# Patient Record
Sex: Female | Born: 1955 | Race: White | Hispanic: No | Marital: Married | Smoking: Never smoker
Health system: Southern US, Community
[De-identification: ages and names within clinical notes are randomized; demographics above are authoritative.]

## PROBLEM LIST (undated history)

## (undated) DIAGNOSIS — IMO0002 Reserved for concepts with insufficient information to code with codable children: Secondary | ICD-10-CM

## (undated) DIAGNOSIS — C541 Malignant neoplasm of endometrium: Secondary | ICD-10-CM

## (undated) DIAGNOSIS — N809 Endometriosis, unspecified: Secondary | ICD-10-CM

## (undated) DIAGNOSIS — K229 Disease of esophagus, unspecified: Secondary | ICD-10-CM

## (undated) DIAGNOSIS — B029 Zoster without complications: Secondary | ICD-10-CM

## (undated) DIAGNOSIS — R87619 Unspecified abnormal cytological findings in specimens from cervix uteri: Secondary | ICD-10-CM

## (undated) DIAGNOSIS — L4050 Arthropathic psoriasis, unspecified: Secondary | ICD-10-CM

## (undated) HISTORY — PX: APPENDECTOMY: SHX54

## (undated) HISTORY — DX: Zoster without complications: B02.9

## (undated) HISTORY — DX: Arthropathic psoriasis, unspecified: L40.50

## (undated) HISTORY — PX: LIVER BIOPSY: SHX301

## (undated) HISTORY — DX: Endometriosis, unspecified: N80.9

## (undated) HISTORY — DX: Unspecified abnormal cytological findings in specimens from cervix uteri: R87.619

## (undated) HISTORY — PX: OTHER SURGICAL HISTORY: SHX169

## (undated) HISTORY — PX: NOSE SURGERY: SHX723

## (undated) HISTORY — PX: EXPLORATORY LAPAROTOMY: SUR591

## (undated) HISTORY — PX: KNEE ARTHROPLASTY: SHX992

## (undated) HISTORY — PX: CATARACT EXTRACTION, BILATERAL: SHX1313

## (undated) HISTORY — DX: Reserved for concepts with insufficient information to code with codable children: IMO0002

## (undated) HISTORY — PX: LAPAROSCOPY: SHX197

## (undated) HISTORY — PX: KNEE SURGERY: SHX244

## (undated) HISTORY — PX: ABDOMINAL HYSTERECTOMY: SHX81

---

## 1998-05-12 ENCOUNTER — Other Ambulatory Visit: Admission: RE | Admit: 1998-05-12 | Discharge: 1998-05-12 | Payer: Self-pay | Admitting: Obstetrics and Gynecology

## 1999-06-13 ENCOUNTER — Encounter: Payer: Self-pay | Admitting: Obstetrics and Gynecology

## 1999-06-13 ENCOUNTER — Encounter: Admission: RE | Admit: 1999-06-13 | Discharge: 1999-06-13 | Payer: Self-pay | Admitting: Obstetrics and Gynecology

## 1999-08-22 ENCOUNTER — Other Ambulatory Visit: Admission: RE | Admit: 1999-08-22 | Discharge: 1999-08-22 | Payer: Self-pay | Admitting: Obstetrics and Gynecology

## 1999-08-26 ENCOUNTER — Encounter: Admission: RE | Admit: 1999-08-26 | Discharge: 1999-08-26 | Payer: Self-pay | Admitting: Obstetrics and Gynecology

## 1999-08-26 ENCOUNTER — Encounter: Payer: Self-pay | Admitting: Obstetrics and Gynecology

## 2000-06-15 ENCOUNTER — Encounter: Payer: Self-pay | Admitting: Obstetrics and Gynecology

## 2000-06-15 ENCOUNTER — Encounter: Admission: RE | Admit: 2000-06-15 | Discharge: 2000-06-15 | Payer: Self-pay | Admitting: Obstetrics and Gynecology

## 2001-09-19 ENCOUNTER — Encounter: Payer: Self-pay | Admitting: Obstetrics and Gynecology

## 2001-09-19 ENCOUNTER — Encounter: Admission: RE | Admit: 2001-09-19 | Discharge: 2001-09-19 | Payer: Self-pay | Admitting: Obstetrics and Gynecology

## 2002-02-06 HISTORY — PX: OTHER SURGICAL HISTORY: SHX169

## 2002-03-14 ENCOUNTER — Encounter: Admission: RE | Admit: 2002-03-14 | Discharge: 2002-03-14 | Payer: Self-pay | Admitting: Orthopedic Surgery

## 2002-03-14 ENCOUNTER — Encounter: Payer: Self-pay | Admitting: Orthopedic Surgery

## 2002-03-15 ENCOUNTER — Encounter: Admission: RE | Admit: 2002-03-15 | Discharge: 2002-03-15 | Payer: Self-pay | Admitting: Orthopedic Surgery

## 2002-03-15 ENCOUNTER — Encounter: Payer: Self-pay | Admitting: Orthopedic Surgery

## 2002-10-24 ENCOUNTER — Encounter: Admission: RE | Admit: 2002-10-24 | Discharge: 2002-10-24 | Payer: Self-pay | Admitting: Obstetrics and Gynecology

## 2002-10-24 ENCOUNTER — Encounter: Payer: Self-pay | Admitting: Obstetrics and Gynecology

## 2003-06-29 ENCOUNTER — Ambulatory Visit (HOSPITAL_COMMUNITY): Admission: RE | Admit: 2003-06-29 | Discharge: 2003-06-29 | Payer: Self-pay | Admitting: Gastroenterology

## 2003-12-02 ENCOUNTER — Ambulatory Visit (HOSPITAL_COMMUNITY): Admission: RE | Admit: 2003-12-02 | Discharge: 2003-12-02 | Payer: Self-pay | Admitting: Obstetrics and Gynecology

## 2003-12-21 ENCOUNTER — Encounter: Admission: RE | Admit: 2003-12-21 | Discharge: 2003-12-21 | Payer: Self-pay | Admitting: Obstetrics and Gynecology

## 2004-12-15 ENCOUNTER — Ambulatory Visit (HOSPITAL_COMMUNITY): Admission: RE | Admit: 2004-12-15 | Discharge: 2004-12-15 | Payer: Self-pay | Admitting: Obstetrics and Gynecology

## 2005-12-26 ENCOUNTER — Ambulatory Visit (HOSPITAL_COMMUNITY): Admission: RE | Admit: 2005-12-26 | Discharge: 2005-12-26 | Payer: Self-pay | Admitting: Interventional Cardiology

## 2006-03-05 ENCOUNTER — Ambulatory Visit (HOSPITAL_COMMUNITY): Admission: RE | Admit: 2006-03-05 | Discharge: 2006-03-05 | Payer: Self-pay | Admitting: Obstetrics and Gynecology

## 2006-08-28 ENCOUNTER — Ambulatory Visit (HOSPITAL_COMMUNITY): Admission: RE | Admit: 2006-08-28 | Discharge: 2006-08-28 | Payer: Self-pay | Admitting: Rheumatology

## 2006-08-28 ENCOUNTER — Encounter (INDEPENDENT_AMBULATORY_CARE_PROVIDER_SITE_OTHER): Payer: Self-pay | Admitting: Interventional Radiology

## 2006-12-31 ENCOUNTER — Ambulatory Visit (HOSPITAL_COMMUNITY): Admission: RE | Admit: 2006-12-31 | Discharge: 2006-12-31 | Payer: Self-pay | Admitting: Obstetrics and Gynecology

## 2008-01-06 ENCOUNTER — Ambulatory Visit (HOSPITAL_COMMUNITY): Admission: RE | Admit: 2008-01-06 | Discharge: 2008-01-06 | Payer: Self-pay | Admitting: Obstetrics and Gynecology

## 2008-11-24 ENCOUNTER — Ambulatory Visit (HOSPITAL_COMMUNITY): Admission: RE | Admit: 2008-11-24 | Discharge: 2008-11-24 | Payer: Self-pay | Admitting: Obstetrics and Gynecology

## 2008-11-24 ENCOUNTER — Encounter: Payer: Self-pay | Admitting: Obstetrics and Gynecology

## 2008-11-24 HISTORY — PX: BILATERAL SALPINGOOPHORECTOMY: SHX1223

## 2009-01-08 ENCOUNTER — Ambulatory Visit (HOSPITAL_COMMUNITY): Admission: RE | Admit: 2009-01-08 | Discharge: 2009-01-08 | Payer: Self-pay | Admitting: Obstetrics and Gynecology

## 2010-01-12 ENCOUNTER — Ambulatory Visit (HOSPITAL_COMMUNITY)
Admission: RE | Admit: 2010-01-12 | Discharge: 2010-01-12 | Payer: Self-pay | Source: Home / Self Care | Attending: Obstetrics and Gynecology | Admitting: Obstetrics and Gynecology

## 2010-02-26 ENCOUNTER — Encounter: Payer: Self-pay | Admitting: Obstetrics and Gynecology

## 2010-05-12 LAB — CBC
HCT: 43.2 % (ref 36.0–46.0)
Hemoglobin: 15 g/dL (ref 12.0–15.0)
MCHC: 34.8 g/dL (ref 30.0–36.0)
MCV: 90.3 fL (ref 78.0–100.0)
Platelets: 181 10*3/uL (ref 150–400)
RBC: 4.78 MIL/uL (ref 3.87–5.11)
RDW: 14.8 % (ref 11.5–15.5)
WBC: 5.9 10*3/uL (ref 4.0–10.5)

## 2010-05-12 LAB — ABO/RH: ABO/RH(D): B NEG

## 2010-05-12 LAB — TYPE AND SCREEN
ABO/RH(D): B NEG
Antibody Screen: NEGATIVE

## 2010-05-12 LAB — COMPREHENSIVE METABOLIC PANEL
ALT: 33 U/L (ref 0–35)
AST: 28 U/L (ref 0–37)
Albumin: 4 g/dL (ref 3.5–5.2)
Alkaline Phosphatase: 90 U/L (ref 39–117)
BUN: 10 mg/dL (ref 6–23)
CO2: 30 mEq/L (ref 19–32)
Calcium: 9.6 mg/dL (ref 8.4–10.5)
Chloride: 106 mEq/L (ref 96–112)
Creatinine, Ser: 0.68 mg/dL (ref 0.4–1.2)
GFR calc Af Amer: >60 mL/min (ref >60)
GFR calc non Af Amer: >60 mL/min (ref >60)
Glucose, Bld: 88 mg/dL (ref 70–99)
Potassium: 4.8 mEq/L (ref 3.5–5.1)
Sodium: 141 mEq/L (ref 135–145)
Total Bilirubin: 0.8 mg/dL (ref 0.3–1.2)
Total Protein: 7.3 g/dL (ref 6.0–8.3)

## 2010-06-06 ENCOUNTER — Emergency Department (HOSPITAL_COMMUNITY): Payer: BC Managed Care – PPO

## 2010-06-06 ENCOUNTER — Emergency Department (HOSPITAL_COMMUNITY)
Admission: EM | Admit: 2010-06-06 | Discharge: 2010-06-06 | Disposition: A | Discharge status: Home / Self Care | Payer: BC Managed Care – PPO | Attending: Emergency Medicine | Admitting: Emergency Medicine

## 2010-06-06 DIAGNOSIS — R079 Chest pain, unspecified: Secondary | ICD-10-CM | POA: Insufficient documentation

## 2010-06-06 DIAGNOSIS — R209 Unspecified disturbances of skin sensation: Secondary | ICD-10-CM | POA: Insufficient documentation

## 2010-06-06 LAB — POCT CARDIAC MARKERS
CKMB, poc: 1.1 ng/mL (ref 1.0–8.0)
Troponin i, poc: <0.05 ng/mL (ref 0.00–0.09)
Troponin i, poc: <0.05 ng/mL (ref 0.00–0.09)

## 2010-06-06 LAB — DIFFERENTIAL
Basophils Relative: 0 % (ref 0–1)
Eosinophils Absolute: 0.1 10*3/uL (ref 0.0–0.7)
Lymphocytes Relative: 35 % (ref 12–46)
Lymphs Abs: 2.5 10*3/uL (ref 0.7–4.0)
Monocytes Absolute: 0.7 10*3/uL (ref 0.1–1.0)
Monocytes Relative: 10 % (ref 3–12)
Neutro Abs: 3.9 10*3/uL (ref 1.7–7.7)
Neutrophils Relative %: 54 % (ref 43–77)

## 2010-06-06 LAB — POCT I-STAT, CHEM 8
BUN: 15 mg/dL (ref 6–23)
Calcium, Ion: 1.13 mmol/L (ref 1.12–1.32)
Chloride: 107 mEq/L (ref 96–112)
Creatinine, Ser: 0.9 mg/dL (ref 0.4–1.2)
Glucose, Bld: 83 mg/dL (ref 70–99)
HCT: 44 % (ref 36.0–46.0)
Hemoglobin: 15 g/dL (ref 12.0–15.0)
Potassium: 4.2 mEq/L (ref 3.5–5.1)
Sodium: 142 mEq/L (ref 135–145)
TCO2: 26 mmol/L (ref 0–100)

## 2010-06-06 LAB — CBC
HCT: 43.3 % (ref 36.0–46.0)
Hemoglobin: 15.1 g/dL — ABNORMAL HIGH (ref 12.0–15.0)
MCH: 31.6 pg (ref 26.0–34.0)
MCV: 90.6 fL (ref 78.0–100.0)
Platelets: 236 10*3/uL (ref 150–400)
RBC: 4.78 MIL/uL (ref 3.87–5.11)
RDW: 15.4 % (ref 11.5–15.5)
WBC: 7.2 10*3/uL (ref 4.0–10.5)

## 2010-06-21 ENCOUNTER — Ambulatory Visit (HOSPITAL_COMMUNITY): Payer: BC Managed Care – PPO | Attending: Family Medicine | Admitting: Radiology

## 2010-06-21 VITALS — Ht 62.0 in | Wt 178.0 lb

## 2010-06-21 DIAGNOSIS — R0609 Other forms of dyspnea: Secondary | ICD-10-CM

## 2010-06-21 DIAGNOSIS — R0602 Shortness of breath: Secondary | ICD-10-CM | POA: Insufficient documentation

## 2010-06-21 DIAGNOSIS — R0989 Other specified symptoms and signs involving the circulatory and respiratory systems: Secondary | ICD-10-CM

## 2010-06-21 DIAGNOSIS — R079 Chest pain, unspecified: Secondary | ICD-10-CM | POA: Insufficient documentation

## 2010-06-21 MED ORDER — TECHNETIUM TC 99M TETROFOSMIN IV KIT
33.0000 | PACK | Freq: Once | INTRAVENOUS | Status: AC | PRN
  Administered 2010-06-21: 33 via INTRAVENOUS

## 2010-06-21 MED ORDER — TECHNETIUM TC 99M TETROFOSMIN IV KIT
11.0000 | PACK | Freq: Once | INTRAVENOUS | Status: AC | PRN
  Administered 2010-06-21: 11 via INTRAVENOUS

## 2010-06-21 NOTE — Progress Notes (Signed)
[facility] SITE 3 NUCLEAR MED [address] [place] [address] [phone]  Cardiology Nuclear Med Study  Cynthia Snyder is a 55 y.o. female [id number] [birth year]   Nuclear Med Background Indication for Stress Test:  Evaluation for Ischemia, [facility] ED on 06/06/10 CP, (-) enzymes History:  No previous documented CAD Cardiac Risk Factors: Family History - CAD and Obesity  Symptoms:  Chest Pain>(L) arm numbness (last episode of chest discomfort has been none since discharge), Diaphoresis, DOE, Nausea and SOB with CP   Nuclear Pre-Procedure Caffeine/Decaff Intake:  None NPO After: 8:30pm   Lungs:  Clear. IV 0.9% NS with Angio Cath:  22g  IV Site: R Antecubital  IV Started by:  Irean Hong, RN  Chest Size (in):  38 Cup Size: DD  Height: 5\' 2"  (1.575 m)  Weight:  178 lb (80.74 kg)  BMI:  Body mass index is 32.56 kg/(m^2). Tech Comments:  Patient c/o heaviness in left leg with walking on the treadmill.    Nuclear Med Study 1 or 2 day study: 1 day  Stress Test Type:  Stress  Reading MD: Marca Ancona, MD  Order Authorizing Provider:  Antony Haste, MD  Resting Radionuclide: Technetium 6m Tetrofosmin  Resting Radionuclide Dose: 11 mCi   Stress Radionuclide:  Technetium 53m Tetrofosmin  Stress Radionuclide Dose: 33 mCi           Stress Protocol Rest HR: 80 Stress HR: 157  Rest BP: 119/86 Stress BP: 159/86 and 154/101  Exercise Time (min): 9:30 METS: 11.1   Predicted Max HR: 165 bpm % Max HR: 95.15 bpm Rate Pressure Product: 21308   Dose of Adenosine (mg):  n/a Dose of Lexiscan: n/a mg  Dose of Atropine (mg): n/a Dose of Dobutamine: n/a mcg/kg/min (at max HR)  Stress Test Technologist: Smiley Houseman, CMA-N  Nuclear Technologist:  Doyne Keel, CNMT     Rest Procedure:  Myocardial perfusion imaging was performed at rest 45 minutes following the intravenous administration of Technetium 24m Tetrofosmin.  Rest ECG: No acute changes.  Stress  Procedure:  The patient exercised for 9:30 on the treadmill utilizing the Bruce protocol.  The patient stopped due to fatigue and (L) leg heaviness.  She denied any chest pain.  There were no significant ST-T wave changes.  Technetium 25m Tetrofosmin was injected at peak exercise and myocardial perfusion imaging was performed after a brief delay.  Stress ECG: No significant change from baseline ECG  QPS Raw Data Images:  Normal; no motion artifact; normal heart/lung ratio. Stress Images:  Normal homogeneous uptake in all areas of the myocardium. Rest Images:  Normal homogeneous uptake in all areas of the myocardium. Subtraction (SDS):  Normal Transient Ischemic Dilatation (Normal <1.22):  0.96 Lung/Heart Ratio (Normal <0.45):  0.41  Quantitative Gated Spect Images QGS EDV:  51 ml QGS ESV:  15 ml QGS cine images:  NL LV Function; NL Wall Motion QGS EF: 71%  Impression Exercise Capacity:  Good exercise capacity. BP Response:  Normal blood pressure response. Clinical Symptoms:  No chest pain. ECG Impression:  No significant ST segment change suggestive of ischemia. Comparison with Prior Nuclear Study: No previous nuclear study performed  Overall Impression:  Normal stress nuclear study.     Cynthia Snyder

## 2010-06-22 NOTE — Progress Notes (Signed)
Copy faxed to Dr. Cyndia Bent.Cynthia Snyder

## 2010-06-24 NOTE — Op Note (Signed)
Cynthia Snyder, Cynthia Snyder                        ACCOUNT NO.:  [id number]   MEDICAL RECORD NO.:  [id number]                   PATIENT TYPE:  AMB   LOCATION:  ENDO                                 FACILITY:  [facility]   PHYSICIAN:  John C. Madilyn Fireman, M.D.                 DATE OF BIRTH:  11/18/1955   DATE OF PROCEDURE:  06/29/2003  DATE OF DISCHARGE:                                 OPERATIVE REPORT   PROCEDURE:  Colonoscopy.   INDICATIONS FOR PROCEDURE:  History of colon polyps in a first-degree  relative and colon cancer in two second-degree relatives.   DESCRIPTION OF PROCEDURE:  The patient was placed in the left lateral  decubitus position and placed on the pulse monitor with continuous low-flow  oxygen delivered by nasal cannula.  She was sedated with 87.5 mcg IV  fentanyl and 8 mg IV Versed.  The Olympus video colonoscope was inserted  into the rectum and advanced to the cecum, confirmed by transillumination of  McBurney's point and visualization of the ileocecal valve and appendiceal  orifice.  Prep was excellent.  The cecum, ascending, transverse, descending,  and sigmoid colon all appeared normal with no masses, polyps, diverticula,  or other mucosal abnormalities.  The rectum likewise appeared normal and  retroflexed view of the anus revealed small internal hemorrhoids.  The scope  was then withdrawn and the patient returned to the recovery room in stable  condition.  She tolerated the procedure well and there were no immediate  complications.   IMPRESSION:  Internal hemorrhoids; otherwise normal study.   PLAN:  Repeat colonoscopy in five years.                                               John C. Madilyn Fireman, M.D.    JCH/MEDQ  D:  06/29/2003  T:  06/29/2003  Job:  [id number]   cc:   Teena Irani. Arlyce Dice, M.D.  [address]  [place]  [place] [address]  Fax: [phone]

## 2010-11-21 LAB — CBC
HCT: 39.9
Hemoglobin: 13.6
MCHC: 34
MCV: 87.9
Platelets: 249
RBC: 4.54
RDW: 17.3 — ABNORMAL HIGH
WBC: 8.4

## 2010-11-21 LAB — PROTIME-INR
INR: 0.9
Prothrombin Time: 12.5

## 2010-12-13 ENCOUNTER — Other Ambulatory Visit: Payer: Self-pay | Admitting: Obstetrics and Gynecology

## 2010-12-13 DIAGNOSIS — Z13820 Encounter for screening for osteoporosis: Secondary | ICD-10-CM

## 2010-12-13 DIAGNOSIS — Z1231 Encounter for screening mammogram for malignant neoplasm of breast: Secondary | ICD-10-CM

## 2011-01-17 ENCOUNTER — Ambulatory Visit (HOSPITAL_COMMUNITY)
Admission: RE | Admit: 2011-01-17 | Discharge: 2011-01-17 | Disposition: A | Discharge status: Home / Self Care | Payer: BC Managed Care – PPO | Source: Ambulatory Visit | Attending: Obstetrics and Gynecology | Admitting: Obstetrics and Gynecology

## 2011-01-17 DIAGNOSIS — Z1231 Encounter for screening mammogram for malignant neoplasm of breast: Secondary | ICD-10-CM

## 2011-01-17 DIAGNOSIS — Z13820 Encounter for screening for osteoporosis: Secondary | ICD-10-CM | POA: Insufficient documentation

## 2011-01-17 DIAGNOSIS — Z780 Asymptomatic menopausal state: Secondary | ICD-10-CM | POA: Insufficient documentation

## 2011-02-03 ENCOUNTER — Encounter: Payer: Self-pay | Admitting: Emergency Medicine

## 2011-02-03 ENCOUNTER — Other Ambulatory Visit: Payer: Self-pay

## 2011-02-03 ENCOUNTER — Emergency Department (HOSPITAL_COMMUNITY)
Admission: EM | Admit: 2011-02-03 | Discharge: 2011-02-03 | Disposition: A | Discharge status: Home / Self Care | Payer: BC Managed Care – PPO | Attending: Emergency Medicine | Admitting: Emergency Medicine

## 2011-02-03 ENCOUNTER — Emergency Department (HOSPITAL_COMMUNITY): Payer: BC Managed Care – PPO

## 2011-02-03 DIAGNOSIS — R61 Generalized hyperhidrosis: Secondary | ICD-10-CM | POA: Insufficient documentation

## 2011-02-03 DIAGNOSIS — R079 Chest pain, unspecified: Secondary | ICD-10-CM | POA: Insufficient documentation

## 2011-02-03 DIAGNOSIS — Z79899 Other long term (current) drug therapy: Secondary | ICD-10-CM | POA: Insufficient documentation

## 2011-02-03 DIAGNOSIS — R1110 Vomiting, unspecified: Secondary | ICD-10-CM | POA: Insufficient documentation

## 2011-02-03 DIAGNOSIS — R1013 Epigastric pain: Secondary | ICD-10-CM

## 2011-02-03 DIAGNOSIS — K3189 Other diseases of stomach and duodenum: Secondary | ICD-10-CM | POA: Insufficient documentation

## 2011-02-03 HISTORY — DX: Malignant neoplasm of endometrium: C54.1

## 2011-02-03 HISTORY — DX: Disease of esophagus, unspecified: K22.9

## 2011-02-03 LAB — POCT I-STAT, CHEM 8
BUN: 18 mg/dL (ref 6–23)
Calcium, Ion: 1.21 mmol/L (ref 1.12–1.32)
Chloride: 106 mEq/L (ref 96–112)
Creatinine, Ser: 0.9 mg/dL (ref 0.50–1.10)
Glucose, Bld: 92 mg/dL (ref 70–99)
HCT: 47 % — ABNORMAL HIGH (ref 36.0–46.0)
Hemoglobin: 16 g/dL — ABNORMAL HIGH (ref 12.0–15.0)
Potassium: 4.3 mEq/L (ref 3.5–5.1)
Sodium: 144 mEq/L (ref 135–145)
TCO2: 27 mmol/L (ref 0–100)

## 2011-02-03 LAB — DIFFERENTIAL
Basophils Absolute: 0 10*3/uL (ref 0.0–0.1)
Basophils Relative: 0 % (ref 0–1)
Monocytes Absolute: 0.6 10*3/uL (ref 0.1–1.0)
Neutro Abs: 7 10*3/uL (ref 1.7–7.7)

## 2011-02-03 LAB — POCT I-STAT TROPONIN I
Troponin i, poc: 0 ng/mL (ref 0.00–0.08)
Troponin i, poc: 0 ng/mL (ref 0.00–0.08)

## 2011-02-03 LAB — CBC
HCT: 44.6 % (ref 36.0–46.0)
MCHC: 34.5 g/dL (ref 30.0–36.0)
Platelets: 201 10*3/uL (ref 150–400)
RDW: 15.5 % (ref 11.5–15.5)
WBC: 9 10*3/uL (ref 4.0–10.5)

## 2011-02-03 MED ORDER — GI COCKTAIL ~~LOC~~
30.0000 mL | Freq: Once | ORAL | Status: AC
  Administered 2011-02-03: 30 mL via ORAL
  Filled 2011-02-03: qty 30

## 2011-02-03 MED ORDER — MORPHINE SULFATE 4 MG/ML IJ SOLN
4.0000 mg | Freq: Once | INTRAMUSCULAR | Status: AC
  Administered 2011-02-03: 4 mg via INTRAVENOUS
  Filled 2011-02-03: qty 1

## 2011-02-03 MED ORDER — SODIUM CHLORIDE 0.9 % IV SOLN
Freq: Once | INTRAVENOUS | Status: AC
  Administered 2011-02-03 (×2): via INTRAVENOUS

## 2011-02-03 MED ORDER — ONDANSETRON HCL 4 MG/2ML IJ SOLN
4.0000 mg | Freq: Once | INTRAMUSCULAR | Status: AC
  Administered 2011-02-03: 4 mg via INTRAVENOUS

## 2011-02-03 MED ORDER — ONDANSETRON HCL 4 MG/2ML IJ SOLN
INTRAMUSCULAR | Status: AC
  Administered 2011-02-03: 4 mg via INTRAVENOUS
  Filled 2011-02-03: qty 2

## 2011-02-03 MED ORDER — ONDANSETRON HCL 4 MG/2ML IJ SOLN
4.0000 mg | Freq: Once | INTRAMUSCULAR | Status: DC
  Filled 2011-02-03: qty 2

## 2011-02-03 MED ORDER — OMEPRAZOLE 20 MG PO CPDR: DELAYED_RELEASE_CAPSULE | ORAL | Status: DC

## 2011-02-03 NOTE — ED Notes (Signed)
Pt is completely pain free at this time with no N/V/D.  BRAT diet recommended and explained to pt with verbal understanding expressed.

## 2011-02-03 NOTE — ED Provider Notes (Signed)
History     CSN: [id number]  Arrival date & time 02/03/11  1048   First MD Initiated Contact with Patient 02/03/11 1109      Chief Complaint  Patient presents with  . Emesis    (Consider location/radiation/quality/duration/timing/severity/associated sxs/prior treatment) Patient is a 55 y.o. female presenting with vomiting and chest pain. The history is provided by the patient.  Emesis  This is a new problem. The current episode started 3 to 5 hours ago. The problem occurs 2 to 4 times per day. The problem has not changed since onset.There has been no fever. Pertinent negatives include no abdominal pain.  Chest Pain The chest pain began 3 - 5 hours ago. Chest pain occurs constantly. The chest pain is unchanged. Associated with: Sharp right sided chest pain that radiates to lateral chest wall and under breast. The pain is currently at 9/10. The quality of the pain is described as sharp. Primary symptoms include vomiting. Pertinent negatives for primary symptoms include no abdominal pain.  Associated symptoms include diaphoresis.  Her family medical history is significant for CAD in family.     Past Medical History  Diagnosis Date  . Endometrial cancer   . Esophagus disorder     Past Surgical History  Procedure Date  . Appendectomy   . Abdominal hysterectomy   . Knee surgery   . Exploratory laparotomy     History reviewed. No pertinent family history.  History  Substance Use Topics  . Smoking status: Never Smoker   . Smokeless tobacco: Never Used  . Alcohol Use: No    OB History    Grav Para Term Preterm Abortions TAB SAB Ect Mult Living                  Review of Systems  Constitutional: Positive for diaphoresis.  HENT: Negative.   Eyes: Negative.   Respiratory: Negative.   Cardiovascular: Positive for chest pain. Negative for leg swelling.  Gastrointestinal: Positive for vomiting. Negative for abdominal pain.  Musculoskeletal: Negative.   Skin: Negative.      Allergies  Review of patient's allergies indicates no known allergies.  Home Medications   Current Outpatient Rx  Name Route Sig Dispense Refill  . CALCIUM CARBONATE 1250 MG PO TABS Oral Take 1 tablet by mouth daily.      . CYCLOBENZAPRINE HCL 10 MG PO TABS Oral Take 10 mg by mouth at bedtime as needed. For sleep     . ETANERCEPT 50 MG/ML [place] SOLN Subcutaneous Inject 50 mg into the skin once a week. On wednesday     . FOLIC ACID 400 MCG PO TABS Oral Take 400 mcg by mouth daily.      . IBUPROFEN 200 MG PO TABS Oral Take 400 mg by mouth every 6 (six) hours as needed. For pain     . METHOTREXATE (ANTI-RHEUMATIC) 2.5 MG PO TABS Oral Take 7.5-10 mg by mouth once a week. Pt takes 4 tablets on Saturday and 3 tablets on sunday     . ADULT MULTIVITAMIN W/MINERALS CH Oral Take 1 tablet by mouth daily.      Marland Kitchen VITAMIN D (ERGOCALCIFEROL) 50000 UNITS PO CAPS Oral Take 50,000 Units by mouth once a week. On saturday       BP 132/99  Temp(Src) 97.7 F (36.5 C) (Oral)  Resp 16  SpO2 99%  Physical Exam  Constitutional: She appears well-developed and well-nourished.       Patient appears uncomfortable.  HENT:  Head: Normocephalic.  Neck: Normal range of motion. Neck supple.  Cardiovascular: Normal rate and regular rhythm.   Pulmonary/Chest: Effort normal and breath sounds normal.  Abdominal: Soft. Bowel sounds are normal. There is no tenderness. There is no rebound and no guarding.  Musculoskeletal: Normal range of motion.  Neurological: She is alert. No cranial nerve deficit.  Skin: Skin is warm and dry. No rash noted.  Psychiatric: She has a normal mood and affect.    ED Course  Procedures (including critical care time)  Labs Reviewed - No data to display No results found.   No diagnosis found.    MDM   Date: 02/03/2011  Rate: 98  Rhythm: normal sinus rhythm  QRS Axis: normal  Intervals: normal  ST/T Wave abnormalities: normal  Conduction Disutrbances:none  Narrative  Interpretation:   Old EKG Reviewed: unchanged  Patient's pain was completely relieved with GI Cocktail. She has been pain free since. Second Troponin negative. Will discharge home.   Rodena Medin, PA 02/03/11 1520

## 2011-02-03 NOTE — ED Notes (Signed)
Received pt from home with c/o pain in diaphragm and feeling like something is stuck in her throat. Pt actively vomiting in room. Pt given 4mg  of Zofran by EMS.

## 2011-02-03 NOTE — ED Notes (Signed)
Pt reports complete relief.  Denies N/V at this time.  No verbal complaints at this time.

## 2011-02-03 NOTE — ED Notes (Addendum)
Pt reports complete relief after GI cocktail. She denies N/V/D at this time.  NAD

## 2011-02-03 NOTE — ED Notes (Signed)
Pt has no verbal complaints at this time and denies pain/discomfort.

## 2011-02-03 NOTE — ED Notes (Signed)
Pt semi fowlers.  VSS.  Family at [facility].

## 2011-02-03 NOTE — ED Notes (Signed)
IV access discontinued with catheter intact.  No redness or bleeding at site.  Pt denies pain/discomfort at this time.

## 2011-02-03 NOTE — ED Notes (Signed)
MD at bedside. 

## 2011-02-07 NOTE — ED Provider Notes (Signed)
Medical screening examination/treatment/procedure(s) were performed by non-physician practitioner and as supervising physician I was immediately available for consultation/collaboration.  Raeford Razor, MD 02/07/11 2050

## 2011-02-21 ENCOUNTER — Other Ambulatory Visit: Payer: Self-pay | Admitting: Otolaryngology

## 2011-02-21 DIAGNOSIS — R1314 Dysphagia, pharyngoesophageal phase: Secondary | ICD-10-CM

## 2011-02-21 DIAGNOSIS — R079 Chest pain, unspecified: Secondary | ICD-10-CM

## 2011-02-24 ENCOUNTER — Ambulatory Visit
Admission: RE | Admit: 2011-02-24 | Discharge: 2011-02-24 | Disposition: A | Discharge status: Home / Self Care | Payer: BC Managed Care – PPO | Source: Ambulatory Visit | Attending: Otolaryngology | Admitting: Otolaryngology

## 2011-02-24 DIAGNOSIS — R1314 Dysphagia, pharyngoesophageal phase: Secondary | ICD-10-CM

## 2011-02-24 DIAGNOSIS — R079 Chest pain, unspecified: Secondary | ICD-10-CM

## 2012-01-29 DIAGNOSIS — M1990 Unspecified osteoarthritis, unspecified site: Secondary | ICD-10-CM | POA: Insufficient documentation

## 2012-02-29 ENCOUNTER — Other Ambulatory Visit: Payer: Self-pay | Admitting: Obstetrics and Gynecology

## 2012-02-29 DIAGNOSIS — Z1231 Encounter for screening mammogram for malignant neoplasm of breast: Secondary | ICD-10-CM

## 2012-03-07 ENCOUNTER — Ambulatory Visit (HOSPITAL_COMMUNITY)
Admission: RE | Admit: 2012-03-07 | Discharge: 2012-03-07 | Disposition: A | Discharge status: Home / Self Care | Payer: BC Managed Care – PPO | Source: Ambulatory Visit | Attending: Obstetrics and Gynecology | Admitting: Obstetrics and Gynecology

## 2012-03-07 DIAGNOSIS — Z1231 Encounter for screening mammogram for malignant neoplasm of breast: Secondary | ICD-10-CM | POA: Insufficient documentation

## 2012-06-28 ENCOUNTER — Ambulatory Visit (INDEPENDENT_AMBULATORY_CARE_PROVIDER_SITE_OTHER): Payer: BC Managed Care – PPO | Admitting: Obstetrics and Gynecology

## 2012-06-28 ENCOUNTER — Encounter: Payer: Self-pay | Admitting: Obstetrics and Gynecology

## 2012-06-28 VITALS — BP 118/78 | Ht 61.75 in | Wt 183.0 lb

## 2012-06-28 DIAGNOSIS — Z01419 Encounter for gynecological examination (general) (routine) without abnormal findings: Secondary | ICD-10-CM

## 2012-06-28 DIAGNOSIS — Z0000 Encounter for general adult medical examination without abnormal findings: Secondary | ICD-10-CM

## 2012-06-28 LAB — POCT URINALYSIS DIPSTICK
Bilirubin, UA: NEGATIVE
Blood, UA: NEGATIVE
Glucose, UA: NEGATIVE
Ketones, UA: NEGATIVE
Urobilinogen, UA: NEGATIVE

## 2012-06-28 LAB — LIPID PANEL
Cholesterol: 167 mg/dL (ref 0–200)
HDL: 57 mg/dL (ref >39)
Total CHOL/HDL Ratio: 2.9 Ratio
Triglycerides: 89 mg/dL (ref <150)
VLDL: 18 mg/dL (ref 0–40)

## 2012-06-28 MED ORDER — ESTRADIOL 10 MCG VA TABS: 1.0000 | ORAL_TABLET | VAGINAL | Status: DC

## 2012-06-28 NOTE — Progress Notes (Signed)
57 y.o.   Married    Caucasian   female   G2P2002   here for annual exam.  Using vagifem once or twice a week.  Sex is infrequent but wants to continue vagirem  Patient's last menstrual period was 02/06/1982.          Sexually active: yes  The current method of family planning is status post hysterectomy and post menopausal status.    Exercising: walking 2 times a week when able Last mammogram:  03/07/12 normal Last pap smear:2001 History of abnormal pap: age 65 Smoking:never Alcohol: occ glass of wine Last colonoscopy:09/2008 polyps, repeat in 5 years Last Bone Density: 01/17/11 osteopenia  Last tetanus shot: less than 10 years Last cholesterol check: 2010  Hgb:       14.7         Urine:neg   Family History  Problem Relation Age of Onset  . Heart disease Mother   . Hypertension Mother   . Stroke Mother   . Heart disease Father   . Heart disease Brother   . Cancer Paternal Aunt     ovarian    There are no active problems to display for this patient.   Past Medical History  Diagnosis Date  . Endometrial cancer   . Esophagus disorder   . Abnormal Pap smear     many yrs ago  . Endometriosis     Past Surgical History  Procedure Laterality Date  . Appendectomy    . Knee surgery    . Exploratory laparotomy    . Bilateral salpingoophorectomy Right 11/24/08    lysis of adhesion  . Abdominal hysterectomy      age 1  . Laparoscopy      age 32  . Jaw bone graft  2004  . Liver biopsy      mild steatosis    Allergies: Review of patient's allergies indicates no known allergies.  Current Outpatient Prescriptions  Medication Sig Dispense Refill  . clonazePAM (KLONOPIN) 0.5 MG tablet Take 0.5 mg by mouth at bedtime as needed for anxiety.      . Estradiol (VAGIFEM) 10 MCG TABS Place vaginally 2 (two) times a week.      . etanercept (ENBREL) 50 MG/ML injection Inject 50 mg into the skin once a week. On wednesday       . folic acid (FOLVITE) 400 MCG tablet Take 400 mcg by  mouth daily.        Marland Kitchen ibuprofen (ADVIL,MOTRIN) 200 MG tablet Take 400 mg by mouth every 6 (six) hours as needed. For pain       . methotrexate (RHEUMATREX) 2.5 MG tablet 2.5 mg 2 (two) times a week.       . Multiple Vitamin (MULITIVITAMIN WITH MINERALS) TABS Take 1 tablet by mouth daily.        . Multiple Vitamins-Calcium (VIACTIV MULTI-VITAMIN) CHEW Chew by mouth daily.      . Vitamin D, Ergocalciferol, (DRISDOL) 50000 UNITS CAPS Take 50,000 Units by mouth every 14 (fourteen) days. On saturday      . omeprazole (PRILOSEC) 20 MG capsule Twice daily for 3 days, then once daily for one week.  15 capsule  0   No current facility-administered medications for this visit.    ROS: Pertinent items are noted in HPI.  Social Hx:  Married, two children, works as a Diplomatic Services operational officer, Psychologist, forensic for elderly parents in [place]  Exam:    BP 118/78  Ht 5' 1.75" (1.568 m)  Wt  183 lb (83.008 kg)  BMI 33.76 kg/m2  LMP 02/06/1982  Ht stable and wt down 2 pounds since last year Wt Readings from Last 3 Encounters:  06/28/12 183 lb (83.008 kg)  06/21/10 178 lb (80.74 kg)     Ht Readings from Last 3 Encounters:  06/28/12 5' 1.75" (1.568 m)  06/21/10 5\' 2"  (1.575 m)    General appearance: alert, cooperative and appears stated age Head: Normocephalic, without obvious abnormality, atraumatic Neck: no adenopathy, supple, symmetrical, trachea midline and thyroid not enlarged, symmetric, no tenderness/mass/nodules Lungs: clear to auscultation bilaterally Breasts: Inspection negative, No nipple retraction or dimpling, No nipple discharge or bleeding, No axillary or supraclavicular adenopathy, Normal to palpation without dominant masses Heart: regular rate and rhythm Abdomen: soft, non-tender; bowel sounds normal; no masses,  no organomegaly Extremities: extremities normal, atraumatic, no cyanosis or edema Skin: Skin color, texture, turgor normal. No rashes or lesions Lymph nodes: Cervical, supraclavicular, and  axillary nodes normal. No abnormal inguinal nodes palpated Neurologic: Grossly normal   Pelvic: External genitalia:  no lesions              Urethra:  normal appearing urethra with no masses, tenderness or lesions              Bartholins and Skenes: normal                 Vagina: normal appearing vagina with normal color and discharge, no lesions              Cervix: absent              Pap taken: no        Bimanual Exam:  Uterus:  absent                                      Adnexa: surgically absent;  No masses or tenderness                                      Rectovaginal: Confirms                                      Anus:  normal sphincter tone, no lesions  A: normal menopausal exam, no HRT     S/p TAH for endo, the R-BSO and LOA for pain     Psoriatic arthritis, freq steroid use     P: mammogram counseled on breast self exam, mammography screening, adequate intake of calcium and vitamin D, diet and exercise return annually or prn     An After Visit Summary was printed and given to the patient.

## 2012-06-28 NOTE — Patient Instructions (Addendum)

## 2012-08-26 ENCOUNTER — Other Ambulatory Visit: Payer: Self-pay | Admitting: Orthopedic Surgery

## 2012-08-26 DIAGNOSIS — M542 Cervicalgia: Secondary | ICD-10-CM

## 2012-08-31 ENCOUNTER — Ambulatory Visit
Admission: RE | Admit: 2012-08-31 | Discharge: 2012-08-31 | Disposition: A | Discharge status: Home / Self Care | Payer: BC Managed Care – PPO | Source: Ambulatory Visit | Attending: Orthopedic Surgery | Admitting: Orthopedic Surgery

## 2012-08-31 DIAGNOSIS — M542 Cervicalgia: Secondary | ICD-10-CM

## 2012-09-30 ENCOUNTER — Other Ambulatory Visit: Payer: Self-pay | Admitting: Obstetrics and Gynecology

## 2013-02-04 ENCOUNTER — Telehealth: Payer: Self-pay | Admitting: Nurse Practitioner

## 2013-02-04 NOTE — Telephone Encounter (Signed)
Thank you for setting up this appointment for me to see the patient.

## 2013-02-04 NOTE — Telephone Encounter (Signed)
Message left to return call to Cynthia Snyder at [phone].    

## 2013-02-04 NOTE — Telephone Encounter (Signed)
Spoke with patient. She is having L Breast Pain, has been going on for about a week. States it is "achey". Has had this issue prior and was Dx with a pulled muscle, patient thought this may be the same, but it has not improved with time. Offered office visit today, patient declines. States her first available time to come in will be 02/10/13. She previously saw Dr. Tresa Res. Patient scheduled for breast check with Dr. Edward Jolly for 02/10/13.  Patient agreeable and advised to call back if changes her mind for earlier appointment. Patient agreeable to plan.

## 2013-02-04 NOTE — Telephone Encounter (Signed)
Pt says her left breast has been hurting. Please call to advise.

## 2013-02-10 ENCOUNTER — Ambulatory Visit (INDEPENDENT_AMBULATORY_CARE_PROVIDER_SITE_OTHER): Payer: BC Managed Care – PPO | Admitting: Obstetrics and Gynecology

## 2013-02-10 ENCOUNTER — Encounter: Payer: Self-pay | Admitting: Obstetrics and Gynecology

## 2013-02-10 VITALS — BP 126/88 | HR 84 | Ht 61.75 in | Wt 182.0 lb

## 2013-02-10 DIAGNOSIS — N644 Mastodynia: Secondary | ICD-10-CM

## 2013-02-10 NOTE — Patient Instructions (Signed)
Breast Tenderness Breast tenderness is a common problem for women of all ages. Breast tenderness may cause mild discomfort to severe pain. It has a variety of causes. Your health care provider will find out the likely cause of your breast tenderness by examining your breasts, asking you about symptoms, and ordering some tests. Breast tenderness usually does not mean you have breast cancer. HOME CARE INSTRUCTIONS  Breast tenderness often can be handled at home. You can try:  Getting fitted for a new bra that provides more support, especially during exercise.  Wearing a more supportive bra or sports bra while sleeping when your breasts are very tender.  If you have a breast injury, apply ice to the area:  Put ice in a plastic bag.  Place a towel between your skin and the bag.  Leave the ice on for 20 minutes, 2 3 times a day.  If your breasts are too full of milk as a result of breastfeeding, try:  Expressing milk either by hand or with a breast pump.  Applying a warm compress to the breasts for relief.  Taking over-the-counter pain relievers, if approved by your health care provider.  Taking other medicines that your health care provider prescribes. These may include antibiotic medicines or birth control pills. Over the long term, your breast tenderness might be eased if you:  Cut down on caffeine.  Reduce the amount of fat in your diet. Keep a log of the days and times when your breasts are most tender. This will help you and your health care provider find the cause of the tenderness and how to relieve it. Also, learn how to do breast exams at home. This will help you notice if you have an unusual growth or lump that could cause tenderness. SEEK MEDICAL CARE IF:   Any part of your breast is hard, red, and hot to the touch. This could be a sign of infection.  Fluid is coming out of your nipples (and you are not breastfeeding). Especially watch for blood or pus.  You have a fever  as well as breast tenderness.  You have a new or painful lump in your breast that remains after your menstrual period ends.  You have tried to take care of the pain at home, but it has not gone away.  Your breast pain is getting worse, or the pain is making it hard to do the things you usually do during your day. Document Released: 01/06/2008 Document Revised: 09/25/2012 Document Reviewed: 08/22/2012 [employer] Patient Information 2014 [employer].  

## 2013-02-10 NOTE — Progress Notes (Signed)
Patient ID: Cynthia Snyder, female   DOB: Dec 17, 1955, 58 y.o.   MRN: [id number] GYNECOLOGY PROBLEM VISIT  PCP:   Anastasia Pall, MD  Referring provider:   HPI: 58 y.o.   Married  Caucasian  female   G2P2002 with Patient's last menstrual period was 02/06/1982.   here for   Left breast tenderness for 2 1/2 - 3 weeks. Lots of caffeine and chocolates.  No trauma. No excessive exercise. Doing self breast exam, feels tender but no lumps.   Advil helping some. Taking 200 mg at a time.  Last mammogram in January 2014 was normal.  On Vagifem vaginal estrogen.   Going to [place].     GYNECOLOGIC HISTORY: Patient's last menstrual period was 02/06/1982. Sexually active:  yes Partner preference: female Contraception:   TAH Menopausal hormone therapy: Vagifem DES exposure:   no Blood transfusions:  no  Sexually transmitted diseases:   no GYN Procedures:  TAH, Laparoscopy with BSO 2010, laparoscopy 2004 Mammogram: 03-07-12 wnl                Pap:  2001 wnl   History of abnormal pap smear:  At age 58   OB History   Grav Para Term Preterm Abortions TAB SAB Ect Mult Living   2 2 2       2          Family History  Problem Relation Age of Onset  . Heart disease Mother   . Hypertension Mother   . Stroke Mother   . Heart disease Father   . Heart disease Brother   . Cancer Paternal Aunt     ovarian    There are no active problems to display for this patient.   Past Medical History  Diagnosis Date  . Endometrial cancer   . Esophagus disorder   . Abnormal Pap smear     many yrs ago  . Endometriosis     Past Surgical History  Procedure Laterality Date  . Appendectomy    . Knee surgery    . Exploratory laparotomy    . Bilateral salpingoophorectomy Right 11/24/08    lysis of adhesion  . Abdominal hysterectomy      age 33  . Laparoscopy      age 53  . Jaw bone graft  2004  . Liver biopsy      mild steatosis    ALLERGIES: Review of patient's allergies indicates no  known allergies.  Current Outpatient Prescriptions  Medication Sig Dispense Refill  . cyclobenzaprine (FLEXERIL) 10 MG tablet Take 10 mg by mouth as needed for muscle spasms.      . Estradiol (VAGIFEM) 10 MCG TABS Place 1 tablet (10 mcg total) vaginally 2 (two) times a week.  8 tablet  12  . etanercept (ENBREL) 50 MG/ML injection Inject 50 mg into the skin once a week. On wednesday       . ibuprofen (ADVIL,MOTRIN) 200 MG tablet Take 400 mg by mouth every 6 (six) hours as needed. For pain       . methotrexate (RHEUMATREX) 2.5 MG tablet 2.5 mg 2 (two) times a week.       . Multiple Vitamin (MULITIVITAMIN WITH MINERALS) TABS Take 1 tablet by mouth daily.        . Multiple Vitamins-Calcium (VIACTIV MULTI-VITAMIN) CHEW Chew by mouth daily.      . Vitamin D, Ergocalciferol, (DRISDOL) 50000 UNITS CAPS capsule Take 1 capsule (50,000 Units total) by mouth every 14 (fourteen) days.  26 capsule  3   No current facility-administered medications for this visit.     ROS:  Pertinent items are noted in HPI.  SOCIAL HISTORY:    PHYSICAL EXAMINATION:    BP 126/88  Pulse 84  Ht 5' 1.75" (1.568 m)  Wt 182 lb (82.555 kg)  BMI 33.58 kg/m2  LMP 02/06/1982   Wt Readings from Last 3 Encounters:  02/10/13 182 lb (82.555 kg)  06/28/12 183 lb (83.008 kg)  06/21/10 178 lb (80.74 kg)     Ht Readings from Last 3 Encounters:  02/10/13 5' 1.75" (1.568 m)  06/28/12 5' 1.75" (1.568 m)  06/21/10 5\' 2"  (1.575 m)    General appearance: alert, cooperative and appears stated age Breasts - No dominant masses, skin retractions, nipple discharge or axillary adenopathy.  Neurologic: Grossly normal    ASSESSMENT  Left mastalgia. Caffeine use.    PLAN  Counseled on mastalgia. Diagnostic bilateral mammogram and left breast ultrasound. Try Vitamin E 400 IU daily to treat breast pain. Return prn.  15 minutes face to face time of which over 50% was spent in counseling.    An After Visit Summary was printed  and given to the patient.

## 2013-02-10 NOTE — Progress Notes (Signed)
Diag MMG scheduled for 03-10-13 at 0945 at [facility].  Due to patient's scheduling limitations, she declined appointment until first week of February.

## 2013-03-10 ENCOUNTER — Ambulatory Visit
Admission: RE | Admit: 2013-03-10 | Discharge: 2013-03-10 | Disposition: A | Discharge status: Home / Self Care | Payer: BC Managed Care – PPO | Source: Ambulatory Visit | Attending: Obstetrics and Gynecology | Admitting: Obstetrics and Gynecology

## 2013-03-10 DIAGNOSIS — N644 Mastodynia: Secondary | ICD-10-CM

## 2013-10-20 ENCOUNTER — Other Ambulatory Visit: Payer: Self-pay

## 2013-10-20 NOTE — Telephone Encounter (Signed)
lmom to contact office

## 2013-10-20 NOTE — Telephone Encounter (Signed)
Last AEX: 06/28/12 Last refill:09/30/12 #26 X 3 Current AEX:NS  Please advise

## 2013-10-22 NOTE — Telephone Encounter (Signed)
Appt made. I let pt know we will get bloodwork on that day to check her Vitamin D level. Then we will know if a rx is needed. Pt voiced understanding and agreed

## 2013-11-25 ENCOUNTER — Other Ambulatory Visit: Payer: Self-pay | Admitting: Certified Nurse Midwife

## 2013-11-25 ENCOUNTER — Telehealth: Payer: Self-pay | Admitting: Nurse Practitioner

## 2013-11-25 ENCOUNTER — Ambulatory Visit (INDEPENDENT_AMBULATORY_CARE_PROVIDER_SITE_OTHER): Payer: BC Managed Care – PPO | Admitting: Certified Nurse Midwife

## 2013-11-25 ENCOUNTER — Encounter: Payer: Self-pay | Admitting: Certified Nurse Midwife

## 2013-11-25 VITALS — BP 122/90 | HR 72 | Resp 18 | Ht 62.0 in | Wt 187.0 lb

## 2013-11-25 DIAGNOSIS — Z0000 Encounter for general adult medical examination without abnormal findings: Secondary | ICD-10-CM

## 2013-11-25 DIAGNOSIS — Z01419 Encounter for gynecological examination (general) (routine) without abnormal findings: Secondary | ICD-10-CM

## 2013-11-25 LAB — POCT URINALYSIS DIPSTICK
Bilirubin, UA: NEGATIVE
Blood, UA: NEGATIVE
Glucose, UA: NEGATIVE
Ketones, UA: NEGATIVE
NITRITE UA: NEGATIVE
PROTEIN UA: NEGATIVE
Urobilinogen, UA: NEGATIVE
pH, UA: 5

## 2013-11-25 LAB — HEMOGLOBIN, FINGERSTICK: HEMOGLOBIN, FINGERSTICK: 14.4 g/dL (ref 12.0–16.0)

## 2013-11-25 LAB — HEMOGLOBIN A1C
Hgb A1c MFr Bld: 5.2 % (ref <5.7)
Mean Plasma Glucose: 103 mg/dL (ref <117)

## 2013-11-25 LAB — TSH: TSH: 2.298 u[IU]/mL (ref 0.350–4.500)

## 2013-11-25 NOTE — Telephone Encounter (Signed)
Patient was here this morning.  Do you want to add Vitamin D to her labs? And then see if she needs Rx ?  Please advise.

## 2013-11-25 NOTE — Telephone Encounter (Signed)
Yes

## 2013-11-25 NOTE — Progress Notes (Signed)
58 y.o. G27P2002 Married Caucasian Fe here for annual exam.  Menopausal no HRT. Uses Vagifem for dryness sporadically. Denies vaginal bleeding or symptoms today. Some urinary urgency, but frequency or pain. Sees Rheumatology for RA management and PCP aex, prn. No other health issues today. Busy with Grandchildren!  Patient's last menstrual period was 02/06/1982.          Sexually active: Yes.    The current method of family planning is status post hysterectomy.    Exercising: Yes.    Walking 2 days weekly  Smoker:  no  Health Maintenance: Pap:  08/1999 WNL MMG: 03/10/13 BIRADS1: Neg density B Colonoscopy: 2010 = Polyps. Repeat in 5 years  Scheduled  BMD:  01/2011 TDaP:  Updated 10/14 at [facility] Labs: here UA:WBC=Small   reports that she has never smoked. She has never used smokeless tobacco. She reports that she drinks about .6 ounces of alcohol per week. She reports that she does not use illicit drugs.  Past Medical History  Diagnosis Date  . Endometrial cancer   . Esophagus disorder   . Abnormal Pap smear     many yrs ago  . Endometriosis     Past Surgical History  Procedure Laterality Date  . Appendectomy    . Knee surgery    . Exploratory laparotomy    . Bilateral salpingoophorectomy Right 11/24/08    lysis of adhesion  . Abdominal hysterectomy      age 78  . Laparoscopy      age 11  . Jaw bone graft  2004  . Liver biopsy      mild steatosis    Current Outpatient Prescriptions  Medication Sig Dispense Refill  . Calcium 1500 MG tablet Take by mouth.      . Cholecalciferol (VITAMIN D) 400 UNITS capsule Take 50,000 Units by mouth.      . etanercept (ENBREL) 50 MG/ML injection Inject 50 mg into the skin once a week. On wednesday       . folic acid (FOLVITE) 063 MCG tablet Take 800 mcg by mouth.      Marland Kitchen ibuprofen (ADVIL,MOTRIN) 200 MG tablet Take 400 mg by mouth every 6 (six) hours as needed. For pain       . methotrexate (RHEUMATREX) 2.5 MG tablet 2.5 mg 2 (two) times a  week.       . Multiple Vitamin (MULITIVITAMIN WITH MINERALS) TABS Take 1 tablet by mouth daily.        . Multiple Vitamins-Calcium (VIACTIV MULTI-VITAMIN) CHEW Chew by mouth daily.      Marland Kitchen omeprazole (PRILOSEC) 40 MG capsule Take 40 mg by mouth daily as needed.       No current facility-administered medications for this visit.    Family History  Problem Relation Age of Onset  . Heart disease Mother   . Hypertension Mother   . Stroke Mother   . Diabetes Mother   . Heart disease Father   . Heart disease Brother   . Cancer Paternal Aunt     ovarian    ROS:  Pertinent items are noted in HPI.  Otherwise, a comprehensive ROS was negative.  Exam:   BP 122/90  Pulse 72  Resp 18  Ht 5\' 2"  (1.575 m)  Wt 187 lb (84.823 kg)  BMI 34.19 kg/m2  LMP 02/06/1982 Height: 5\' 2"  (157.5 cm)  Ht Readings from Last 3 Encounters:  11/25/13 5\' 2"  (1.575 m)  02/10/13 5' 1.75" (1.568 m)  06/28/12 5' 1.75" (1.568  m)    General appearance: alert, cooperative and appears stated age Head: Normocephalic, without obvious abnormality, atraumatic Neck: no adenopathy, supple, symmetrical, trachea midline and thyroid normal to inspection and palpation Lungs: clear to auscultation bilaterally Breasts: normal appearance, no masses or tenderness, No nipple retraction or dimpling, No nipple discharge or bleeding, No axillary or supraclavicular adenopathy Heart: regular rate and rhythm Abdomen: soft, non-tender; no masses,  no organomegaly, negative suprapubic Extremities: extremities normal, atraumatic, no cyanosis or edema Skin: Skin color, texture, turgor normal. No rashes or lesions Lymph nodes: Cervical, supraclavicular, and axillary nodes normal. No abnormal inguinal nodes palpated Neurologic: Grossly normal   Pelvic: External genitalia:  no lesions              Urethra:  normal appearing urethra with no masses, tenderness or lesions  Bladder and urethral meatus non tender(meatus prominent)               Bartholin's and Skene's: normal                 Vagina: normal appearing vagina with normal color and discharge, no lesions              Cervix: absent              Pap taken: No. Bimanual Exam:  Uterus:  uterus absent              Adnexa: no mass, fullness, tenderness and adnexa absent bilateral               Rectovaginal: Confirms               Anus:  normal sphincter tone, no lesions  A:  Well Woman with normal exam  Menopausal no HRT S/P TAH BSO for endometriosis  Atrophic vaginitis uses Vagifem weekly sporadic  R/O UTI  P:   Reviewed health and wellness pertinent to exam  Discussed increased risk of UTI with vaginal dryness and therapeutic effect better with 2 x per week  Has Rx with refills will call when needed  Lab Urine micro/culture  Screening labs TSH, HgbA1-c  Pap smear not taken today   counseled on breast self exam, mammography screening, adequate intake of calcium and vitamin D, diet and exercise, Kegel's exercises  return annually or prn  An After Visit Summary was printed and given to the patient.

## 2013-11-25 NOTE — Patient Instructions (Signed)

## 2013-11-25 NOTE — Telephone Encounter (Signed)
Patient request a refill of vitamin d, pharmacy on file.

## 2013-11-26 LAB — URINALYSIS, MICROSCOPIC ONLY
Bacteria, UA: NONE SEEN
CRYSTALS: NONE SEEN
Casts: NONE SEEN

## 2013-11-26 LAB — URINE CULTURE
Colony Count: NO GROWTH
Organism ID, Bacteria: NO GROWTH

## 2013-11-26 NOTE — Telephone Encounter (Signed)
Vitamin D added. Cynthia Snyder in the Lab notified. LM for pt with plan  @ [phone] Per DPR

## 2013-11-27 ENCOUNTER — Telehealth: Payer: Self-pay | Admitting: *Deleted

## 2013-11-27 LAB — VITAMIN D 25 HYDROXY (VIT D DEFICIENCY, FRACTURES): Vit D, 25-Hydroxy: 47 ng/mL (ref 30–89)

## 2013-11-27 NOTE — Telephone Encounter (Signed)
Message copied by Alfonzo Feller on Thu Nov 27, 2013 11:34 AM ------      Message from: Regina Eck      Created: Thu Nov 27, 2013  8:19 AM       Notify patient that Vitamin D is normal range, can maintain on OTC Vit. D 800 IU daily ------

## 2013-11-27 NOTE — Telephone Encounter (Signed)
Message copied by Alfonzo Feller on Thu Nov 27, 2013 11:34 AM ------      Message from: Regina Eck      Created: Thu Nov 27, 2013  8:30 AM       Notify patient that urine culture and micro are negative for infection, no treatment needed      TSH and Hgb A1-c are normal ------

## 2013-11-27 NOTE — Telephone Encounter (Signed)
Left Message To Call Back and ask for me today or Joy/Triage nurses tomorrow.

## 2013-11-28 NOTE — Progress Notes (Signed)
Reviewed personally.  M. Suzanne Lenor Provencher, MD.  

## 2013-11-28 NOTE — Telephone Encounter (Signed)
Left message for call back.

## 2013-12-01 NOTE — Telephone Encounter (Signed)
Patient notified see result note 

## 2013-12-01 NOTE — Telephone Encounter (Signed)
Pt is returning a call to Jasmine. °

## 2013-12-01 NOTE — Telephone Encounter (Signed)
Left Message To Call Back  

## 2013-12-08 ENCOUNTER — Encounter: Payer: Self-pay | Admitting: Certified Nurse Midwife

## 2014-05-20 ENCOUNTER — Telehealth: Payer: Self-pay | Admitting: Certified Nurse Midwife

## 2014-05-20 NOTE — Telephone Encounter (Signed)
Provider cx. Left message to cb and rs. Recall entered.

## 2014-08-25 ENCOUNTER — Other Ambulatory Visit: Payer: Self-pay | Admitting: Obstetrics and Gynecology

## 2014-08-25 DIAGNOSIS — Z1231 Encounter for screening mammogram for malignant neoplasm of breast: Secondary | ICD-10-CM

## 2014-09-22 ENCOUNTER — Ambulatory Visit (HOSPITAL_COMMUNITY)
Admission: RE | Admit: 2014-09-22 | Discharge: 2014-09-22 | Disposition: A | Discharge status: Home / Self Care | Payer: BLUE CROSS/BLUE SHIELD | Source: Ambulatory Visit | Attending: Obstetrics and Gynecology | Admitting: Obstetrics and Gynecology

## 2014-09-22 DIAGNOSIS — Z1231 Encounter for screening mammogram for malignant neoplasm of breast: Secondary | ICD-10-CM

## 2014-11-30 ENCOUNTER — Ambulatory Visit: Payer: BC Managed Care – PPO | Admitting: Certified Nurse Midwife

## 2014-12-04 ENCOUNTER — Ambulatory Visit: Payer: Self-pay | Admitting: Certified Nurse Midwife

## 2015-01-22 ENCOUNTER — Ambulatory Visit (INDEPENDENT_AMBULATORY_CARE_PROVIDER_SITE_OTHER): Payer: BLUE CROSS/BLUE SHIELD | Admitting: Certified Nurse Midwife

## 2015-01-22 ENCOUNTER — Encounter: Payer: Self-pay | Admitting: Certified Nurse Midwife

## 2015-01-22 VITALS — BP 120/76 | HR 72 | Resp 16 | Ht 61.75 in | Wt 185.0 lb

## 2015-01-22 DIAGNOSIS — Z8542 Personal history of malignant neoplasm of other parts of uterus: Secondary | ICD-10-CM | POA: Diagnosis not present

## 2015-01-22 DIAGNOSIS — Z124 Encounter for screening for malignant neoplasm of cervix: Secondary | ICD-10-CM

## 2015-01-22 DIAGNOSIS — N952 Postmenopausal atrophic vaginitis: Secondary | ICD-10-CM

## 2015-01-22 DIAGNOSIS — Z01419 Encounter for gynecological examination (general) (routine) without abnormal findings: Secondary | ICD-10-CM

## 2015-01-22 DIAGNOSIS — Z0000 Encounter for general adult medical examination without abnormal findings: Secondary | ICD-10-CM

## 2015-01-22 LAB — POCT URINALYSIS DIPSTICK
Bilirubin, UA: NEGATIVE
Blood, UA: NEGATIVE
GLUCOSE UA: NEGATIVE
Ketones, UA: NEGATIVE
LEUKOCYTES UA: NEGATIVE
NITRITE UA: NEGATIVE
PROTEIN UA: NEGATIVE
UROBILINOGEN UA: NEGATIVE
pH, UA: 5

## 2015-01-22 NOTE — Progress Notes (Signed)
59 y.o. G50P2002 Married  Caucasian Fe here for annual exam. Menopausal no HRT. Denies vaginal bleeding . Using Vagifem sporadic for dryness with good response. Sees Dr. Melford Aase for aex/labs, prn. Sees Rheumatologist for RA medication management/labs also. Declines any labs today. Patient had left side of head shingles one month ago, no residual problems. Occasional stress incontinence with holding bladder to long, no problems with . No other health concerns today. Will have another new grandchild in 2017!   Patient's last menstrual period was 02/06/1982.          Sexually active: Yes.    The current method of family planning is status post hysterectomy.    Exercising: No.  exercise Smoker:  no  Health Maintenance: Pap:  08/1999 neg. MMG: 09-22-14 category b density,birads 1:neg Colonoscopy:  2013 f/u 2018 polyp history BMD:   2015 no report (requested) TDaP:  2014 Shingles: n/a had outbreak11/16 on left side head Pneumonia: n/a Flu vaccine yes Hep C and HIV: not done declines Labs: poct urine-neg Self breast exam: check weekly   reports that she has never smoked. She has never used smokeless tobacco. She reports that she drinks alcohol. She reports that she does not use illicit drugs.  Past Medical History  Diagnosis Date  . Endometrial cancer ([place])   . Esophagus disorder   . Abnormal Pap smear     many yrs ago  . Endometriosis     Past Surgical History  Procedure Laterality Date  . Appendectomy    . Knee surgery    . Exploratory laparotomy    . Bilateral salpingoophorectomy Right 11/24/08    lysis of adhesion  . Abdominal hysterectomy      age 57  . Laparoscopy      age 21  . Jaw bone graft  2004  . Liver biopsy      mild steatosis    Current Outpatient Prescriptions  Medication Sig Dispense Refill  . Calcium 1500 MG tablet Take by mouth.    . Cholecalciferol (VITAMIN D PO) Take by mouth daily.    Marland Kitchen etanercept (ENBREL) 50 MG/ML injection Inject 50 mg into the skin  once a week. On wednesday     . folic acid (FOLVITE) Q000111Q MCG tablet Take 800 mcg by mouth.    Marland Kitchen ibuprofen (ADVIL,MOTRIN) 200 MG tablet Take 400 mg by mouth every 6 (six) hours as needed. For pain     . methotrexate (RHEUMATREX) 2.5 MG tablet 2.5 mg 2 (two) times a week. Takes 4 tablets on Saturday & 3 on sunday    . Multiple Vitamin (MULITIVITAMIN WITH MINERALS) TABS Take 1 tablet by mouth daily.      . Multiple Vitamins-Calcium (VIACTIV MULTI-VITAMIN) CHEW Chew by mouth daily.    . valACYclovir (VALTREX) 1000 MG tablet as needed.  0  . omeprazole (PRILOSEC) 40 MG capsule Take 40 mg by mouth daily as needed. Reported on 01/22/2015     No current facility-administered medications for this visit.    Family History  Problem Relation Age of Onset  . Heart disease Mother   . Hypertension Mother   . Stroke Mother   . Diabetes Mother   . Heart disease Father   . Heart disease Brother   . Cancer Paternal Aunt     ovarian    ROS:  Pertinent items are noted in HPI.  Otherwise, a comprehensive ROS was negative.  Exam:   BP 120/76 mmHg  Pulse 72  Resp 16  Ht 5'  1.75" (1.568 m)  Wt 185 lb (83.915 kg)  BMI 34.13 kg/m2  LMP 02/06/1982 Height: 5' 1.75" (156.8 cm) Ht Readings from Last 3 Encounters:  01/22/15 5' 1.75" (1.568 m)  11/25/13 5\' 2"  (1.575 m)  02/10/13 5' 1.75" (1.568 m)    General appearance: alert, cooperative and appears stated age Head: Normocephalic, without obvious abnormality, atraumatic Neck: no adenopathy, supple, symmetrical, trachea midline and thyroid normal to inspection and palpation Lungs: clear to auscultation bilaterally Breasts: normal appearance, no masses or tenderness, No nipple retraction or dimpling, No nipple discharge or bleeding, No axillary or supraclavicular adenopathy Heart: regular rate and rhythm Abdomen: soft, non-tender; no masses,  no organomegaly Extremities: extremities normal, atraumatic, no cyanosis or edema Skin: Skin color, texture,  turgor normal. No rashes or lesions Lymph nodes: Cervical, supraclavicular, and axillary nodes normal. No abnormal inguinal nodes palpated Neurologic: Grossly normal   Pelvic: External genitalia:  no lesions              Urethra:  normal appearing urethra with no masses, tenderness or lesions              Bartholin's and Skene's: normal                 Vagina:atrophic appearing vagina with pale color and scant moisture, no lesions, good support              Cervix: absent              Pap taken: Yes.   Bimanual Exam:  Uterus:  uterus absent              Adnexa: no mass, fullness, tenderness and adnexal absent               Rectovaginal: Confirms               Anus:  normal sphincter tone, no lesions  Chaperone present: yes  A:  Well Woman with normal exam  Menopausal no HRT S/P TAH for endometrial cancer, BSO later  Atrophic vaginitis with no Vagifem use( patient admitted in conversation cost prohibitive and had not used)  Psoriatic arthritis with Rheumatology management  Osteopenia history need last BMD report  Recent Shingles occurrence with residual problems     P:   Reviewed health and wellness pertinent to exam  Aware of need to evaluate if vaginal bleeding  Discussed atrophic vaginitis finding and need for moisture in vagina. Discussed association with urinary incontinence. Discussed Coconut oil use with applicator and in vulva area daily. Patient uses on face and feels she can do this. Will advise if no change.  Continue follow with MD as indicated  Pap smear as above with HPV reflex   counseled on breast self exam, mammography screening, adequate intake of calcium and vitamin D, diet and exercise, Kegel's exercises  return annually or prn  An After Visit Summary was printed and given to the patient.

## 2015-01-22 NOTE — Patient Instructions (Signed)

## 2015-01-23 NOTE — Progress Notes (Signed)
Reviewed personally.  M. Suzanne Aella Ronda, MD.  

## 2015-01-25 LAB — IPS PAP TEST WITH REFLEX TO HPV

## 2015-01-26 ENCOUNTER — Telehealth: Payer: Self-pay | Admitting: Certified Nurse Midwife

## 2015-01-26 NOTE — Telephone Encounter (Signed)
Left msg for pt to call regarding records release.

## 2015-12-03 ENCOUNTER — Other Ambulatory Visit (HOSPITAL_COMMUNITY): Payer: Self-pay | Admitting: Gastroenterology

## 2015-12-03 DIAGNOSIS — R1314 Dysphagia, pharyngoesophageal phase: Secondary | ICD-10-CM

## 2015-12-03 DIAGNOSIS — R1310 Dysphagia, unspecified: Secondary | ICD-10-CM

## 2015-12-08 ENCOUNTER — Ambulatory Visit (HOSPITAL_COMMUNITY)
Admission: RE | Admit: 2015-12-08 | Discharge: 2015-12-08 | Disposition: A | Discharge status: Home / Self Care | Payer: BLUE CROSS/BLUE SHIELD | Source: Ambulatory Visit | Attending: Gastroenterology | Admitting: Gastroenterology

## 2015-12-08 DIAGNOSIS — K219 Gastro-esophageal reflux disease without esophagitis: Secondary | ICD-10-CM | POA: Insufficient documentation

## 2015-12-08 DIAGNOSIS — R1314 Dysphagia, pharyngoesophageal phase: Secondary | ICD-10-CM | POA: Diagnosis present

## 2015-12-08 DIAGNOSIS — K222 Esophageal obstruction: Secondary | ICD-10-CM | POA: Insufficient documentation

## 2015-12-08 DIAGNOSIS — K449 Diaphragmatic hernia without obstruction or gangrene: Secondary | ICD-10-CM | POA: Insufficient documentation

## 2016-02-10 ENCOUNTER — Other Ambulatory Visit: Payer: Self-pay | Admitting: Certified Nurse Midwife

## 2016-02-10 DIAGNOSIS — Z1231 Encounter for screening mammogram for malignant neoplasm of breast: Secondary | ICD-10-CM

## 2016-02-11 ENCOUNTER — Ambulatory Visit: Payer: BLUE CROSS/BLUE SHIELD | Admitting: Certified Nurse Midwife

## 2016-02-14 ENCOUNTER — Ambulatory Visit
Admission: RE | Admit: 2016-02-14 | Discharge: 2016-02-14 | Disposition: A | Discharge status: Home / Self Care | Payer: BLUE CROSS/BLUE SHIELD | Source: Ambulatory Visit | Attending: Certified Nurse Midwife | Admitting: Certified Nurse Midwife

## 2016-02-14 DIAGNOSIS — Z1231 Encounter for screening mammogram for malignant neoplasm of breast: Secondary | ICD-10-CM

## 2016-03-01 ENCOUNTER — Encounter: Payer: Self-pay | Admitting: Certified Nurse Midwife

## 2016-03-01 ENCOUNTER — Ambulatory Visit (INDEPENDENT_AMBULATORY_CARE_PROVIDER_SITE_OTHER): Payer: BLUE CROSS/BLUE SHIELD | Admitting: Certified Nurse Midwife

## 2016-03-01 ENCOUNTER — Telehealth: Payer: Self-pay | Admitting: Certified Nurse Midwife

## 2016-03-01 VITALS — BP 108/64 | HR 68 | Resp 16 | Ht 61.5 in | Wt 175.0 lb

## 2016-03-01 DIAGNOSIS — Z124 Encounter for screening for malignant neoplasm of cervix: Secondary | ICD-10-CM

## 2016-03-01 DIAGNOSIS — Z01419 Encounter for gynecological examination (general) (routine) without abnormal findings: Secondary | ICD-10-CM | POA: Diagnosis not present

## 2016-03-01 NOTE — Telephone Encounter (Signed)
Patient was seen today and updated her medication list incorrectly with Pantoprazole. Patient is not taking this medication. She is taking Omeprazole. No need to call patient unless you have questions.

## 2016-03-01 NOTE — Progress Notes (Signed)
61 y.o. G45P2002 Married  Caucasian Fe here for annual exam. Menopausal no HRT. Denies vaginal bleeding. Some vaginal dryness uses OTC moisturizer with good results. Has been working on weight control, with 10 pounds lost. Still working part time and caring for invalid mother and grandchildren, but has support system if needed. Sees PCP for aex exam, labs and GI for Esophageal issues. No other health issues today.  Patient's last menstrual period was 02/06/1982.          Sexually active: Yes.    The current method of family planning is status post hysterectomy.    Exercising: Yes.    taking care of mother & helping with grandchildren Smoker:  no  Health Maintenance: Pap:  01-22-15 neg MMG:  02-14-16 category b density birads 1:neg Colonoscopy:  2013 f/u 54yrs due to polyp hx BMD:   2015  TDaP:  2014 Shingles: had done, had shingles on her head Pneumonia: had done Hep C and HIV: not done Labs: pcp Self breast exam: done weekly   reports that she has never smoked. She has never used smokeless tobacco. She reports that she does not drink alcohol or use drugs.  Past Medical History:  Diagnosis Date  . Abnormal Pap smear    many yrs ago  . Endometrial cancer ([place])   . Endometriosis   . Esophagus disorder   . Psoriatic arthritis ([place])   . Shingles     Past Surgical History:  Procedure Laterality Date  . ABDOMINAL HYSTERECTOMY     age 50  . APPENDECTOMY    . BILATERAL SALPINGOOPHORECTOMY Right 11/24/08   lysis of adhesion  . esophageal stretched    . EXPLORATORY LAPAROTOMY    . jaw bone graft  2004  . KNEE SURGERY    . LAPAROSCOPY     age 23  . LIVER BIOPSY     mild steatosis    Current Outpatient Prescriptions  Medication Sig Dispense Refill  . Calcium 1500 MG tablet Take by mouth.    . Cholecalciferol (VITAMIN D PO) Take by mouth daily.    Scarlette Shorts SURECLICK 50 MG/ML injection   2  . folic acid (FOLVITE) Q000111Q MCG tablet Take 800 mcg by mouth.    Marland Kitchen ibuprofen  (ADVIL,MOTRIN) 200 MG tablet Take 400 mg by mouth every 6 (six) hours as needed. For pain     . methotrexate (RHEUMATREX) 2.5 MG tablet 2.5 mg 2 (two) times a week. Takes 4 tablets on Saturday & 3 on sunday    . Multiple Vitamin (MULITIVITAMIN WITH MINERALS) TABS Take 1 tablet by mouth daily.      . Multiple Vitamins-Calcium (VIACTIV MULTI-VITAMIN) CHEW Chew by mouth daily.    . pantoprazole (PROTONIX) 40 MG tablet Take 40 mg by mouth as needed.    . valACYclovir (VALTREX) 1000 MG tablet as needed.  0   No current facility-administered medications for this visit.     Family History  Problem Relation Age of Onset  . Heart disease Mother   . Hypertension Mother   . Stroke Mother   . Diabetes Mother   . Osteoporosis Mother   . Heart disease Father   . Heart disease Brother   . Cancer Paternal Aunt     ovarian  . Colon cancer Maternal Grandmother     ROS:  Pertinent items are noted in HPI.  Otherwise, a comprehensive ROS was negative.  Exam:   BP 108/64   Pulse 68   Resp 16  Ht 5' 1.5" (1.562 m)   Wt 175 lb (79.4 kg)   LMP 02/06/1982   BMI 32.53 kg/m  Height: 5' 1.5" (156.2 cm) Ht Readings from Last 3 Encounters:  03/01/16 5' 1.5" (1.562 m)  01/22/15 5' 1.75" (1.568 m)  11/25/13 5\' 2"  (1.575 m)    General appearance: alert, cooperative and appears stated age Head: Normocephalic, without obvious abnormality, atraumatic Neck: no adenopathy, supple, symmetrical, trachea midline and thyroid normal to inspection and palpation Lungs: clear to auscultation bilaterally Breasts: normal appearance, no masses or tenderness, No nipple retraction or dimpling, No nipple discharge or bleeding, No axillary or supraclavicular adenopathy Heart: regular rate and rhythm Abdomen: soft, non-tender; no masses,  no organomegaly Extremities: extremities normal, atraumatic, no cyanosis or edema Skin: Skin color, texture, turgor normal. No rashes or lesions Lymph nodes: Cervical, supraclavicular,  and axillary nodes normal. No abnormal inguinal nodes palpated Neurologic: Grossly normal   Pelvic: External genitalia:  no lesions              Urethra:  normal appearing urethra with no masses, tenderness or lesions              Bartholin's and Skene's: normal                 Vagina: normal appearing vagina with normal color and discharge, no lesions              Cervix: absent              Pap taken: Yes.   Bimanual Exam:  Uterus:  uterus absent              Adnexa: no mass, fullness, tenderness and adnexal absent bilateral               Rectovaginal: Confirms               Anus:  normal sphincter tone, no lesions  Chaperone present: yes  A:  Well Woman with normal exam  Menopausal no HRT s/p TAH with BSO, history of endometrial cancer  Atrophic vaginitis stopped Vagifem use, uses OTC as needed  RA with Rheumatology management  Colonoscopy due this year  P:   Reviewed health and wellness pertinent to exam  Aware if vaginal bleeding need to advise  Patient will call if she desires to restart  Continue follow up with MD as indicated  Pap smear as above with HPV reflex  Patient will schedule colonoscopy   counseled on breast self exam, mammography screening, adequate intake of calcium and vitamin D, diet and exercise  return annually or prn  An After Visit Summary was printed and given to the patient.

## 2016-03-01 NOTE — Patient Instructions (Signed)

## 2016-03-01 NOTE — Telephone Encounter (Signed)
Patient's chart has been updated. Pantoprazole has been discontinued. Omeprazole 40 mg daily as needed has been pulled from her pharmacy medication list and added to current medication list.  Routing to provider for final review. Patient agreeable to disposition. Will close encounter.

## 2016-03-01 NOTE — Progress Notes (Signed)
Encounter reviewed Jill Jertson, MD   

## 2016-03-03 LAB — IPS PAP TEST WITH REFLEX TO HPV

## 2016-04-06 HISTORY — PX: LIPOSUCTION TRUNK: SUR833

## 2016-08-04 DIAGNOSIS — G2581 Restless legs syndrome: Secondary | ICD-10-CM | POA: Insufficient documentation

## 2016-08-04 DIAGNOSIS — K209 Esophagitis, unspecified without bleeding: Secondary | ICD-10-CM | POA: Insufficient documentation

## 2016-08-04 DIAGNOSIS — M47812 Spondylosis without myelopathy or radiculopathy, cervical region: Secondary | ICD-10-CM | POA: Insufficient documentation

## 2016-08-04 DIAGNOSIS — L409 Psoriasis, unspecified: Secondary | ICD-10-CM | POA: Insufficient documentation

## 2016-08-04 DIAGNOSIS — Z79899 Other long term (current) drug therapy: Secondary | ICD-10-CM | POA: Insufficient documentation

## 2016-08-04 NOTE — Progress Notes (Signed)
Office Visit Note  Patient: Cynthia Snyder             Date of Birth: 22-Mar-1955           MRN: [id number]             PCP: Chesley Noon, MD Referring: Chesley Noon, MD Visit Date: 08/07/2016 Occupation: @GUAROCC @    Subjective:  Medication management.   History of Present Illness: Cynthia Snyder is a 61 y.o. female with history of psoriatic arthritis and psoriasis seen in consultation per request of Dr. Melford Aase.   patient was diagnsed with psorisis t ae 24. She states in 1994 she started seeing Dr. Charlestine Night for increased joint pain and swelling. At the time she was diagnosed with psoriatic arthritis. She recalls that she was started on low-dose methotrexate. Which was gradually increased. Later Enbrel was added for psoriasis on her scalp. She states over time she did really well without much discomfort in her joints and the rash cleared up. She has decreased her Enbrel to every other week which she is tolerating well without any flares. She states she has some puffiness in her hands but they do not swell. She does not have any joint pain or rash at this point. She also gives history of leg length discrepancy which causes discomfort in her right hip. She states she sees chiropractor for that. Activities of Daily Living:  Patient reports morning stiffness for 30 minutes.   Patient Reports nocturnal pain.  Difficulty dressing/grooming: Denies Difficulty climbing stairs: Denies Difficulty getting out of chair: Denies Difficulty using hands for taps, buttons, cutlery, and/or writing: Denies   Review of Systems  Constitutional: Negative for fatigue, night sweats, weight gain, weight loss and weakness.  HENT: Positive for mouth dryness. Negative for mouth sores, trouble swallowing, trouble swallowing and nose dryness.        Related to meds  Eyes: Negative for pain, redness, visual disturbance and dryness.  Respiratory: Negative for cough, shortness of breath and difficulty  breathing.   Cardiovascular: Negative for chest pain, palpitations, hypertension, irregular heartbeat and swelling in legs/feet.  Gastrointestinal: Negative for blood in stool, constipation and diarrhea.  Endocrine: Negative for increased urination.  Genitourinary: Negative for vaginal dryness.  Musculoskeletal: Positive for morning stiffness. Negative for arthralgias, joint pain, joint swelling, myalgias, muscle weakness, muscle tenderness and myalgias.  Skin: Positive for sensitivity to sunlight. Negative for color change, rash, hair loss, skin tightness and ulcers.  Allergic/Immunologic: Negative for susceptible to infections.  Neurological: Negative for dizziness, memory loss and night sweats.  Hematological: Negative for swollen glands.  Psychiatric/Behavioral: Negative for depressed mood and sleep disturbance. The patient is not nervous/anxious.     PMFS History:  Patient Active Problem List   Diagnosis Date Noted  . Psoriasis 08/04/2016  . DJD (degenerative joint disease), cervical 08/04/2016  . Esophagitis 08/04/2016  . Restless leg syndrome 08/04/2016  . High risk medication use 08/04/2016  . Postmenopausal atrophic vaginitis 01/22/2015    Class: Chronic    Past Medical History:  Diagnosis Date  . Abnormal Pap smear    many yrs ago  . Endometrial cancer ([place])   . Endometriosis   . Esophagus disorder   . Psoriatic arthritis ([place])   . Shingles   . Shingles     Family History  Problem Relation Age of Onset  . Heart disease Mother   . Hypertension Mother   . Stroke Mother   . Diabetes Mother   .  Osteoporosis Mother   . Heart disease Father   . Heart disease Brother   . Cancer Paternal Aunt        ovarian  . Colon cancer Maternal Grandmother    Past Surgical History:  Procedure Laterality Date  . ABDOMINAL HYSTERECTOMY     age 12  . APPENDECTOMY    . BILATERAL SALPINGOOPHORECTOMY Right 11/24/08   lysis of adhesion  . esophageal stretched    . EXPLORATORY  LAPAROTOMY    . jaw bone graft  2004  . KNEE SURGERY    . LAPAROSCOPY     age 46  . LIVER BIOPSY     mild steatosis   Social History   Social History Narrative  . No narrative on file     Objective: Vital Signs: BP 125/85   Pulse 69   Resp 12   Ht 5\' 2"  (1.575 m)   Wt 180 lb (81.6 kg)   LMP 02/06/1982   BMI 32.92 kg/m    Physical Exam  Constitutional: She is oriented to person, place, and time. She appears well-developed and well-nourished.  HENT:  Head: Normocephalic and atraumatic.  Eyes: Conjunctivae and EOM are normal.  Neck: Normal range of motion.  Cardiovascular: Normal rate, regular rhythm, normal heart sounds and intact distal pulses.   Pulmonary/Chest: Effort normal and breath sounds normal.  Abdominal: Soft. Bowel sounds are normal.  Lymphadenopathy:    She has no cervical adenopathy.  Neurological: She is alert and oriented to person, place, and time.  Skin: Skin is warm and dry. Capillary refill takes less than 2 seconds.  Psychiatric: She has a normal mood and affect. Her behavior is normal.  Nursing note and vitals reviewed.    Musculoskeletal Exam: C-spine and thoracic lumbar spine good range of motion. She is some thoracic kyphosis. No SI joint tenderness was noted. Shoulder joints elbow joints wrist joints are good range of motion. She is incomplete fist formation due to DIP PIP decreased range of motion but no synovitis was noted. She did have thickening of PIP/DIP joints bilaterally. Hip joints knee joints are good range of motion. She is some postsurgical changes in her left ankle. MTB PIP thickening was noted but no synovitis on her MTPs PIPs DIPs were noted.  CDAI Exam: CDAI Homunculus Exam:   Joint Counts:  CDAI Tender Joint count: 0 CDAI Swollen Joint count: 0  Global Assessments:  Patient Global Assessment: 3 Provider Global Assessment: 3  CDAI Calculated Score: 6    Investigation: Findings:  Labs from Dr. Elmon Else  records 04/06/2016 CBC normal hemoglobin 14.4 redness to 11 creatinine 0.7 AST 19 ALT13 X-ray bilateral hands 01/18/2016 no MCP joint narrowing or erosive changes were noted except right first MCP downgoing. All PIP and DIP joint narrowing was noted. Mild intercarpal joint and radiocarpal joint space narrowing was noted. No erosive changes were noted. These signs were consistent with osteoarthritis and psoriatic arthritis overlap    Imaging: Xr Foot 2 Views Left  Result Date: 08/07/2016 All PIP/DIP narrowing was noted. No MTP joint space narrowing or e erosive changes noted. No intercarpal joint space narrowing was noted. Calcification in plantar fascia was noted. Posterior calcaneal spur was noted. Impression: These studies are consistent with osteoarthritis of the foot  Xr Foot 2 Views Right  Result Date: 08/07/2016 Right first MTP all PIP/DIP narrowing was noted. No erosive changes were noted. No intercarpal joint space narrowing was noted. Calcaneal spur was noted. Impression these findings are consistent with osteoarthritis.  Xr Hand 2 View Left  Result Date: 08/07/2016 Left first and second MCP PIP/DIP narrowing was noted. No erosive changes were noted. No intercarpal or radiocarpal joint space narrowing was noted. 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 Right first and second MCP joint space narrowing was noted. No erosive changes were noted. No metacarpocarpal or intercarpal joint or radiocarpal joint space narrowing was noted. 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   Speciality Comments: No specialty comments available.    Procedures:  No procedures performed Allergies: Patient has no known allergies.   Assessment / Plan:     Visit Diagnoses: Psoriatic arthropathy ([place]) - Diagnosis 1994. Her psoriatic  arthritis seems to be very well controlled with no synovitis today on examination. She is incomplete fist formation due to underlying psoriatic arthritis and osteoarthritis. At this point she will continue current treatment.   Psoriasis - Diagnosed at age 38. She has no active lesions of psoriasis on examination today. Her symptoms have been very well controlled with methotrexate and Enbrel combination.  High risk medication use - Enbrel 50 mg subcutaneous every other week, methotrexate 7 tablets every every week, folic acid 811 g by mouth daily. Detailed counseling regarding Enbrel and methotrexate was provided. Indications side effects contraindications were discussed. A handout was given consent was taken. I will obtain following labs today. - Plan: CBC with Differential/Platelet, COMPLETE METABOLIC PANEL WITH GFR, Urinalysis, Routine w reflex microscopic, IgG, IgA, IgM, Quantiferon tb gold assay (blood), Serum protein electrophoresis with reflex, Hepatitis B core antibody, IgM, Hepatitis B surface antigen, Hepatitis C antibody, HIV antibody. She's been advised to get his skin examination or yearly basis. She will need TB gold on a yearly basis. Her CBC and CMP will be monitored every 3 months.  Pain in both hands - Plan: XR Hand 2 View Right, XR Hand 2 View Left. X-rays were consistent with psoriatic arthritis and osteoarthritis overlap. No erosive changes were noted.  Pain in both feet - Plan: XR Foot 2 Views Right, XR Foot 2 Views Left. X-rays were consistent with osteoarthritis of bilateral feet.  DJD (degenerative joint disease), cervical - Status post fusion: She is fairly good range of motion.  Status post left ankle surgery for ruptured Achilles tendon in the past.  Esophagitis - Status post esophageal dilation, on omeprazole  Restless leg syndrome    Orders: Orders Placed This Encounter  Procedures  . XR Hand 2 View Right  . XR Hand 2 View Left  . XR Foot 2 Views Right  . XR  Foot 2 Views Left  . CBC with Differential/Platelet  . COMPLETE METABOLIC PANEL WITH GFR  . Urinalysis, Routine w reflex microscopic  . IgG, IgA, IgM  . Quantiferon tb gold assay (blood)  . Serum protein electrophoresis with reflex  . Hepatitis B core antibody, IgM  . Hepatitis B surface antigen  . Hepatitis C antibody  . HIV antibody  . CBC with Differential/Platelet  . COMPLETE METABOLIC PANEL WITH GFR   No orders of the defined types were placed in this encounter.   Face-to-face time spent with patient was 50 minutes. 50% of time was spent in counseling and coordination of care.  Follow-Up Instructions: Return in about 3 months (around 11/07/2016) for Psoriatic arthritis.   Bo Merino, MD  Note - This record has been created using Editor, commissioning.  Chart  creation errors have been sought, but may not always  have been located. Such creation errors do not reflect on  the standard of medical care.

## 2016-08-07 ENCOUNTER — Ambulatory Visit (INDEPENDENT_AMBULATORY_CARE_PROVIDER_SITE_OTHER): Payer: BLUE CROSS/BLUE SHIELD | Admitting: Rheumatology

## 2016-08-07 ENCOUNTER — Ambulatory Visit (INDEPENDENT_AMBULATORY_CARE_PROVIDER_SITE_OTHER): Payer: BLUE CROSS/BLUE SHIELD

## 2016-08-07 ENCOUNTER — Other Ambulatory Visit: Payer: Self-pay | Admitting: Rheumatology

## 2016-08-07 ENCOUNTER — Encounter: Payer: Self-pay | Admitting: Rheumatology

## 2016-08-07 VITALS — BP 125/85 | HR 69 | Resp 12 | Ht 62.0 in | Wt 180.0 lb

## 2016-08-07 DIAGNOSIS — M79671 Pain in right foot: Secondary | ICD-10-CM | POA: Diagnosis not present

## 2016-08-07 DIAGNOSIS — L409 Psoriasis, unspecified: Secondary | ICD-10-CM

## 2016-08-07 DIAGNOSIS — L4050 Arthropathic psoriasis, unspecified: Secondary | ICD-10-CM

## 2016-08-07 DIAGNOSIS — M5030 Other cervical disc degeneration, unspecified cervical region: Secondary | ICD-10-CM | POA: Diagnosis not present

## 2016-08-07 DIAGNOSIS — K209 Esophagitis, unspecified without bleeding: Secondary | ICD-10-CM

## 2016-08-07 DIAGNOSIS — M79642 Pain in left hand: Secondary | ICD-10-CM | POA: Diagnosis not present

## 2016-08-07 DIAGNOSIS — M79641 Pain in right hand: Secondary | ICD-10-CM

## 2016-08-07 DIAGNOSIS — G2581 Restless legs syndrome: Secondary | ICD-10-CM

## 2016-08-07 DIAGNOSIS — Z79899 Other long term (current) drug therapy: Secondary | ICD-10-CM

## 2016-08-07 DIAGNOSIS — M79672 Pain in left foot: Secondary | ICD-10-CM

## 2016-08-07 DIAGNOSIS — M47812 Spondylosis without myelopathy or radiculopathy, cervical region: Secondary | ICD-10-CM

## 2016-08-07 LAB — CBC WITH DIFFERENTIAL/PLATELET
BASOS PCT: 0 %
Basophils Absolute: 0 cells/uL (ref 0–200)
EOS ABS: 112 {cells}/uL (ref 15–500)
Eosinophils Relative: 2 %
HCT: 44.6 % (ref 35.0–45.0)
HEMOGLOBIN: 14.7 g/dL (ref 11.7–15.5)
LYMPHS ABS: 2296 {cells}/uL (ref 850–3900)
Lymphocytes Relative: 41 %
MCH: 29.5 pg (ref 27.0–33.0)
MCHC: 33 g/dL (ref 32.0–36.0)
MCV: 89.6 fL (ref 80.0–100.0)
MONO ABS: 560 {cells}/uL (ref 200–950)
MONOS PCT: 10 %
MPV: 10.7 fL (ref 7.5–12.5)
NEUTROS ABS: 2632 {cells}/uL (ref 1500–7800)
Neutrophils Relative %: 47 %
PLATELETS: 177 10*3/uL (ref 140–400)
RBC: 4.98 MIL/uL (ref 3.80–5.10)
RDW: 14.2 % (ref 11.0–15.0)
WBC: 5.6 10*3/uL (ref 3.8–10.8)

## 2016-08-07 LAB — COMPLETE METABOLIC PANEL WITH GFR
ALBUMIN: 4.4 g/dL (ref 3.6–5.1)
ALT: 16 U/L (ref 6–29)
AST: 18 U/L (ref 10–35)
Alkaline Phosphatase: 82 U/L (ref 33–130)
BILIRUBIN TOTAL: 0.6 mg/dL (ref 0.2–1.2)
BUN: 11 mg/dL (ref 7–25)
CO2: 25 mmol/L (ref 20–31)
CREATININE: 0.81 mg/dL (ref 0.50–0.99)
Calcium: 9.6 mg/dL (ref 8.6–10.4)
Chloride: 106 mmol/L (ref 98–110)
GFR, Est African American: >89 mL/min (ref >=60)
GFR, Est Non African American: 79 mL/min (ref >=60)
GLUCOSE: 83 mg/dL (ref 65–99)
Potassium: 4.1 mmol/L (ref 3.5–5.3)
SODIUM: 141 mmol/L (ref 135–146)
TOTAL PROTEIN: 7 g/dL (ref 6.1–8.1)

## 2016-08-07 MED ORDER — METHOTREXATE 2.5 MG PO TABS: 17.5000 mg | ORAL_TABLET | ORAL | 0 refills | Status: DC

## 2016-08-07 NOTE — Telephone Encounter (Signed)
Last Visit: 08/07/16 Next Visit: 11/14/16 Labs: 04/06/2016 CBC normal hemoglobin 14.4 redness to 11 creatinine 0.7 AST 19 ALT13  Okay to refill MTX?

## 2016-08-07 NOTE — Telephone Encounter (Signed)
Patient will need MTX renewed in next two weeks. Patient uses [facility] in [place]. Please advise patient when called in.

## 2016-08-07 NOTE — Patient Instructions (Addendum)
Standing Labs We placed an order today for your standing lab work.    Please come back and get your standing labs in October and every 3 months  We have open lab Monday through Friday from 8:30-11:30 AM and 1:30-4 PM at the office of Dr. Tresa Moore, PA.   The office is located at [address], [place], [place], [place] [address] No appointment is necessary.   Labs are drawn by [facility].  You may receive a bill from [facility] for your lab work. If you have any questions regarding directions or hours of operation,  please call [phone].   Methotrexate tablets What is this medicine? METHOTREXATE (METH oh TREX ate) is a chemotherapy drug used to treat cancer including breast cancer, leukemia, and lymphoma. This medicine can also be used to treat psoriasis and certain kinds of arthritis. This medicine may be used for other purposes; ask your health care provider or pharmacist if you have questions. COMMON BRAND NAME(S): Rheumatrex, Trexall What should I tell my health care provider before I take this medicine? They need to know if you have any of these conditions: -fluid in the stomach area or lungs -if you often drink alcohol -infection or immune system problems -kidney disease or on hemodialysis -liver disease -low blood counts, like low white cell, platelet, or red cell counts -lung disease -radiation therapy -stomach ulcers -ulcerative colitis -an unusual or allergic reaction to methotrexate, other medicines, foods, dyes, or preservatives -pregnant or trying to get pregnant -breast-feeding How should I use this medicine? Take this medicine by mouth with a glass of water. Follow the directions on the prescription label. Take your medicine at regular intervals. Do not take it more often than directed. Do not stop taking except on your doctor's advice. Make sure you know why you are taking this medicine and how often you should take it. If this medicine is used  for a condition that is not cancer, like arthritis or psoriasis, it should be taken weekly, NOT daily. Taking this medicine more often than directed can cause serious side effects, even death. Talk to your healthcare provider about safe handling and disposal of this medicine. You may need to take special precautions. Talk to your pediatrician regarding the use of this medicine in children. While this drug may be prescribed for selected conditions, precautions do apply. Overdosage: If you think you have taken too much of this medicine contact a poison control center or emergency room at once. NOTE: This medicine is only for you. Do not share this medicine with others. What if I miss a dose? If you miss a dose, talk with your doctor or health care professional. Do not take double or extra doses. What may interact with this medicine? This medicine may interact with the following medication: -acitretin -aspirin and aspirin-like medicines including salicylates -azathioprine -certain antibiotics like penicillins, tetracycline, and chloramphenicol -cyclosporine -gold -hydroxychloroquine -live virus vaccines -NSAIDs, medicines for pain and inflammation, like ibuprofen or naproxen -other cytotoxic agents -penicillamine -phenylbutazone -phenytoin -probenecid -retinoids such as isotretinoin and tretinoin -steroid medicines like prednisone or cortisone -sulfonamides like sulfasalazine and trimethoprim/sulfamethoxazole -theophylline This list may not describe all possible interactions. Give your health care provider a list of all the medicines, herbs, non-prescription drugs, or dietary supplements you use. Also tell them if you smoke, drink alcohol, or use illegal drugs. Some items may interact with your medicine. What should I watch for while using this medicine? Avoid alcoholic drinks. This medicine can make you more sensitive  to the sun. Keep out of the sun. If you cannot avoid being in the sun,  wear protective clothing and use sunscreen. Do not use sun lamps or tanning beds/booths. You may need blood work done while you are taking this medicine. Call your doctor or health care professional for advice if you get a fever, chills or sore throat, or other symptoms of a cold or flu. Do not treat yourself. This drug decreases your body's ability to fight infections. Try to avoid being around people who are sick. This medicine may increase your risk to bruise or bleed. Call your doctor or health care professional if you notice any unusual bleeding. Check with your doctor or health care professional if you get an attack of severe diarrhea, nausea and vomiting, or if you sweat a lot. The loss of too much body fluid can make it dangerous for you to take this medicine. Talk to your doctor about your risk of cancer. You may be more at risk for certain types of cancers if you take this medicine. Both men and women must use effective birth control with this medicine. Do not become pregnant while taking this medicine or until at least 1 normal menstrual cycle has occurred after stopping it. Women should inform their doctor if they wish to become pregnant or think they might be pregnant. Men should not father a child while taking this medicine and for 3 months after stopping it. There is a potential for serious side effects to an unborn child. Talk to your health care professional or pharmacist for more information. Do not breast-feed an infant while taking this medicine. What side effects may I notice from receiving this medicine? Side effects that you should report to your doctor or health care professional as soon as possible: -allergic reactions like skin rash, itching or hives, swelling of the face, lips, or tongue -breathing problems or shortness of breath -diarrhea -dry, nonproductive cough -low blood counts - this medicine may decrease the number of white blood cells, red blood cells and platelets.  You may be at increased risk for infections and bleeding. -mouth sores -redness, blistering, peeling or loosening of the skin, including inside the mouth -signs of infection - fever or chills, cough, sore throat, pain or trouble passing urine -signs and symptoms of bleeding such as bloody or black, tarry stools; red or dark-brown urine; spitting up blood or brown material that looks like coffee grounds; red spots on the skin; unusual bruising or bleeding from the eye, gums, or nose -signs and symptoms of kidney injury like trouble passing urine or change in the amount of urine -signs and symptoms of liver injury like dark yellow or brown urine; general ill feeling or flu-like symptoms; light-colored stools; loss of appetite; nausea; right upper belly pain; unusually weak or tired; yellowing of the eyes or skin Side effects that usually do not require medical attention (report to your doctor or health care professional if they continue or are bothersome): -dizziness -hair loss -tiredness -upset stomach -vomiting This list may not describe all possible side effects. Call your doctor for medical advice about side effects. You may report side effects to FDA at 1-800-FDA-1088. Where should I keep my medicine? Keep out of the reach of children. Store at room temperature between 20 and 25 degrees C (68 and 77 degrees F). Protect from light. Throw away any unused medicine after the expiration date. NOTE: This sheet is a summary. It may not cover all possible information. If you  have questions about this medicine, talk to your doctor, pharmacist, or health care provider.  2018 Elsevier/Gold Standard (2014-09-28 05:39:22) Etanercept injection What is this medicine? ETANERCEPT (et a [employer]) is used for the treatment of rheumatoid arthritis in adults and children. The medicine is also used to treat psoriatic arthritis, ankylosing spondylitis, and psoriasis. This medicine may be used for other purposes;  ask your health care provider or pharmacist if you have questions. COMMON BRAND NAME(S): Enbrel What should I tell my health care provider before I take this medicine? They need to know if you have any of these conditions: -blood disorders -cancer -congestive heart failure -diabetes -exposure to chickenpox -immune system problems -infection -multiple sclerosis -seizure disorder -tuberculosis, a positive skin test for tuberculosis or have recently been in close contact with someone who has tuberculosis -Wegener's granulomatosis -an unusual or allergic reaction to etanercept, latex, other medicines, foods, dyes, or preservatives -pregnant or trying to get pregnant -breast-feeding How should I use this medicine? The medicine is given by injection under the skin. You will be taught how to prepare and give this medicine. Use exactly as directed. Take your medicine at regular intervals. Do not take your medicine more often than directed. It is important that you put your used needles and syringes in a special sharps container. Do not put them in a trash can. If you do not have a sharps container, call your pharmacist or healthcare provider to get one. A special MedGuide will be given to you by the pharmacist with each prescription and refill. Be sure to read this information carefully each time. Talk to your pediatrician regarding the use of this medicine in children. While this drug may be prescribed for children as young as 50 years of age for selected conditions, precautions do apply. Overdosage: If you think you have taken too much of this medicine contact a poison control center or emergency room at once. NOTE: This medicine is only for you. Do not share this medicine with others. What if I miss a dose? If you miss a dose, contact your health care professional to find out when you should take your next dose. Do not take double or extra doses without advice. What may interact with this  medicine? Do not take this medicine with any of the following medications: -anakinra This medicine may also interact with the following medications: -cyclophosphamide -sulfasalazine -vaccines This list may not describe all possible interactions. Give your health care provider a list of all the medicines, herbs, non-prescription drugs, or dietary supplements you use. Also tell them if you smoke, drink alcohol, or use illegal drugs. Some items may interact with your medicine. What should I watch for while using this medicine? Tell your doctor or healthcare professional if your symptoms do not start to get better or if they get worse. You will be tested for tuberculosis (TB) before you start this medicine. If your doctor prescribes any medicine for TB, you should start taking the TB medicine before starting this medicine. Make sure to finish the full course of TB medicine. Call your doctor or health care professional for advice if you get a fever, chills or sore throat, or other symptoms of a cold or flu. Do not treat yourself. This drug decreases your body's ability to fight infections. Try to avoid being around people who are sick. What side effects may I notice from receiving this medicine? Side effects that you should report to your doctor or health care professional as soon as  possible: -allergic reactions like skin rash, itching or hives, swelling of the face, lips, or tongue -changes in vision -fever, chills or any other sign of infection -numbness or tingling in legs or other parts of the body -red, scaly patches or raised bumps on the skin -shortness of breath or difficulty breathing -swollen lymph nodes in the neck, underarm, or groin areas -unexplained weight loss -unusual bleeding or bruising -unusual swelling or fluid retention in the legs -unusually weak or tired Side effects that usually do not require medical attention (report to your doctor or health care professional if they  continue or are bothersome): -dizziness -headache -nausea -redness, itching, or swelling at the injection site -vomiting This list may not describe all possible side effects. Call your doctor for medical advice about side effects. You may report side effects to FDA at 1-800-FDA-1088. Where should I keep my medicine? Keep out of the reach of children. Store between 2 and 8 degrees C (36 and 46 degrees F). Do not freeze or shake. Protect from light. Throw away any unused medicine after the expiration date. You will be instructed on how to store this medicine. NOTE: This sheet is a summary. It may not cover all possible information. If you have questions about this medicine, talk to your doctor, pharmacist, or health care provider.  2018 Elsevier/Gold Standard (2011-07-31 15:33:36)

## 2016-08-08 LAB — HIV ANTIBODY (ROUTINE TESTING W REFLEX): HIV 1&2 Ab, 4th Generation: NONREACTIVE

## 2016-08-08 LAB — URINALYSIS, ROUTINE W REFLEX MICROSCOPIC
Bilirubin Urine: NEGATIVE
GLUCOSE, UA: NEGATIVE
HGB URINE DIPSTICK: NEGATIVE
KETONES UR: NEGATIVE
Leukocytes, UA: NEGATIVE
Nitrite: NEGATIVE
PH: 5.5 (ref 5.0–8.0)
Protein, ur: NEGATIVE
Specific Gravity, Urine: 1.019 (ref 1.001–1.035)

## 2016-08-08 LAB — IGG, IGA, IGM
IgA: 156 mg/dL (ref 81–463)
IgG (Immunoglobin G), Serum: 1114 mg/dL (ref 694–1618)
IgM, Serum: 151 mg/dL (ref 48–271)

## 2016-08-08 LAB — HEPATITIS C ANTIBODY: HCV AB: NEGATIVE

## 2016-08-08 LAB — HEPATITIS B SURFACE ANTIGEN: HEP B S AG: NEGATIVE

## 2016-08-08 LAB — HEPATITIS B CORE ANTIBODY, IGM: HEP B C IGM: NONREACTIVE

## 2016-08-10 LAB — QUANTIFERON TB GOLD ASSAY (BLOOD)
Interferon Gamma Release Assay: NEGATIVE
Mitogen-Nil: 7.63 IU/mL
Quantiferon Nil Value: 0.03 IU/mL

## 2016-08-11 LAB — PROTEIN ELECTROPHORESIS, SERUM, WITH REFLEX
Albumin ELP: 4.2 g/dL (ref 3.8–4.8)
Alpha-1-Globulin: 0.3 g/dL (ref 0.2–0.3)
Alpha-2-Globulin: 0.7 g/dL (ref 0.5–0.9)
Beta 2: 0.3 g/dL (ref 0.2–0.5)
Beta Globulin: 0.4 g/dL (ref 0.4–0.6)
Gamma Globulin: 1.1 g/dL (ref 0.8–1.7)
TOTAL PROTEIN, SERUM ELECTROPHOR: 7 g/dL (ref 6.1–8.1)

## 2016-08-11 NOTE — Progress Notes (Signed)
wnl

## 2016-08-29 ENCOUNTER — Telehealth: Payer: Self-pay | Admitting: Pharmacist

## 2016-08-29 NOTE — Telephone Encounter (Signed)
I received a notification from [facility] regarding missed dose of Enbrel as patient had a virus.  I called patient to discuss.  I left a message asking her to call me back.   Elisabeth Most, Pharm.D., BCPS, CPP Clinical Pharmacist Pager: [phone] Phone: [phone] 08/29/2016 4:34 PM

## 2016-09-11 ENCOUNTER — Ambulatory Visit: Payer: BLUE CROSS/BLUE SHIELD | Admitting: Rheumatology

## 2016-11-07 DIAGNOSIS — L4050 Arthropathic psoriasis, unspecified: Secondary | ICD-10-CM | POA: Insufficient documentation

## 2016-11-07 NOTE — Progress Notes (Signed)
Office Visit Note  Patient: Cynthia Snyder             Date of Birth: 1955-05-10           MRN: [id number]             PCP: Chesley Noon, MD Referring: Chesley Noon, MD Visit Date: 11/14/2016 Occupation: @GUAROCC @    Subjective: Joint stiffness.   History of Present Illness: Cynthia Snyder is a 61 y.o. female with history of psoriatic arthritis and psoriasis. She states she's doing fairly well on combination of Enbrel and methotrexate. She has and intermittent stiffness and discomfort in her hands. She denies any psoriasis lesions. She does not have much discomfort in her C-spine.  Activities of Daily Living:  Patient reports morning stiffness for 30-45 minutes.   Patient Reports nocturnal pain.  Difficulty dressing/grooming: Denies Difficulty climbing stairs: Reports Difficulty getting out of chair: Denies Difficulty using hands for taps, buttons, cutlery, and/or writing: Reports   Review of Systems  Constitutional: Positive for fatigue. Negative for night sweats, weight gain, weight loss and weakness.  HENT: Positive for mouth dryness. Negative for mouth sores, trouble swallowing, trouble swallowing and nose dryness.   Eyes: Negative for pain, redness, visual disturbance and dryness.  Respiratory: Negative for cough, shortness of breath and difficulty breathing.   Cardiovascular: Negative for chest pain, palpitations, hypertension, irregular heartbeat and swelling in legs/feet.  Gastrointestinal: Negative for blood in stool, constipation, diarrhea and heartburn.  Endocrine: Negative for excessive thirst and increased urination.  Genitourinary: Negative for painful urination and vaginal dryness.  Musculoskeletal: Positive for arthralgias, joint pain, morning stiffness and muscle tenderness. Negative for joint swelling, myalgias, muscle weakness and myalgias.  Skin: Negative for color change, rash, hair loss, skin tightness, ulcers and sensitivity to sunlight.    Allergic/Immunologic: Negative for susceptible to infections.  Neurological: Negative for dizziness, memory loss and night sweats.  Hematological: Negative for bruising/bleeding tendency and swollen glands.  Psychiatric/Behavioral: Positive for sleep disturbance. Negative for depressed mood. The patient is not nervous/anxious.     PMFS History:  Patient Active Problem List   Diagnosis Date Noted  . Psoriatic arthropathy ([place]) 11/07/2016  . Psoriasis 08/04/2016  . DJD (degenerative joint disease), cervical 08/04/2016  . Esophagitis 08/04/2016  . Restless leg syndrome 08/04/2016  . High risk medication use 08/04/2016  . Postmenopausal atrophic vaginitis 01/22/2015    Class: Chronic    Past Medical History:  Diagnosis Date  . Abnormal Pap smear    many yrs ago  . Endometrial cancer ([place])   . Endometriosis   . Esophagus disorder   . Psoriatic arthritis ([place])   . Shingles   . Shingles     Family History  Problem Relation Age of Onset  . Heart disease Mother   . Hypertension Mother   . Stroke Mother   . Diabetes Mother   . Osteoporosis Mother   . Heart disease Father   . Heart disease Brother   . Cancer Paternal Aunt        ovarian  . Colon cancer Maternal Grandmother    Past Surgical History:  Procedure Laterality Date  . ABDOMINAL HYSTERECTOMY     age 23  . APPENDECTOMY    . BILATERAL SALPINGOOPHORECTOMY Right 11/24/08   lysis of adhesion  . esophageal stretched    . EXPLORATORY LAPAROTOMY    . jaw bone graft  2004  . KNEE ARTHROPLASTY    . KNEE SURGERY    .  LAPAROSCOPY     age 55  . LIVER BIOPSY     mild steatosis   Social History   Social History Narrative  . No narrative on file     Objective: Vital Signs: BP 122/82 (BP Location: Left Arm, Patient Position: Sitting, Cuff Size: Normal)   Pulse 74   Resp 15   Ht 5\' 2"  (1.575 m)   Wt 176 lb (79.8 kg)   LMP 02/06/1982   BMI 32.19 kg/m    Physical Exam  Constitutional: She is oriented to  person, place, and time. She appears well-developed and well-nourished.  HENT:  Head: Normocephalic and atraumatic.  Eyes: Conjunctivae and EOM are normal.  Neck: Normal range of motion.  Cardiovascular: Normal rate, regular rhythm, normal heart sounds and intact distal pulses.   Pulmonary/Chest: Effort normal and breath sounds normal.  Abdominal: Soft. Bowel sounds are normal.  Lymphadenopathy:    She has no cervical adenopathy.  Neurological: She is alert and oriented to person, place, and time.  Skin: Skin is warm and dry. Capillary refill takes less than 2 seconds.  Psychiatric: She has a normal mood and affect. Her behavior is normal.  Nursing note and vitals reviewed.    Musculoskeletal Exam: C-spine  some limitation with range of motion, thoracic and lumbar spine good range of motion. Shoulder joints elbow joints wrist joints are good range of motion. She has synovial thickening over bilateral DIP PIP joints with no synovitis. Hip joints knee joints ankles MTPs PIPs DIPs are good range of motion with no synovitis.  CDAI Exam: CDAI Homunculus Exam:   Joint Counts:  CDAI Tender Joint count: 0 CDAI Swollen Joint count: 0  Global Assessments:  Patient Global Assessment: 4 Provider Global Assessment: 2  CDAI Calculated Score: 6    Investigation: Findings:  08/07/2016 negative TB gold  CBC Latest Ref Rng & Units 08/07/2016 11/25/2013 06/28/2012  WBC 3.8 - 10.8 K/uL 5.6 - -  Hemoglobin 11.7 - 15.5 g/dL 14.7 14.4 14.7  Hematocrit 35.0 - 45.0 % 44.6 - -  Platelets 140 - 400 K/uL 177 - -    CMP Latest Ref Rng & Units 08/07/2016 02/03/2011 06/06/2010  Glucose 65 - 99 mg/dL 83 92 83  BUN 7 - 25 mg/dL 11 18 15   Creatinine 0.50 - 0.99 mg/dL 0.81 0.90 0.9  Sodium 135 - 146 mmol/L 141 144 142  Potassium 3.5 - 5.3 mmol/L 4.1 4.3 4.2  Chloride 98 - 110 mmol/L 106 106 107  CO2 20 - 31 mmol/L 25 - -  Calcium 8.6 - 10.4 mg/dL 9.6 - -  Total Protein 6.1 - 8.1 g/dL 7.0 - -  Total  Bilirubin 0.2 - 1.2 mg/dL 0.6 - -  Alkaline Phos 33 - 130 U/L 82 - -  AST 10 - 35 U/L 18 - -  ALT 6 - 29 U/L 16 - -   Imaging: No results found.  Speciality Comments: No specialty comments available.    Procedures:  No procedures performed Allergies: Patient has no known allergies.   Assessment / Plan:     Visit Diagnoses: Psoriatic arthropathy [facility]): Patient complains of increase arthralgias but she had no synovitis on examination. She does have some synovial thickening.  Psoriasis: She has no active psoriasis lesions.  High risk medication use - Enbrel 50 mg subcutaneous every week, Methotrexate 7 tablets by mouth every week, folic acid 401 g mouth daily -I advised her to increase the folic acid to 2 tablets per day. We  will check her labs today and then every 3 months to monitor for drug toxicity. Plan: CBC with Differential/Platelet, COMPLETE METABOLIC PANEL WITH GFR  DDD (degenerative disc disease), cervical: She has some stiffness.  Her other medical problems are listed as follows:  History of restless legs syndrome  History of esophagitis     Orders: No orders of the defined types were placed in this encounter.  No orders of the defined types were placed in this encounter.     Follow-Up Instructions: Return in about 5 months (around 04/14/2017) for Psoriatic arthritis.   Bo Merino, MD  Note - This record has been created using Editor, commissioning.  Chart creation errors have been sought, but may not always  have been located. Such creation errors do not reflect on  the standard of medical care.

## 2016-11-09 ENCOUNTER — Other Ambulatory Visit: Payer: Self-pay | Admitting: Rheumatology

## 2016-11-09 NOTE — Telephone Encounter (Addendum)
Last Visit: 08/07/16 Next Visit: 11/14/16 Labs: 7/2/18wnl  Okay to per Dr. Estanislado Pandy

## 2016-11-14 ENCOUNTER — Ambulatory Visit (INDEPENDENT_AMBULATORY_CARE_PROVIDER_SITE_OTHER): Payer: BLUE CROSS/BLUE SHIELD | Admitting: Rheumatology

## 2016-11-14 ENCOUNTER — Encounter: Payer: Self-pay | Admitting: Rheumatology

## 2016-11-14 VITALS — BP 122/82 | HR 74 | Resp 15 | Ht 62.0 in | Wt 176.0 lb

## 2016-11-14 DIAGNOSIS — L409 Psoriasis, unspecified: Secondary | ICD-10-CM | POA: Diagnosis not present

## 2016-11-14 DIAGNOSIS — Z8719 Personal history of other diseases of the digestive system: Secondary | ICD-10-CM | POA: Diagnosis not present

## 2016-11-14 DIAGNOSIS — Z79899 Other long term (current) drug therapy: Secondary | ICD-10-CM | POA: Diagnosis not present

## 2016-11-14 DIAGNOSIS — L4050 Arthropathic psoriasis, unspecified: Secondary | ICD-10-CM | POA: Diagnosis not present

## 2016-11-14 DIAGNOSIS — Z8669 Personal history of other diseases of the nervous system and sense organs: Secondary | ICD-10-CM

## 2016-11-14 DIAGNOSIS — M5030 Other cervical disc degeneration, unspecified cervical region: Secondary | ICD-10-CM | POA: Diagnosis not present

## 2016-11-14 LAB — CBC WITH DIFFERENTIAL/PLATELET
BASOS ABS: 41 {cells}/uL (ref 0–200)
Basophils Relative: 0.6 %
EOS ABS: 48 {cells}/uL (ref 15–500)
Eosinophils Relative: 0.7 %
HCT: 43 % (ref 35.0–45.0)
Hemoglobin: 14.7 g/dL (ref 11.7–15.5)
Lymphs Abs: 2319 cells/uL (ref 850–3900)
MCH: 29.8 pg (ref 27.0–33.0)
MCHC: 34.2 g/dL (ref 32.0–36.0)
MCV: 87.2 fL (ref 80.0–100.0)
MPV: 11.4 fL (ref 7.5–12.5)
Monocytes Relative: 7.5 %
Neutro Abs: 3883 cells/uL (ref 1500–7800)
Neutrophils Relative %: 57.1 %
PLATELETS: 194 10*3/uL (ref 140–400)
RBC: 4.93 10*6/uL (ref 3.80–5.10)
RDW: 14.2 % (ref 11.0–15.0)
TOTAL LYMPHOCYTE: 34.1 %
WBC mixed population: 510 cells/uL (ref 200–950)
WBC: 6.8 10*3/uL (ref 3.8–10.8)

## 2016-11-14 LAB — COMPLETE METABOLIC PANEL WITH GFR
AG Ratio: 1.9 (calc) (ref 1.0–2.5)
ALBUMIN MSPROF: 4.5 g/dL (ref 3.6–5.1)
ALKALINE PHOSPHATASE (APISO): 70 U/L (ref 33–130)
ALT: 17 U/L (ref 6–29)
AST: 17 U/L (ref 10–35)
BILIRUBIN TOTAL: 0.7 mg/dL (ref 0.2–1.2)
BUN: 9 mg/dL (ref 7–25)
CHLORIDE: 106 mmol/L (ref 98–110)
CO2: 27 mmol/L (ref 20–32)
Calcium: 9.3 mg/dL (ref 8.6–10.4)
Creat: 0.81 mg/dL (ref 0.50–0.99)
GFR, Est African American: 91 mL/min/{1.73_m2} (ref >=60)
GFR, Est Non African American: 78 mL/min/{1.73_m2} (ref >=60)
GLUCOSE: 78 mg/dL (ref 65–99)
Globulin: 2.4 g/dL (calc) (ref 1.9–3.7)
POTASSIUM: 4 mmol/L (ref 3.5–5.3)
Sodium: 141 mmol/L (ref 135–146)
Total Protein: 6.9 g/dL (ref 6.1–8.1)

## 2016-11-14 NOTE — Progress Notes (Signed)
wnl

## 2016-11-14 NOTE — Patient Instructions (Signed)
Standing Labs We placed an order today for your standing lab work.    Please come back and get your standing labs in January and every 3 months  We have open lab Monday through Friday from 8:30-11:30 AM and 1:30-4 PM at the office of Dr. Fynn Vanblarcom.   The office is located at [address], [address], [place], [place] [address] No appointment is necessary.   Labs are drawn by [facility].  You may receive a bill from [facility] for your lab work. If you have any questions regarding directions or hours of operation,  please call [phone].    

## 2016-11-15 ENCOUNTER — Telehealth: Payer: Self-pay | Admitting: Radiology

## 2016-11-15 NOTE — Telephone Encounter (Signed)
I have called patient to advise labs are normal  

## 2016-11-15 NOTE — Telephone Encounter (Signed)
-----   Message from Bo Merino, MD sent at 11/14/2016 10:38 PM EDT ----- wnl

## 2017-01-29 ENCOUNTER — Other Ambulatory Visit: Payer: Self-pay | Admitting: Rheumatology

## 2017-01-31 NOTE — Telephone Encounter (Signed)
Last Visit: 11/14/16 Next Visit: 04/20/17 Labs: 11/14/16 WNL  Okay to refill per Dr. Estanislado Pandy

## 2017-02-14 ENCOUNTER — Other Ambulatory Visit: Payer: Self-pay | Admitting: Certified Nurse Midwife

## 2017-02-14 DIAGNOSIS — Z1231 Encounter for screening mammogram for malignant neoplasm of breast: Secondary | ICD-10-CM

## 2017-02-16 ENCOUNTER — Ambulatory Visit: Payer: BLUE CROSS/BLUE SHIELD

## 2017-02-16 ENCOUNTER — Ambulatory Visit
Admission: RE | Admit: 2017-02-16 | Discharge: 2017-02-16 | Disposition: A | Discharge status: Home / Self Care | Payer: BLUE CROSS/BLUE SHIELD | Source: Ambulatory Visit | Attending: Certified Nurse Midwife | Admitting: Certified Nurse Midwife

## 2017-02-16 DIAGNOSIS — Z1231 Encounter for screening mammogram for malignant neoplasm of breast: Secondary | ICD-10-CM

## 2017-02-20 ENCOUNTER — Telehealth: Payer: Self-pay | Admitting: Rheumatology

## 2017-02-20 NOTE — Telephone Encounter (Signed)
Please schedule appointment.

## 2017-02-20 NOTE — Telephone Encounter (Signed)
Patient called stating that she went to the urgent care in [facility] and saw [place], [place] for pain in her left leg.  She was told to call our office to schedule an Ultrasound.    Patient's CB# [phone]

## 2017-02-20 NOTE — Telephone Encounter (Signed)
Patient states she has been experiencing some pain in her left leg that has gradually gotten worse over the last 1.5 wks. Patient states the pain has been going on for about three weeks. Patient states the pain goes from below her knee to her shin area. Patient states she feels a warm sensation up her leg in the evening. Patient states there is nothing that aggravates it more. patient states it hurts more when it is still. She states she has tried Advil and a pillow underneath and nothing helps the pain. Patient states she was seen by urgent care in [facility] and she had an x-ray which did not show a break or fracture. Please advise.

## 2017-02-20 NOTE — Telephone Encounter (Signed)
Patient scheduled for 02/26/17 at 1:15 pm

## 2017-02-21 NOTE — Progress Notes (Signed)
Office Visit Note  Patient: Cynthia Snyder             Date of Birth: [birth year]           MRN: [id number]             PCP: Chesley Noon, MD Referring: Chesley Noon, MD Visit Date: 02/26/2017 Occupation: @GUAROCC @    Subjective:  Left shin pain.   History of Present Illness: Cynthia Snyder is a 62 y.o. female with history of psoriatic arthritis and psoriasis. She states she's been having pain and discomfort and the left calf for the last 3-4 weeks. She was seen at [facility] urgent care where she had x-rays of her extremity which was negative per patient. She states the pain could be better enough that  it wakes her up at night. Her psoriatic arthritis is well controlled on combination of Enbrel and methotrexate. She is not having much discomfort in her C-spine. She has some right trochanteric area pain. She denies any lower back pain.  Activities of Daily Living:  Patient reports morning stiffness for 30 minutes.   Patient Reports nocturnal pain.  Difficulty dressing/grooming: Denies Difficulty climbing stairs: Reports Difficulty getting out of chair: Reports Difficulty using hands for taps, buttons, cutlery, and/or writing: Denies   Review of Systems  Constitutional: Positive for fatigue. Negative for night sweats, weight gain, weight loss and weakness.  HENT: Positive for mouth dryness. Negative for mouth sores, trouble swallowing, trouble swallowing and nose dryness.   Eyes: Negative for pain, redness, visual disturbance and dryness.  Respiratory: Negative for cough, shortness of breath and difficulty breathing.   Cardiovascular: Negative for chest pain, palpitations, hypertension, irregular heartbeat and swelling in legs/feet.  Gastrointestinal: Negative for blood in stool, constipation and diarrhea.  Endocrine: Negative for increased urination.  Genitourinary: Negative for vaginal dryness.  Musculoskeletal: Positive for arthralgias, joint pain, myalgias,  morning stiffness and myalgias. Negative for joint swelling, muscle weakness and muscle tenderness.  Skin: Negative for color change, rash, hair loss, skin tightness, ulcers and sensitivity to sunlight.  Allergic/Immunologic: Negative for susceptible to infections.  Neurological: Negative for dizziness, memory loss and night sweats.  Hematological: Negative for swollen glands.  Psychiatric/Behavioral: Positive for sleep disturbance. Negative for depressed mood. The patient is not nervous/anxious.     PMFS History:  Patient Active Problem List   Diagnosis Date Noted  . Psoriatic arthropathy ([place]) 11/07/2016  . Psoriasis 08/04/2016  . DJD (degenerative joint disease), cervical 08/04/2016  . Esophagitis 08/04/2016  . Restless leg syndrome 08/04/2016  . High risk medication use 08/04/2016  . Postmenopausal atrophic vaginitis 01/22/2015    Class: Chronic    Past Medical History:  Diagnosis Date  . Abnormal Pap smear    many yrs ago  . Endometrial cancer ([place])   . Endometriosis   . Esophagus disorder   . Psoriatic arthritis ([place])   . Shingles   . Shingles     Family History  Problem Relation Age of Onset  . Heart disease Mother   . Hypertension Mother   . Stroke Mother   . Diabetes Mother   . Osteoporosis Mother   . Heart disease Father   . Heart disease Brother   . Cancer Paternal Aunt        ovarian  . Colon cancer Maternal Grandmother    Past Surgical History:  Procedure Laterality Date  . ABDOMINAL HYSTERECTOMY     age 25  . APPENDECTOMY    .  BILATERAL SALPINGOOPHORECTOMY Right 11/24/08   lysis of adhesion  . esophageal stretched    . EXPLORATORY LAPAROTOMY    . jaw bone graft  2004  . KNEE ARTHROPLASTY    . KNEE SURGERY    . LAPAROSCOPY     age 27  . LIVER BIOPSY     mild steatosis   Social History   Social History Narrative  . Not on file     Objective: Vital Signs: BP 117/82 (BP Location: Left Arm, Patient Position: Sitting, Cuff Size: Large)    Pulse 84   Resp 12   Wt 179 lb (81.2 kg)   LMP 02/06/1982   BMI 32.74 kg/m    Physical Exam  Constitutional: She is oriented to person, place, and time. She appears well-developed and well-nourished.  HENT:  Head: Normocephalic and atraumatic.  Eyes: Conjunctivae and EOM are normal.  Neck: Normal range of motion.  Cardiovascular: Normal rate, regular rhythm, normal heart sounds and intact distal pulses.  Pulmonary/Chest: Effort normal and breath sounds normal.  Abdominal: Soft. Bowel sounds are normal.  Lymphadenopathy:    She has no cervical adenopathy.  Neurological: She is alert and oriented to person, place, and time.  Skin: Skin is warm and dry. Capillary refill takes less than 2 seconds.  Psychiatric: She has a normal mood and affect. Her behavior is normal.  Nursing note and vitals reviewed.    Musculoskeletal Exam: C-spine and thoracic lumbar spine good range of motion. Shoulder joints although joints wrist joint MCPs PIPs with good range of motion. She has PIP/DIP thickening in her hands and feet consistent with osteoarthritis and psoriatic arthritis but no active synovitis was noted. She had mild tenderness over right trochanteric bursa area. Bilateral knee joints with good range of motion without any warmth swelling or effusion. She is some tenderness on palpation over her left calf .  CDAI Exam: No CDAI exam completed.    Investigation: No additional findings.TB Gold: 08/07/2016 Negative  CBC Latest Ref Rng & Units 11/14/2016 08/07/2016 11/25/2013  WBC 3.8 - 10.8 Thousand/uL 6.8 5.6 -  Hemoglobin 11.7 - 15.5 g/dL 14.7 14.7 14.4  Hematocrit 35.0 - 45.0 % 43.0 44.6 -  Platelets 140 - 400 Thousand/uL 194 177 -   CMP Latest Ref Rng & Units 11/14/2016 08/07/2016 02/03/2011  Glucose 65 - 99 mg/dL 78 83 92  BUN 7 - 25 mg/dL 9 11 18   Creatinine 0.50 - 0.99 mg/dL 0.81 0.81 0.90  Sodium 135 - 146 mmol/L 141 141 144  Potassium 3.5 - 5.3 mmol/L 4.0 4.1 4.3  Chloride 98 - 110  mmol/L 106 106 106  CO2 20 - 32 mmol/L 27 25 -  Calcium 8.6 - 10.4 mg/dL 9.3 9.6 -  Total Protein 6.1 - 8.1 g/dL 6.9 7.0 -  Total Bilirubin 0.2 - 1.2 mg/dL 0.7 0.6 -  Alkaline Phos 33 - 130 U/L - 82 -  AST 10 - 35 U/L 17 18 -  ALT 6 - 29 U/L 17 16 -    Imaging: Mm Digital Screening Bilateral  Result Date: 02/16/2017 CLINICAL DATA:  Screening. EXAM: DIGITAL SCREENING BILATERAL MAMMOGRAM WITH CAD COMPARISON:  Previous exam(s). ACR Breast Density Category b: There are scattered areas of fibroglandular density. FINDINGS: There are no findings suspicious for malignancy. Images were processed with CAD. IMPRESSION: No mammographic evidence of malignancy. A result letter of this screening mammogram will be mailed directly to the patient. RECOMMENDATION: Screening mammogram in one year. (Code:SM-B-01Y) BI-RADS CATEGORY  1:  Negative. Electronically Signed   By: Lajean Manes M.D.   On: 02/16/2017 13:40    Speciality Comments: No specialty comments available.    Procedures:  No procedures performed Allergies: Patient has no known allergies.   Assessment / Plan:     Visit Diagnoses: Pain of left lower extremity: Patient describes pain and discomfort in her left calf and left lower extremity for the last 3-4 weeks. She had evaluation at the urgent care with the x-rays were unremarkable per patient. She was advised to come here to get ultrasound to rule out DVT. She has no synovitis on examination. She does have some calf tenderness. I will schedule ultrasound of her left lower extremity to be evaluated.  Psoriatic arthropathy ([place]): She has no synovitis on examination.  Psoriasis: She does not have any active lesions.  High risk medication use - Enbrel, MTX, folic acid. Her labs have been stable. She has been tolerating her medications well. Her labs are due this month and then every 3 months to monitor for drug toxicity.  DDD (degenerative disc disease), cervical: She does not have much  stiffness with range of motion of her C-spine.  History of esophagitis  History of restless legs syndrome    Orders: No orders of the defined types were placed in this encounter.  No orders of the defined types were placed in this encounter.   Face-to-face time spent with patient was 30 minutes. Greater than 50% of time was spent in counseling and coordination of care.  Follow-Up Instructions: Return in about 5 months (around 07/27/2017) for Psoriatic arthritis, Psoriasis, DDD.   Bo Merino, MD  Note - This record has been created using Editor, commissioning.  Chart creation errors have been sought, but may not always  have been located. Such creation errors do not reflect on  the standard of medical care.

## 2017-02-26 ENCOUNTER — Ambulatory Visit (INDEPENDENT_AMBULATORY_CARE_PROVIDER_SITE_OTHER): Payer: BLUE CROSS/BLUE SHIELD | Admitting: Rheumatology

## 2017-02-26 ENCOUNTER — Other Ambulatory Visit: Payer: Self-pay | Admitting: *Deleted

## 2017-02-26 ENCOUNTER — Encounter: Payer: Self-pay | Admitting: Rheumatology

## 2017-02-26 VITALS — BP 117/82 | HR 84 | Resp 12 | Wt 179.0 lb

## 2017-02-26 DIAGNOSIS — M5030 Other cervical disc degeneration, unspecified cervical region: Secondary | ICD-10-CM | POA: Diagnosis not present

## 2017-02-26 DIAGNOSIS — L4050 Arthropathic psoriasis, unspecified: Secondary | ICD-10-CM

## 2017-02-26 DIAGNOSIS — Z8719 Personal history of other diseases of the digestive system: Secondary | ICD-10-CM | POA: Diagnosis not present

## 2017-02-26 DIAGNOSIS — M79605 Pain in left leg: Secondary | ICD-10-CM

## 2017-02-26 DIAGNOSIS — L409 Psoriasis, unspecified: Secondary | ICD-10-CM | POA: Diagnosis not present

## 2017-02-26 DIAGNOSIS — Z8669 Personal history of other diseases of the nervous system and sense organs: Secondary | ICD-10-CM | POA: Diagnosis not present

## 2017-02-26 DIAGNOSIS — Z79899 Other long term (current) drug therapy: Secondary | ICD-10-CM

## 2017-02-26 LAB — CBC WITH DIFFERENTIAL/PLATELET
Basophils Absolute: 51 cells/uL (ref 0–200)
Basophils Relative: 0.6 %
EOS PCT: 1.9 %
Eosinophils Absolute: 162 cells/uL (ref 15–500)
HCT: 43.7 % (ref 35.0–45.0)
Hemoglobin: 14.7 g/dL (ref 11.7–15.5)
Lymphs Abs: 2508 cells/uL (ref 850–3900)
MCH: 29.3 pg (ref 27.0–33.0)
MCHC: 33.6 g/dL (ref 32.0–36.0)
MCV: 87.1 fL (ref 80.0–100.0)
MPV: 11 fL (ref 7.5–12.5)
Monocytes Relative: 7 %
NEUTROS PCT: 61 %
Neutro Abs: 5185 cells/uL (ref 1500–7800)
Platelets: 195 10*3/uL (ref 140–400)
RBC: 5.02 10*6/uL (ref 3.80–5.10)
RDW: 14.1 % (ref 11.0–15.0)
Total Lymphocyte: 29.5 %
WBC: 8.5 10*3/uL (ref 3.8–10.8)
WBCMIX: 595 {cells}/uL (ref 200–950)

## 2017-02-26 LAB — COMPLETE METABOLIC PANEL WITH GFR
AG Ratio: 1.6 (calc) (ref 1.0–2.5)
ALBUMIN MSPROF: 4.4 g/dL (ref 3.6–5.1)
ALKALINE PHOSPHATASE (APISO): 77 U/L (ref 33–130)
ALT: 15 U/L (ref 6–29)
AST: 16 U/L (ref 10–35)
BILIRUBIN TOTAL: 0.5 mg/dL (ref 0.2–1.2)
BUN: 13 mg/dL (ref 7–25)
CHLORIDE: 107 mmol/L (ref 98–110)
CO2: 29 mmol/L (ref 20–32)
Calcium: 9.5 mg/dL (ref 8.6–10.4)
Creat: 0.77 mg/dL (ref 0.50–0.99)
GFR, Est African American: 96 mL/min/{1.73_m2} (ref >=60)
GFR, Est Non African American: 83 mL/min/{1.73_m2} (ref >=60)
GLOBULIN: 2.7 g/dL (ref 1.9–3.7)
Glucose, Bld: 89 mg/dL (ref 65–99)
POTASSIUM: 4.3 mmol/L (ref 3.5–5.3)
SODIUM: 143 mmol/L (ref 135–146)
Total Protein: 7.1 g/dL (ref 6.1–8.1)

## 2017-02-27 ENCOUNTER — Ambulatory Visit (HOSPITAL_COMMUNITY)
Admission: RE | Admit: 2017-02-27 | Discharge: 2017-02-27 | Disposition: A | Discharge status: Home / Self Care | Payer: BLUE CROSS/BLUE SHIELD | Source: Ambulatory Visit | Attending: Rheumatology | Admitting: Rheumatology

## 2017-02-27 DIAGNOSIS — M79605 Pain in left leg: Secondary | ICD-10-CM | POA: Insufficient documentation

## 2017-02-27 NOTE — Progress Notes (Signed)
wnl

## 2017-02-27 NOTE — Progress Notes (Signed)
LLE venous duplex prelim: negative for DVT. Landry Mellow, RDMS, RVT Attempted call report, left voice message with results.

## 2017-02-28 ENCOUNTER — Telehealth: Payer: Self-pay

## 2017-02-28 NOTE — Telephone Encounter (Signed)
She should contact her PCP for orthopedic surgeon.

## 2017-02-28 NOTE — Telephone Encounter (Signed)
Called patient to advise of negative DVT in left lower extremity and patient is asking if she should have an MRI done? Patient is concerned with the pain she is having.

## 2017-02-28 NOTE — Telephone Encounter (Signed)
Report on DVT in lower extremity is negative, per message received from [facility]. Patient is aware.

## 2017-02-28 NOTE — Telephone Encounter (Signed)
Left message to advise patient she should see her PCP or orthopedic surgeon.

## 2017-03-02 ENCOUNTER — Other Ambulatory Visit: Payer: Self-pay

## 2017-03-02 ENCOUNTER — Ambulatory Visit (INDEPENDENT_AMBULATORY_CARE_PROVIDER_SITE_OTHER): Payer: BLUE CROSS/BLUE SHIELD | Admitting: Certified Nurse Midwife

## 2017-03-02 ENCOUNTER — Other Ambulatory Visit (HOSPITAL_COMMUNITY)
Admission: RE | Admit: 2017-03-02 | Discharge: 2017-03-02 | Disposition: A | Discharge status: Home / Self Care | Payer: BLUE CROSS/BLUE SHIELD | Source: Ambulatory Visit | Attending: Certified Nurse Midwife | Admitting: Certified Nurse Midwife

## 2017-03-02 ENCOUNTER — Encounter: Payer: Self-pay | Admitting: Certified Nurse Midwife

## 2017-03-02 VITALS — BP 140/70 | HR 68 | Resp 16 | Ht 61.5 in | Wt 176.0 lb

## 2017-03-02 DIAGNOSIS — Z01419 Encounter for gynecological examination (general) (routine) without abnormal findings: Secondary | ICD-10-CM | POA: Diagnosis not present

## 2017-03-02 DIAGNOSIS — Z8542 Personal history of malignant neoplasm of other parts of uterus: Secondary | ICD-10-CM

## 2017-03-02 DIAGNOSIS — N951 Menopausal and female climacteric states: Secondary | ICD-10-CM | POA: Diagnosis not present

## 2017-03-02 DIAGNOSIS — Z124 Encounter for screening for malignant neoplasm of cervix: Secondary | ICD-10-CM | POA: Insufficient documentation

## 2017-03-02 NOTE — Patient Instructions (Signed)

## 2017-03-02 NOTE — Progress Notes (Signed)
62 y.o. G108P2002 Married  Caucasian Fe here for annual exam. Denies vaginal bleeding or vaginal dryness. Using OTC moisturizer  with good response. Sees Dr. Nevada Crane rheumatology/labs, and vitamin D management. Currently has left leg evaluation in process for tenderness and edema, DVT ruled out one day ago. Sees Dr Melford Aase for aex and PRN. Planning on semi-retirement to spend more time with parents and family. No other health issues today.  Patient's last menstrual period was 02/06/1982.          Sexually active: Yes.    The current method of family planning is status post hysterectomy.    For endometrial cancer history Exercising: No.  exercise Smoker:  no  Health Maintenance: Pap:  01-22-15 neg, 03-01-16 neg sHistory of Abnormal Pap: yes MMG:  02-16-17 category b density birads 1:neg Self Breast exams: yes Colonoscopy:  2013 f/u 7yrs due to polyp hx BMD:   Unsure of when last one was other than 2015, will schedule today TDaP:  2014 Shingles: had done, had shingles Pneumonia: had done Hep C and HIV: both neg 2018 Labs: no Flu Vaccine yes   reports that  has never smoked. she has never used smokeless tobacco. She reports that she does not drink alcohol or use drugs.  Past Medical History:  Diagnosis Date  . Abnormal Pap smear    many yrs ago  . Endometrial cancer ([place])   . Endometriosis   . Esophagus disorder   . Psoriatic arthritis ([place])   . Shingles   . Shingles     Past Surgical History:  Procedure Laterality Date  . ABDOMINAL HYSTERECTOMY     age 56  . APPENDECTOMY    . BILATERAL SALPINGOOPHORECTOMY Right 11/24/08   lysis of adhesion  . esophageal stretched    . EXPLORATORY LAPAROTOMY    . jaw bone graft  2004  . KNEE ARTHROPLASTY    . KNEE SURGERY    . LAPAROSCOPY     age 24  . LIVER BIOPSY     mild steatosis    Current Outpatient Medications  Medication Sig Dispense Refill  . Calcium 1500 MG tablet Take by mouth.    . Cholecalciferol (VITAMIN D PO) Take  by mouth daily.    Scarlette Shorts SURECLICK 50 MG/ML injection   2  . folic acid (FOLVITE) 528 MCG tablet Take 800 mcg by mouth.    Marland Kitchen ibuprofen (ADVIL,MOTRIN) 200 MG tablet Take 400 mg by mouth every 6 (six) hours as needed. For pain     . methotrexate (RHEUMATREX) 2.5 MG tablet 2.5 mg 2 (two) times a week. Takes 4 tablets on Saturday & 3 on sunday    . Multiple Vitamin (MULITIVITAMIN WITH MINERALS) TABS Take 1 tablet by mouth daily.      Marland Kitchen omeprazole (PRILOSEC) 40 MG capsule Take 40 mg by mouth as needed.  0  . valACYclovir (VALTREX) 1000 MG tablet as needed.  0   No current facility-administered medications for this visit.     Family History  Problem Relation Age of Onset  . Heart disease Mother   . Hypertension Mother   . Stroke Mother   . Diabetes Mother   . Osteoporosis Mother   . Heart disease Father   . Heart disease Brother   . Cancer Paternal Aunt        ovarian  . Colon cancer Maternal Grandmother     ROS:  Pertinent items are noted in HPI.  Otherwise, a comprehensive ROS was negative.  Exam:   BP 140/70   Pulse 68   Resp 16   Ht 5' 1.5" (1.562 m)   Wt 176 lb (79.8 kg)   LMP 02/06/1982   BMI 32.72 kg/m  Height: 5' 1.5" (156.2 cm) Ht Readings from Last 3 Encounters:  03/02/17 5' 1.5" (1.562 m)  11/14/16 5\' 2"  (1.575 m)  08/07/16 5\' 2"  (1.575 m)    General appearance: alert, cooperative and appears stated age Head: Normocephalic, without obvious abnormality, atraumatic Neck: no adenopathy, supple, symmetrical, trachea midline and thyroid normal to inspection and palpation Lungs: clear to auscultation bilaterally Breasts: normal appearance, no masses or tenderness, No nipple retraction or dimpling, No nipple discharge or bleeding, No axillary or supraclavicular adenopathy Heart: regular rate and rhythm Abdomen: soft, non-tender; no masses,  no organomegaly Extremities: extremities normal, atraumatic, no cyanosis or edema Skin: Skin color, texture, turgor normal. No  rashes or lesions Lymph nodes: Cervical, supraclavicular, and axillary nodes normal. No abnormal inguinal nodes palpated Neurologic: Grossly normal   Pelvic: External genitalia:  no lesions              Urethra:  normal appearing urethra with no masses, tenderness or lesions              Bartholin's and Skene's: normal                 Vagina: normal appearing vagina with normal color and discharge, no lesions              Cervix: absent              Pap taken: Yes.   Bimanual Exam:  Uterus:  uterus absent              Adnexa: no mass, fullness, tenderness and adnexa surgically absent               Rectovaginal: Confirms               Anus:  normal sphincter tone, no lesions  Chaperone present: yes  A:  Well Woman with normal exam  Menopausal no HRT s/P TAH with BSO endometrial cancer history  Atrophic vaginitis with OTC products with good results  RA management with MD, Sees PCP yearly also  BMD due  P:   Reviewed health and wellness pertinent to exam  Aware of need to evaluate if vaginal bleeding or if dryness issues occur  Continue follow up MD's as indicated  Discussed BMD due patient will call and schedule with Breast center  Pap smear: yes   counseled on breast self exam, mammography screening, feminine hygiene, osteoporosis, adequate intake of calcium and vitamin D, diet and exercise  return annually or prn  An After Visit Summary was printed and given to the patient.

## 2017-03-06 LAB — CYTOLOGY - PAP: DIAGNOSIS: NEGATIVE

## 2017-03-23 ENCOUNTER — Telehealth: Payer: Self-pay | Admitting: Rheumatology

## 2017-03-23 NOTE — Telephone Encounter (Signed)
Patient calling to request a report of the Ultrasound she had done with [place]. Please call to advise.

## 2017-03-23 NOTE — Telephone Encounter (Signed)
Report printed and patient advised. She will come by the office after lunch to pick up.

## 2017-03-26 DIAGNOSIS — M25551 Pain in right hip: Secondary | ICD-10-CM | POA: Insufficient documentation

## 2017-04-16 ENCOUNTER — Other Ambulatory Visit: Payer: Self-pay

## 2017-04-16 ENCOUNTER — Ambulatory Visit (INDEPENDENT_AMBULATORY_CARE_PROVIDER_SITE_OTHER): Payer: BLUE CROSS/BLUE SHIELD | Admitting: Obstetrics and Gynecology

## 2017-04-16 ENCOUNTER — Encounter: Payer: Self-pay | Admitting: Obstetrics and Gynecology

## 2017-04-16 VITALS — BP 110/78 | HR 88 | Temp 97.8°F | Resp 16 | Ht 62.0 in | Wt 174.0 lb

## 2017-04-16 DIAGNOSIS — R309 Painful micturition, unspecified: Secondary | ICD-10-CM | POA: Diagnosis not present

## 2017-04-16 LAB — POCT URINALYSIS DIPSTICK
Bilirubin, UA: NEGATIVE
Blood, UA: 1
Glucose, UA: NEGATIVE
Ketones, UA: NEGATIVE
NITRITE UA: NEGATIVE
PH UA: 5 (ref 5.0–8.0)
Urobilinogen, UA: 0.2 E.U./dL

## 2017-04-16 MED ORDER — PHENAZOPYRIDINE HCL 100 MG PO TABS: 100.0000 mg | ORAL_TABLET | Freq: Three times a day (TID) | ORAL | 0 refills | Status: DC | PRN

## 2017-04-16 MED ORDER — NITROFURANTOIN MONOHYD MACRO 100 MG PO CAPS: ORAL_CAPSULE | ORAL | 0 refills | Status: DC

## 2017-04-16 NOTE — Progress Notes (Signed)
GYNECOLOGY  VISIT   HPI: 62 y.o.   Married  Caucasian  female   G2P2002 with Patient's last menstrual period was 02/06/1982.   here for  Possible kidney infection; patient states that she has had a vaginal odor since last Tuesday and pain with urination since Thursday. Urine results have been recorded.  Having frequency, urgency, and pain and burning with urination.  Having chills for 2 days.  No known fever.  Vomiting and decreased appetite.  Right sided back pain for a while per patient.  Had a recent right hip injection around the same time.   Has self treated with Monistat for 3 days. Did not help.   On MTX and Embrel.   Stopped Diclofenac when she received the cortisone injection.  Took this NSAID for 2 weeks.   Urine dip - 1+ WBCs, 1+ blood, trace protein, cloudy, small odor.   GYNECOLOGIC HISTORY: Patient's last menstrual period was 02/06/1982. Contraception:  Hysterectomy Menopausal hormone therapy:  none Last mammogram:  02-16-17 category b density birads 1:neg Last pap smear:   03/02/17 Pap smear Negative        OB History    Gravida Para Term Preterm AB Living   2 2 2     2    SAB TAB Ectopic Multiple Live Births                     Patient Active Problem List   Diagnosis Date Noted  . 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 Psoriasis 08/04/2016  . DJD (degenerative joint disease), cervical 08/04/2016  . Esophagitis 08/04/2016  . Restless leg syndrome 08/04/2016  . High risk medication use 08/04/2016  . Atrophic vaginitis 01/22/2015    Class: Chronic    Past Medical History:  Diagnosis Date  . Abnormal Pap smear    many yrs ago  . Endometrial cancer ([place])   . Endometriosis   . Esophagus disorder   . Psoriatic arthritis ([place])   . Shingles   . Shingles     Past Surgical History:  Procedure Laterality Date  . ABDOMINAL HYSTERECTOMY     age 13  . APPENDECTOMY    . BILATERAL SALPINGOOPHORECTOMY Right 11/24/08   lysis of adhesion  . esophageal  stretched    . EXPLORATORY LAPAROTOMY    . jaw bone graft  2004  . KNEE ARTHROPLASTY    . KNEE SURGERY    . LAPAROSCOPY     age 44  . LIPOSUCTION TRUNK  04/06/2016   Dr. Lenon Curt for body image  . LIVER BIOPSY     mild steatosis    Current Outpatient Medications  Medication Sig Dispense Refill  . Calcium 1500 MG tablet Take by mouth.    . diclofenac (VOLTAREN) 75 MG EC tablet TAKE 1 TABLET BY MOUTH TWICE A DAY    . ENBREL SURECLICK 50 MG/ML injection   2  . folic acid (FOLVITE) 102 MCG tablet Take 800 mcg by mouth.    Marland Kitchen ibuprofen (ADVIL,MOTRIN) 200 MG tablet Take 400 mg by mouth every 6 (six) hours as needed. For pain     . methotrexate (RHEUMATREX) 2.5 MG tablet 2.5 mg 2 (two) times a week. Takes 4 tablets on Saturday & 3 on sunday    . Multiple Vitamin (MULITIVITAMIN WITH MINERALS) TABS Take 1 tablet by mouth daily.      Marland Kitchen omeprazole (PRILOSEC) 40 MG capsule Take 40 mg by mouth as needed.  0  . valACYclovir (VALTREX) 1000 MG  tablet as needed.  0  . Cholecalciferol (VITAMIN D PO) Take by mouth daily.     No current facility-administered medications for this visit.      ALLERGIES: Patient has no known allergies.  Family History  Problem Relation Age of Onset  . Heart disease Mother   . Hypertension Mother   . Stroke Mother   . Diabetes Mother   . Osteoporosis Mother   . Heart disease Father   . Heart disease Brother   . Cancer Paternal Aunt        ovarian  . Colon cancer Maternal Grandmother     Social History   Socioeconomic History  . Marital status: Married    Spouse name: Not on file  . Number of children: Not on file  . Years of education: Not on file  . Highest education level: Not on file  Social Needs  . Financial resource strain: Not on file  . Food insecurity - worry: Not on file  . Food insecurity - inability: Not on file  . Transportation needs - medical: Not on file  . Transportation needs - non-medical: Not on file  Occupational History  . Not on  file  Tobacco Use  . Smoking status: Never Smoker  . Smokeless tobacco: Never Used  Substance and Sexual Activity  . Alcohol use: No  . Drug use: No  . Sexual activity: Yes    Partners: Male    Birth control/protection: Surgical    Comment: TAH  Other Topics Concern  . Not on file  Social History Narrative  . Not on file    ROS:  Pertinent items are noted in HPI.  PHYSICAL EXAMINATION:    BP 110/78 (BP Location: Right Arm, Patient Position: Sitting, Cuff Size: Normal)   Pulse 88   Temp 97.8 F (36.6 C) (Oral)   Resp 16   Ht 5\' 2"  (1.575 m)   Wt 174 lb (78.9 kg)   LMP 02/06/1982   BMI 31.83 kg/m     General appearance: alert, cooperative and appears stated age Lungs: clear to auscultation bilaterally Heart: regular rate and rhythm Abdomen: soft, non-tender, no masses,  no organomegaly Back:  No CVA tenderness. No abnormal inguinal nodes palpated  Pelvic: External genitalia:  no lesions              Urethra:  normal appearing urethra with no masses, tenderness or lesions              Bartholins and Skenes: normal                 Vagina: normal appearing vagina with normal color and discharge, no lesions              Cervix:  absent                Bimanual Exam:  Uterus:   Absent.              Adnexa: no mass, fullness, tenderness           Chaperone was present for exam.  ASSESSMENT  UTI.   Upper tract signs and symptoms.  Unable to take Bactrim or Cipro due to MTX.   PLAN  Urine micro and culture.  Macrobid 100 mg po bid x 7 days.  Pyridium 100 mg bo tid prn x 3 days.  Return for persistent or worsening symptoms.    An After Visit Summary was printed and given to the patient.  ____15__  minutes face to face time of which over 50% was spent in counseling.

## 2017-04-16 NOTE — Patient Instructions (Signed)

## 2017-04-17 LAB — URINALYSIS, MICROSCOPIC ONLY
Casts: NONE SEEN /lpf
WBC, UA: >30 /hpf — AB (ref 0–?)

## 2017-04-18 LAB — URINE CULTURE

## 2017-04-19 ENCOUNTER — Telehealth: Payer: Self-pay | Admitting: Rheumatology

## 2017-04-19 ENCOUNTER — Telehealth: Payer: Self-pay | Admitting: *Deleted

## 2017-04-19 NOTE — Telephone Encounter (Signed)
Patient returned call to nurse Sharee Pimple. Please call back at: [phone].

## 2017-04-19 NOTE — Telephone Encounter (Signed)
Louis from [employer] called regarding the prior authorization needed for patient's Enbrel.  He states he is [place] because the medicine was rejected.  An authorization form was faxed on 04/09/17 and they are still waiting for a response.  He states he will send another fax today 3/14 and would like someone to return the call to [phone].  I confirmed that he has the correct fax number for our office.

## 2017-04-19 NOTE — Telephone Encounter (Signed)
Attempted work number, [phone], line busy.   Call placed to home number, Left message to call Sharee Pimple at [phone].

## 2017-04-19 NOTE — Telephone Encounter (Signed)
Notes recorded by Burnice Logan, RN on 04/19/2017 at 10:21 AM EDT Left message to call Sharee Pimple at [phone].

## 2017-04-19 NOTE — Telephone Encounter (Signed)
If patient is not improved, she needs an office visit.

## 2017-04-19 NOTE — Telephone Encounter (Signed)
Returned call to cover my meds. Spoke with [place] who states that a prior authorization for Enbrel was faxed to the office and was checking to see if we received it. We have not received the faxed request.   Authorization for Enbrel has been submitted to pts insurance via cover my  meds using the "key" [facility]) given by representative. Will update once we have a response.   Alexsandria Kivett, [place], CPhT 11:23 AM

## 2017-04-19 NOTE — Telephone Encounter (Signed)
-----   Message from Cynthia Cobbs, MD sent at 04/18/2017 10:26 PM EDT ----- Please contact patient with results of UC showing E Coli.  The Macrobid I prescribed should treat the infection well. Let me know how she is doing with the treatment.

## 2017-04-19 NOTE — Telephone Encounter (Signed)
Call returned to work number, attempted previously, line busy, our office phones are off. Spoke with patients spouse, "Cynthia Snyder", ok per dpr. Advised as seen below per Dr. Quincy Simmonds. Spouse states patient not available, "still having problems". Advised spouse to have patient to return call to office, office is closed, phones come back on at [place] understanding and is agreeable.   Routing to Dr. Antony Blackbird.

## 2017-04-20 ENCOUNTER — Ambulatory Visit: Payer: BLUE CROSS/BLUE SHIELD | Admitting: Rheumatology

## 2017-04-20 NOTE — Telephone Encounter (Signed)
Spoke with patient. Reports she is feeling better, odor has "just about resolved" and no pain with urination. Has 2 days left of antibiotics. Advised to complete antibiotics, return call to office with any additional questions/concerns.   Routing to provider for final review. Patient is agreeable to disposition. Will close encounter.

## 2017-04-23 ENCOUNTER — Encounter: Payer: Self-pay | Admitting: Obstetrics and Gynecology

## 2017-04-23 ENCOUNTER — Ambulatory Visit (INDEPENDENT_AMBULATORY_CARE_PROVIDER_SITE_OTHER): Payer: BLUE CROSS/BLUE SHIELD | Admitting: Obstetrics and Gynecology

## 2017-04-23 ENCOUNTER — Telehealth: Payer: Self-pay | Admitting: Obstetrics and Gynecology

## 2017-04-23 ENCOUNTER — Other Ambulatory Visit: Payer: Self-pay

## 2017-04-23 VITALS — BP 140/80 | HR 78 | Resp 14 | Wt 174.4 lb

## 2017-04-23 DIAGNOSIS — R8290 Unspecified abnormal findings in urine: Secondary | ICD-10-CM | POA: Diagnosis not present

## 2017-04-23 NOTE — Progress Notes (Signed)
GYNECOLOGY  VISIT   HPI: 62 y.o.   Married  Caucasian  female   G2P2002 with Patient's last menstrual period was 02/06/1982.   Patient states that she is not having any symptoms of UTI, however wanted to come in just to ensure that UTI has gone.     Wants a refill of the prescription of abx to be sure the infection is gone.   No pain with urination.  No discharge.  Some odor in the urine that worried her.   UTI with E Coli treated with Macrobid.   Urine dip today normal.  GYNECOLOGIC HISTORY: Patient's last menstrual period was 02/06/1982. Contraception:  Hysterectomy Menopausal hormone therapy:  none Last mammogram:  02-16-17 category b density birads 1:neg Last pap smear:   03/02/17 Pap smear Negative        OB History    Gravida Para Term Preterm AB Living   2 2 2     2    SAB TAB Ectopic Multiple Live Births                     Patient Active Problem List   Diagnosis Date Noted  . 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 Psoriasis 08/04/2016  . DJD (degenerative joint disease), cervical 08/04/2016  . Esophagitis 08/04/2016  . Restless leg syndrome 08/04/2016  . High risk medication use 08/04/2016  . Atrophic vaginitis 01/22/2015    Class: Chronic    Past Medical History:  Diagnosis Date  . Abnormal Pap smear    many yrs ago  . Endometrial cancer ([place])   . Endometriosis   . Esophagus disorder   . Psoriatic arthritis ([place])   . Shingles   . Shingles     Past Surgical History:  Procedure Laterality Date  . ABDOMINAL HYSTERECTOMY     age 44  . APPENDECTOMY    . BILATERAL SALPINGOOPHORECTOMY Right 11/24/08   lysis of adhesion  . esophageal stretched    . EXPLORATORY LAPAROTOMY    . jaw bone graft  2004  . KNEE ARTHROPLASTY    . KNEE SURGERY    . LAPAROSCOPY     age 41  . LIPOSUCTION TRUNK  04/06/2016   Dr. Lenon Curt for body image  . LIVER BIOPSY     mild steatosis    Current Outpatient Medications  Medication Sig Dispense Refill  . Calcium 1500  MG tablet Take by mouth.    . Cholecalciferol (VITAMIN D PO) Take by mouth daily.    . diclofenac (VOLTAREN) 75 MG EC tablet TAKE 1 TABLET BY MOUTH TWICE A DAY    . ENBREL SURECLICK 50 MG/ML injection   2  . folic acid (FOLVITE) 423 MCG tablet Take 800 mcg by mouth.    Marland Kitchen ibuprofen (ADVIL,MOTRIN) 200 MG tablet Take 400 mg by mouth every 6 (six) hours as needed. For pain     . methotrexate (RHEUMATREX) 2.5 MG tablet 2.5 mg 2 (two) times a week. Takes 4 tablets on Saturday & 3 on sunday    . Multiple Vitamin (MULITIVITAMIN WITH MINERALS) TABS Take 1 tablet by mouth daily.      . nitrofurantoin, macrocrystal-monohydrate, (MACROBID) 100 MG capsule Take one capsule BID x 7 days. 14 capsule 0  . omeprazole (PRILOSEC) 40 MG capsule Take 40 mg by mouth as needed.  0  . valACYclovir (VALTREX) 1000 MG tablet as needed.  0   No current facility-administered medications for this visit.      ALLERGIES: Patient  has no known allergies.  Family History  Problem Relation Age of Onset  . Heart disease Mother   . Hypertension Mother   . Stroke Mother   . Diabetes Mother   . Osteoporosis Mother   . Heart disease Father   . Heart disease Brother   . Cancer Paternal Aunt        ovarian  . Colon cancer Maternal Grandmother     Social History   Socioeconomic History  . Marital status: Married    Spouse name: Not on file  . Number of children: Not on file  . Years of education: Not on file  . Highest education level: Not on file  Social Needs  . Financial resource strain: Not on file  . Food insecurity - worry: Not on file  . Food insecurity - inability: Not on file  . Transportation needs - medical: Not on file  . Transportation needs - non-medical: Not on file  Occupational History  . Not on file  Tobacco Use  . Smoking status: Never Smoker  . Smokeless tobacco: Never Used  Substance and Sexual Activity  . Alcohol use: No  . Drug use: No  . Sexual activity: Yes    Partners: Male     Birth control/protection: Surgical    Comment: TAH  Other Topics Concern  . Not on file  Social History Narrative  . Not on file    ROS:  Pertinent items are noted in HPI.  PHYSICAL EXAMINATION:    BP 140/80 (BP Location: Right Arm, Patient Position: Sitting, Cuff Size: Normal)   Pulse 78   Resp 14   Wt 174 lb 6.4 oz (79.1 kg)   LMP 02/06/1982   BMI 31.90 kg/m     General appearance: alert, cooperative and appears stated age   ASSESSMENT  Urine odor.  Status post recent UTI tx.   PLAN  Dicussed abx use for treating infections in general.  Will resend UC.  Abx only if infection confirmed.   An After Visit Summary was printed and given to the patient.  __10____ minutes face to face time of which over 50% was spent in counseling.

## 2017-04-23 NOTE — Telephone Encounter (Signed)
Spoke with patient. Patient states that she is still having UTI symptoms after completing 7 days of Macrobid. Requesting a refill. Advised will need to be seen in the office for further evaluation. Patient is agreeable. Appointment scheduled for 04/23/2017 at 1:30 pm with Dr.Silva. Patient is agreeable to date and time.  Routing to provider for final review. Patient agreeable to disposition. Will close encounter.

## 2017-04-23 NOTE — Telephone Encounter (Signed)
Patient would like Dr Quincy Simmonds to authorize another prescription for macrobid 100 mg. [facility] [place] at [phone].

## 2017-04-24 LAB — URINE CULTURE: Organism ID, Bacteria: NO GROWTH

## 2017-04-27 NOTE — Telephone Encounter (Signed)
Called [employer] to check the status of application. ([phone]). Spoke with Jacquelynn Cree B who states that the authorization has been cancelled because the pt has an authorization on file from 05/20/2015 through the life of the policy. Pt gets Enbrel [facility] at [facility]. . An approval letter will be faxed to the clinic.  Will send document to scan center.  Leilanie Rauda, [place], CPhT 11:46 AM

## 2017-06-03 ENCOUNTER — Other Ambulatory Visit: Payer: Self-pay | Admitting: Rheumatology

## 2017-06-04 NOTE — Telephone Encounter (Addendum)
Last Visit: 02/26/17 Next visit: 07/30/17 Labs: 02/26/17 WNL  Patient advised she is due to update labs. Patient will update 06/08/17.  Okay to refill 30 day supply per Dr. Estanislado Pandy

## 2017-06-12 ENCOUNTER — Other Ambulatory Visit: Payer: Self-pay

## 2017-06-12 DIAGNOSIS — Z79899 Other long term (current) drug therapy: Secondary | ICD-10-CM

## 2017-06-12 LAB — CBC WITH DIFFERENTIAL/PLATELET
BASOS ABS: 72 {cells}/uL (ref 0–200)
Basophils Relative: 1.1 %
EOS ABS: 130 {cells}/uL (ref 15–500)
Eosinophils Relative: 2 %
HEMATOCRIT: 41.7 % (ref 35.0–45.0)
Hemoglobin: 14.3 g/dL (ref 11.7–15.5)
LYMPHS ABS: 2360 {cells}/uL (ref 850–3900)
MCH: 30.2 pg (ref 27.0–33.0)
MCHC: 34.3 g/dL (ref 32.0–36.0)
MCV: 88 fL (ref 80.0–100.0)
MPV: 11.1 fL (ref 7.5–12.5)
Monocytes Relative: 7.7 %
Neutro Abs: 3439 cells/uL (ref 1500–7800)
Neutrophils Relative %: 52.9 %
Platelets: 199 10*3/uL (ref 140–400)
RBC: 4.74 10*6/uL (ref 3.80–5.10)
RDW: 14.7 % (ref 11.0–15.0)
Total Lymphocyte: 36.3 %
WBC: 6.5 10*3/uL (ref 3.8–10.8)
WBCMIX: 501 {cells}/uL (ref 200–950)

## 2017-06-12 LAB — COMPLETE METABOLIC PANEL WITH GFR
AG Ratio: 1.9 (calc) (ref 1.0–2.5)
ALT: 28 U/L (ref 6–29)
AST: 29 U/L (ref 10–35)
Albumin: 4.3 g/dL (ref 3.6–5.1)
Alkaline phosphatase (APISO): 67 U/L (ref 33–130)
BUN: 12 mg/dL (ref 7–25)
CO2: 29 mmol/L (ref 20–32)
CREATININE: 0.85 mg/dL (ref 0.50–0.99)
Calcium: 9.1 mg/dL (ref 8.6–10.4)
Chloride: 107 mmol/L (ref 98–110)
GFR, EST AFRICAN AMERICAN: 85 mL/min/{1.73_m2} (ref >=60)
GFR, Est Non African American: 73 mL/min/{1.73_m2} (ref >=60)
GLUCOSE: 88 mg/dL (ref 65–99)
Globulin: 2.3 g/dL (calc) (ref 1.9–3.7)
Potassium: 3.9 mmol/L (ref 3.5–5.3)
Sodium: 142 mmol/L (ref 135–146)
TOTAL PROTEIN: 6.6 g/dL (ref 6.1–8.1)
Total Bilirubin: 0.6 mg/dL (ref 0.2–1.2)

## 2017-07-08 ENCOUNTER — Other Ambulatory Visit: Payer: Self-pay | Admitting: Rheumatology

## 2017-07-09 NOTE — Telephone Encounter (Signed)
Last Visit: 02/26/17 Next visit: 07/30/17 Labs: 06/12/17 WNL  Okay to refill per Dr. Estanislado Pandy

## 2017-07-13 DIAGNOSIS — B009 Herpesviral infection, unspecified: Secondary | ICD-10-CM | POA: Insufficient documentation

## 2017-07-16 NOTE — Progress Notes (Signed)
Office Visit Note  Patient: Cynthia Snyder             Date of Birth: Jun 10, 1955           MRN: [id number]             PCP: Chesley Noon, MD Referring: Chesley Noon, MD Visit Date: 07/30/2017 Occupation: @GUAROCC @    Subjective:  Follow-up (BIL LAT LEG PAINFROM KNEES DOWN TO FEET, )   History of Present Illness: Cynthia Snyder is a 62 y.o. female with history of psoriatic arthritis.  She states she has been on Enbrel every other week since the last visit.  She has noticed some intermittent swelling in her hands.  She states she has been also having difficulty walking due to lower extremity weakness but she has not noticed any swelling in her knees.  She denies any flare of psoriasis.  Activities of Daily Living:  Patient reports morning stiffness for 30 minutes.   Patient Reports nocturnal pain.  Difficulty dressing/grooming: Denies Difficulty climbing stairs: Reports Difficulty getting out of chair: Reports Difficulty using hands for taps, buttons, cutlery, and/or writing: Denies   Review of Systems  Constitutional: Positive for fatigue. Negative for fever.  HENT: Negative for ear pain.   Eyes: Negative for pain.  Respiratory: Negative for cough and shortness of breath.   Cardiovascular: Negative for swelling in legs/feet.  Gastrointestinal: Negative for constipation and diarrhea.  Genitourinary: Negative for difficulty urinating.  Musculoskeletal: Positive for arthralgias, joint pain, myalgias, muscle weakness, morning stiffness and myalgias. Negative for joint swelling.  Skin: Positive for rash. Negative for hair loss and sensitivity to sunlight.  Neurological: Positive for weakness. Negative for numbness.  Hematological: Negative for bruising/bleeding tendency.  Psychiatric/Behavioral: Positive for sleep disturbance.    PMFS History:  Patient Active Problem List   Diagnosis Date Noted  . 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 Psoriasis 08/04/2016    . DJD (degenerative joint disease), cervical 08/04/2016  . Esophagitis 08/04/2016  . Restless leg syndrome 08/04/2016  . High risk medication use 08/04/2016  . Atrophic vaginitis 01/22/2015    Class: Chronic    Past Medical History:  Diagnosis Date  . Abnormal Pap smear    many yrs ago  . Endometrial cancer (Cynthia Snyder)   . Endometriosis   . Esophagus disorder   . Psoriatic arthritis (Cynthia Snyder)   . Shingles   . Shingles     Family History  Problem Relation Age of Onset  . Heart disease Mother   . Hypertension Mother   . Stroke Mother   . Diabetes Mother   . Osteoporosis Mother   . Heart disease Father   . Heart disease Brother   . Cancer Paternal Aunt        ovarian  . Colon cancer Maternal Grandmother    Past Surgical History:  Procedure Laterality Date  . ABDOMINAL HYSTERECTOMY     age 65  . APPENDECTOMY    . BILATERAL SALPINGOOPHORECTOMY Right 11/24/08   lysis of adhesion  . esophageal stretched    . EXPLORATORY LAPAROTOMY    . jaw bone graft  2004  . KNEE ARTHROPLASTY    . KNEE SURGERY    . LAPAROSCOPY     age 26  . LIPOSUCTION TRUNK  04/06/2016   Dr. Lenon Curt for body image  . LIVER BIOPSY     mild steatosis   Social History   Social History Narrative  . Not on file  Objective: Vital Signs: BP 119/89 (BP Location: Right Arm, Patient Position: Sitting, Cuff Size: Normal)   Pulse 87   Ht 5\' 2"  (1.575 m)   Wt 176 lb (79.8 kg)   LMP 02/06/1982   BMI 32.19 kg/m    Physical Exam  Constitutional: She is oriented to person, place, and time. She appears well-developed and well-nourished.  HENT:  Head: Normocephalic and atraumatic.  Eyes: Conjunctivae and EOM are normal.  Neck: Normal range of motion.  Cardiovascular: Normal rate, regular rhythm, normal heart sounds and intact distal pulses.  Pulmonary/Chest: Effort normal and breath sounds normal.  Abdominal: Soft. Bowel sounds are normal.  Lymphadenopathy:    She has no cervical adenopathy.   Neurological: She is alert and oriented to person, place, and time.  Skin: Skin is warm and dry. Capillary refill takes less than 2 seconds.  Psychiatric: She has a normal mood and affect. Her behavior is normal.  Nursing note and vitals reviewed.    Musculoskeletal Exam: C-spine thoracic lumbar spine good range of motion.  Shoulder joints elbow joints wrist joints were in good range of motion.  She had DIP PIP thickening without any synovitis.  She is good range of motion of bilateral hip joints knee joints ankles MTPs PIPs.  She has some discomfort in her left ankle due to prior injury.  CDAI Exam: CDAI Homunculus Exam:   Joint Counts:  CDAI Tender Joint count: 0 CDAI Swollen Joint count: 0  Global Assessments:  Patient Global Assessment: 3 Provider Global Assessment: 3  CDAI Calculated Score: 6    Investigation: No additional findings.TB Gold: 08/07/2016 Negative  CBC Latest Ref Rng & Units 06/12/2017 02/26/2017 11/14/2016  WBC 3.8 - 10.8 Thousand/uL 6.5 8.5 6.8  Hemoglobin 11.7 - 15.5 g/dL 14.3 14.7 14.7  Hematocrit 35.0 - 45.0 % 41.7 43.7 43.0  Platelets 140 - 400 Thousand/uL 199 195 194   CMP Latest Ref Rng & Units 06/12/2017 02/26/2017 11/14/2016  Glucose 65 - 99 mg/dL 88 89 78  BUN 7 - 25 mg/dL 12 13 9   Creatinine 0.50 - 0.99 mg/dL 0.85 0.77 0.81  Sodium 135 - 146 mmol/L 142 143 141  Potassium 3.5 - 5.3 mmol/L 3.9 4.3 4.0  Chloride 98 - 110 mmol/L 107 107 106  CO2 20 - 32 mmol/L 29 29 27   Calcium 8.6 - 10.4 mg/dL 9.1 9.5 9.3  Total Protein 6.1 - 8.1 g/dL 6.6 7.1 6.9  Total Bilirubin 0.2 - 1.2 mg/dL 0.6 0.5 0.7  Alkaline Phos 33 - 130 U/L - - -  AST 10 - 35 U/L 29 16 17   ALT 6 - 29 U/L 28 15 17     Imaging: No results found.  Speciality Comments: No specialty comments available.    Procedures:  No procedures performed Allergies: Patient has no known allergies.   Assessment / Plan:     Visit Diagnoses: Psoriatic arthropathy (HCC)-patient has no active  synovitis.  She does have some synovial thickening and also some osteoarthritis which causes discomfort.  Psoriasis-she has no active psoriasis.  High risk medication use - Enbrel q o wk, MTX 7 tabs po qwk, folic acid 2 mg po qd. her labs have been stable we will continue to monitor labs every 3 months.  TB Gold is due with her next labs.  Muscular deconditioning-she has been experiencing some weakness in her bilateral lower extremities.  She she feels her strength is not as good.  We will refer to physical therapy.  DDD (degenerative disc  disease), cervical-doing well.  History of restless legs syndrome  History of esophagitis    Orders: Orders Placed This Encounter  Procedures  . QuantiFERON-TB Gold Plus  . Ambulatory referral to Physical Therapy   Meds ordered this encounter  Medications  . etanercept (ENBREL SURECLICK) 50 MG/ML injection    Sig: Inject 0.98 mLs (50 mg total) into the skin every 14 (fourteen) days.    Dispense:  6 Syringe    Refill:  0    Face-to-face time spent with patient was 30 minutes.> 50% of time was spent in counseling and coordination of care.  Follow-Up Instructions: Return in about 5 months (around 12/30/2017) for Psoriatic arthritis.   Bo Merino, MD  Note - This record has been created using Editor, commissioning.  Chart creation errors have been sought, but may not always  have been located. Such creation errors do not reflect on  the standard of medical care.

## 2017-07-30 ENCOUNTER — Ambulatory Visit (INDEPENDENT_AMBULATORY_CARE_PROVIDER_SITE_OTHER): Payer: BLUE CROSS/BLUE SHIELD | Admitting: Rheumatology

## 2017-07-30 ENCOUNTER — Encounter: Payer: Self-pay | Admitting: Rheumatology

## 2017-07-30 VITALS — BP 119/89 | HR 87 | Ht 62.0 in | Wt 176.0 lb

## 2017-07-30 DIAGNOSIS — Z8669 Personal history of other diseases of the nervous system and sense organs: Secondary | ICD-10-CM | POA: Diagnosis not present

## 2017-07-30 DIAGNOSIS — L4050 Arthropathic psoriasis, unspecified: Secondary | ICD-10-CM

## 2017-07-30 DIAGNOSIS — R29898 Other symptoms and signs involving the musculoskeletal system: Secondary | ICD-10-CM | POA: Diagnosis not present

## 2017-07-30 DIAGNOSIS — Z8719 Personal history of other diseases of the digestive system: Secondary | ICD-10-CM

## 2017-07-30 DIAGNOSIS — M5030 Other cervical disc degeneration, unspecified cervical region: Secondary | ICD-10-CM

## 2017-07-30 DIAGNOSIS — L409 Psoriasis, unspecified: Secondary | ICD-10-CM

## 2017-07-30 DIAGNOSIS — Z79899 Other long term (current) drug therapy: Secondary | ICD-10-CM | POA: Diagnosis not present

## 2017-07-30 MED ORDER — ETANERCEPT 50 MG/ML ~~LOC~~ SOAJ: 50.0000 mg | SUBCUTANEOUS | 0 refills | Status: DC

## 2017-07-30 NOTE — Patient Instructions (Addendum)
Standing Labs We placed an order today for your standing lab work.    Please come back and get your standing labs in August and every 3 months TB Gold is due with next labs.  We have open lab Monday through Friday from 8:30-11:30 AM and 1:30-4:00 PM  at the office of Dr. Bo Merino.   You may experience shorter wait times on Monday and Friday afternoons. The office is located at [address] [place], [place], [place] [address] No appointment is necessary.   Labs are drawn by [facility].  You may receive a bill from [facility] for your lab work. If you have any questions regarding directions or hours of operation,  please call [phone].

## 2017-08-21 ENCOUNTER — Ambulatory Visit: Payer: BLUE CROSS/BLUE SHIELD | Attending: Rheumatology | Admitting: Physical Therapy

## 2017-08-21 ENCOUNTER — Other Ambulatory Visit: Payer: Self-pay

## 2017-08-21 ENCOUNTER — Encounter: Payer: Self-pay | Admitting: Physical Therapy

## 2017-08-21 DIAGNOSIS — M62831 Muscle spasm of calf: Secondary | ICD-10-CM | POA: Diagnosis present

## 2017-08-21 DIAGNOSIS — R29898 Other symptoms and signs involving the musculoskeletal system: Secondary | ICD-10-CM | POA: Insufficient documentation

## 2017-08-21 DIAGNOSIS — R262 Difficulty in walking, not elsewhere classified: Secondary | ICD-10-CM | POA: Insufficient documentation

## 2017-08-21 DIAGNOSIS — M6281 Muscle weakness (generalized): Secondary | ICD-10-CM

## 2017-08-21 NOTE — Therapy (Signed)
[facility] [address] [place], [place], [address] Phone: [phone]   Fax:  [phone]  Physical Therapy Evaluation  Patient Details  Name: Cynthia Snyder MRN: [id number] Date of Birth: 06-26-1955 Referring Provider: Bo Merino, MD   Encounter Date: 08/21/2017  PT End of Session - 08/21/17 1923    Visit Number  1    Number of Visits  7 eval plus 6 visits    Date for PT Re-Evaluation  09/21/17    Authorization Type  [employer] (benefit period April-Dec 2020); VL pt,ot, slp, chiro 30 (hard max) zero used    Authorization - Visit Number  1    Authorization - Number of Visits  30    PT Start Time  0932    PT Stop Time  1011    PT Time Calculation (min)  39 min    Activity Tolerance  Patient tolerated treatment well    Behavior During Therapy  [facility] for tasks assessed/performed       Past Medical History:  Diagnosis Date  . Abnormal Pap smear    many yrs ago  . Endometrial cancer ([place])   . Endometriosis   . Esophagus disorder   . Psoriatic arthritis ([place])   . Shingles   . Shingles     Past Surgical History:  Procedure Laterality Date  . ABDOMINAL HYSTERECTOMY     age 70  . APPENDECTOMY    . BILATERAL SALPINGOOPHORECTOMY Right 11/24/08   lysis of adhesion  . esophageal stretched    . EXPLORATORY LAPAROTOMY    . jaw bone graft  2004  . KNEE ARTHROPLASTY    . KNEE SURGERY    . LAPAROSCOPY     age 26  . LIPOSUCTION TRUNK  04/06/2016   Dr. Lenon Curt for body image  . LIVER BIOPSY     mild steatosis    There were no vitals filed for this visit.   Subjective Assessment - 08/21/17 0935    Subjective  I am experiencing tightness in the left leg. Sometimes when I stretch a knot comes up (different places on back of lower leg) and I have to work it out to be able walk on it. This happens 1-2 x/wk. Sometimes my knee buckles. Buckling only twice in a month. I feel better when I'm on my feet than when I lie  down. For several years, I've been told I have restless leg syndrome and the current problem (tightness, cramps, knots) started about a year ago and has been progressively happening more.     Pertinent History  psoriatic arthritis, cervical DJD, restless leg syndrome, endometrial cancer, rt TKR, Lt ankle surgery (per pt)    Diagnostic tests  02/27/17 doppler Legs negative for DVT or popliteal cyst    Patient Stated Goals  Want to be able to lay in bed and stretch my legs without cramps coming on; don't want to fall    Currently in Pain?  No/denies         [facility] PT Assessment - 08/21/17 0944      Assessment   Medical Diagnosis  Muscular deconditioning    Referring Provider  Bo Merino, MD    Onset Date/Surgical Date  -- approximately one year ago    Prior Therapy  s/p rt TKR      Precautions   Precautions  Fall rt knee has buckled twice      Restrictions   Weight Bearing Restrictions  No  Balance Screen   Has the patient fallen in the past 6 months  No    Has the patient had a decrease in activity level because of a fear of falling?   Yes a whole lot more cautious    Is the patient reluctant to leave their home because of a fear of falling?   No      Home Film/video editor residence    Living Arrangements  Spouse/significant other    Available Help at Discharge  Family    Type of [place] to enter    Entrance Stairs-Number of Steps  2    [place]  One level    [place] - single point;Walker - 2 wheels;Wheelchair - manual    Additional Comments  equipment is her husband's; neither uses now      Prior Function   Level of Independence  Independent    Vocation  Part time employment 30 hrs week    Vocation Requirements  office work; Solicitor homes    Leisure  6 grandkids (keeps them quite a bit); elderly parents (sees them 1-2x/week); enjoys Astronomer work ; a Museum/gallery curator Status  Within Functional Limits for tasks assessed      Observation/Other Assessments   Skin Integrity  2 small scabs front left shin (I scraped it helping on the job site)    Focus on Therapeutic Outcomes (FOTO)   NA      Observation/Other Assessments-Edema    Edema  -- none noted      Sensation   Light Touch  Appears Intact      Coordination   Gross Motor Movements are Fluid and Coordinated  Yes    Fine Motor Movements are Fluid and Coordinated  Yes rapid toe tapping      Posture/Postural Control   Posture/Postural Control  No significant limitations      Tone   Assessment Location  Right Lower Extremity;Left Lower Extremity      ROM / Strength   AROM / PROM / Strength  AROM;Strength      AROM   Overall AROM   Deficits    Overall AROM Comments  WNL except lt ankle DF 4 deg; PROM 5; Rt ankle AROM DF 7; PrOM 10 (tested in sitting with knee extended)      Strength   Overall Strength  Deficits    Strength Assessment Site  Hip;Knee;Ankle    Right/Left Hip  Right;Left    Right Hip Flexion  5/5    Left Hip Flexion  4+/5    Right/Left Knee  Right;Left    Right Knee Flexion  4/5    Right Knee Extension  5/5    Left Knee Flexion  3+/5    Left Knee Extension  4/5    Right/Left Ankle  Right;Left    Right Ankle Dorsiflexion  5/5    Left Ankle Dorsiflexion  5/5      Palpation   Palpation comment  pt prone to assess bil calves; + knot in left lateral gastrocnemius; noted tender area below left medial malleolus with pt reporting that has been present since surgery on left ankle (no redness or warmth noted)      Ambulation/Gait   Gait Pattern  Within Functional Limits    Ambulation  Surface  Level;Indoor      RLE Tone   RLE Tone  Within Functional Limits      LLE Tone   LLE Tone  Within Functional Limits                Objective measurements completed on examination: See above findings.               PT Education - 08/21/17 1213    Education Details  stretches she is doing are appropriate; discussed potential use of compression socks however she has tried these for her spider veins and could not tolerate the feeling of claustrophobia (tried full sock, open-toed, and calf only styles); rationale for dry needling and potential benefits    Person(s) Educated  Patient    Methods  Explanation    Comprehension  Verbalized understanding          PT Long Term Goals - 08/21/17 1951      PT LONG TERM GOAL #1   Title  Patient will be independent with HEP (Target for all LTGs 09/21/2017)    Time  4    Period  Weeks    Status  New    Target Date  09/21/17      PT LONG TERM GOAL #2   Title  Patient will report >=30% reduction in severity and frequency of left lower leg spasms.    Time  4    Period  Weeks    Status  New      PT LONG TERM GOAL #3   Title  Patient will improve bil ankle DF by 5 degrees (approaching normal ROM) demonstrating effectiveness of stretching program.     Time  4    Period  Weeks    Status  New             Plan - 08/21/17 1926    Clinical Impression Statement  Patient referred to OPPT for muscular deconditioning. Patient's primary concerns are tightness with spasms/knots in her left lower leg, buckling of left knee, and weakness in left more than right leg. Testing revealed mild weakness in left hip and left knee compared to right. Palpable knot in left gastrocnemius. Patient has already been using mild stretches and ROM exercises to address the tightness/spasms in her LLE, however these have become less effective. Spasms at times disrupt her ability to sleep. Anticipate patient can benefit from PT to establish a home exercise program to address LE weakness and potential use of dry needling to reduce spasms and "knots" in left calf. In addition to these deficits, patient can benefit from the PT interventions listed below to address the  deficits listed below.     History and Personal Factors relevant to plan of care:  PMH-psoriatic arthritis, cervical DJD, restless leg syndrome, endometrial cancer, rt TKR, Lt ankle surgery (per pt)    Clinical Presentation  Evolving    Clinical Presentation due to:  patient reports progressive worsening of her left leg symptoms    Clinical Decision Making  Low    Rehab Potential  Fair    Clinical Impairments Affecting Rehab Potential  unclear cause of LLE weakness and spasms    PT Frequency  Other (comment) 2x/week for 2 weeks, then 1x/week for 2 weeks    PT Duration  4 weeks    PT Treatment/Interventions  ADLs/Self Care Home Management;Biofeedback;Cryotherapy;Electrical Stimulation;Moist Heat;Therapeutic activities;Therapeutic exercise;Neuromuscular re-education;Manual techniques;Patient/family education;Passive range of motion;Dry needling;Taping    PT Next Visit Plan  assess if appropriate for dry needling; initiate HEP for LLE strengthening and calf stretches (soleus and gastroc)    Consulted and Agree with Plan of Care  Patient       Patient will benefit from skilled therapeutic intervention in order to improve the following deficits and impairments:  Decreased range of motion, Decreased strength, Increased fascial restricitons, Increased muscle spasms, Impaired flexibility  Visit Diagnosis: Difficulty in walking, not elsewhere classified - Plan: PT plan of care cert/re-cert  Muscle spasm of calf - Plan: PT plan of care cert/re-cert  Muscle weakness (generalized) - Plan: PT plan of care cert/re-cert  Other symptoms and signs involving the musculoskeletal system - Plan: PT plan of care cert/re-cert     Problem List Patient Active Problem List   Diagnosis Date Noted  . Psoriatic arthropathy ([place]) 11/07/2016  . Psoriasis 08/04/2016  . DJD (degenerative joint disease), cervical 08/04/2016  . Esophagitis 08/04/2016  . Restless leg syndrome 08/04/2016  . High risk medication use  08/04/2016  . Atrophic vaginitis 01/22/2015    Class: Chronic    Rexanne Mano, PT 08/21/2017, 8:02 PM  [facility] [address] [place], [place], [address] Phone: [phone]   Fax:  [phone]  Name: MONTRICE MONTUORI MRN: [id number] Date of Birth: 1955-12-15

## 2017-08-24 ENCOUNTER — Ambulatory Visit: Payer: BLUE CROSS/BLUE SHIELD | Admitting: Physical Therapy

## 2017-08-27 ENCOUNTER — Encounter: Payer: Self-pay | Admitting: Physical Therapy

## 2017-08-27 ENCOUNTER — Ambulatory Visit: Payer: BLUE CROSS/BLUE SHIELD | Admitting: Physical Therapy

## 2017-08-27 DIAGNOSIS — M6281 Muscle weakness (generalized): Secondary | ICD-10-CM

## 2017-08-27 DIAGNOSIS — R29898 Other symptoms and signs involving the musculoskeletal system: Secondary | ICD-10-CM

## 2017-08-27 DIAGNOSIS — M62831 Muscle spasm of calf: Secondary | ICD-10-CM

## 2017-08-27 DIAGNOSIS — R262 Difficulty in walking, not elsewhere classified: Secondary | ICD-10-CM

## 2017-08-27 NOTE — Therapy (Signed)
[facility] [address]  [place], [place], [address] Phone: [phone]   Fax:  [phone]  Physical Therapy Treatment  Patient Details  Name: Cynthia Snyder MRN: [id number] Date of Birth: 06/02/1955 Referring Provider: Bo Merino, MD   Encounter Date: 08/27/2017  PT End of Session - 08/27/17 1615    Visit Number  2    Number of Visits  7 eval plus 6 visits    Date for PT Re-Evaluation  09/21/17    Authorization Type  BCBS (benefit period April-Dec 2020); VL pt,ot, slp, chiro 30 (hard max) zero used    Authorization - Visit Number  2    Authorization - Number of Visits  30    PT Start Time  4010    PT Stop Time  1702    PT Time Calculation (min)  47 min    Activity Tolerance  Patient tolerated treatment well    Behavior During Therapy  [facility] for tasks assessed/performed       Past Medical History:  Diagnosis Date  . Abnormal Pap smear    many yrs ago  . Endometrial cancer ([place])   . Endometriosis   . Esophagus disorder   . Psoriatic arthritis ([place])   . Shingles   . Shingles     Past Surgical History:  Procedure Laterality Date  . ABDOMINAL HYSTERECTOMY     age 62  . APPENDECTOMY    . BILATERAL SALPINGOOPHORECTOMY Right 11/24/08   lysis of adhesion  . esophageal stretched    . EXPLORATORY LAPAROTOMY    . jaw bone graft  2004  . KNEE ARTHROPLASTY    . KNEE SURGERY    . LAPAROSCOPY     age 21  . LIPOSUCTION TRUNK  04/06/2016   Dr. Lenon Curt for body image  . LIVER BIOPSY     mild steatosis    There were no vitals filed for this visit.  Subjective Assessment - 08/27/17 1616    Subjective  Pt reporting more tightness/soreness in calves than pain today. Reports she "took a knot in my calf the night before last" and has had trouble getting her heel down on the floor since.    Pertinent History  psoriatic arthritis, cervical DJD, restless leg syndrome, endometrial cancer, rt TKR, Lt ankle  surgery (per pt)    Diagnostic tests  02/27/17 doppler Legs negative for DVT or popliteal cyst    Patient Stated Goals  Want to be able to lay in bed and stretch my legs without cramps coming on; don't want to fall    Currently in Pain?  No/denies                       [facility] Adult PT Treatment/Exercise - 08/27/17 1615      Exercises   Exercises  Ankle      Manual Therapy   Manual Therapy  Soft tissue mobilization;Myofascial release;Taping    Manual therapy comments  prone with ankle resting on pillow    Soft tissue mobilization  L gastroc/soleus    Myofascial Release  manual TPR lateral L gastroc & soleus    Kinesiotex  Inhibit Muscle      Kinesiotix   Inhibit Muscle   L gastroc/soleus - 30% stretch      Ankle Exercises: Stretches   Soleus Stretch  30 seconds;2 reps    Soleus Stretch Limitations  standing at wall    Press photographer  30 seconds;2 reps    Gastroc Stretch Limitations  standing at wall      Ankle Exercises: Aerobic   Recumbent Bike  L2 x 6 min      Ankle Exercises: Seated   Other Seated Ankle Exercises  L 4 way ankle with yellow TB x 10       Trigger Point Dry Needling - 08/27/17 1615    Consent Given?  Yes    Education Handout Provided  Yes    Muscles Treated Lower Body  Gastrocnemius;Soleus L    Gastrocnemius Response  Twitch response elicited;Palpable increased muscle length    Soleus Response  Twitch response elicited;Palpable increased muscle length           PT Education - 08/27/17 1700    Education Details  Dry needling - precautions, expected response and post-treatment recommendations; Kinesiotaping wearing instructions; Initial HEP    Person(s) Educated  Patient    Methods  Explanation;Demonstration;Handout    Comprehension  Verbalized understanding;Returned demonstration;Need further instruction          PT Long Term Goals - 08/27/17 1619      PT LONG TERM GOAL #1   Title  Patient will be independent with HEP (Target  for all LTGs 09/21/2017)    Status  On-going      PT LONG TERM GOAL #2   Title  Patient will report >=30% reduction in severity and frequency of left lower leg spasms.    Status  On-going      PT LONG TERM GOAL #3   Title  Patient will improve bil ankle DF by 5 degrees (approaching normal ROM) demonstrating effectiveness of stretching program.     Status  On-going            Plan - 08/27/17 1619    Clinical Impression Statement  [place] returning today noting episode of L calf cramp and spasm ~2 days ago, with lingering soreness and stiffness/tightness. Palpation of L calf revealed definite trigger points and taut bands in lateral gastroc & soleus amenable to DN. Pt was educated about DN including increased risk for bruising due the spider veins and the Enbrel, and with informed pt consent, DN was performed in conjunction with manual therapy to lateral gastroc & soleus with good twitch response and palpable reduction in tension and ttp following DN and no signs of bruising. Pt noted improvement in the tolerance for touch/deep pressure following DN and was able to complete gastroc & soleus stretches and basic ankle strengthening well, with initial HEP provided. Treatment concluded with application of kinesiotape to promote further muscle relaxation and hopefully give her some relief from the cramps/spasms.    Rehab Potential  Fair    Clinical Impairments Affecting Rehab Potential  unclear cause of LLE weakness and spasms    PT Frequency  Other (comment) 2x/week for 2 weeks, then 1x/week for 2 weeks    PT Duration  4 weeks    PT Treatment/Interventions  ADLs/Self Care Home Management;Biofeedback;Cryotherapy;Electrical Stimulation;Moist Heat;Therapeutic activities;Therapeutic exercise;Neuromuscular re-education;Manual techniques;Patient/family education;Passive range of motion;Dry needling;Taping    PT Next Visit Plan  assess if appropriate for dry needling; initiate HEP for LLE strengthening  and calf stretches (soleus and gastroc)    Consulted and Agree with Plan of Care  Patient       Patient will benefit from skilled therapeutic intervention in order to improve the following deficits and impairments:  Decreased range of motion, Decreased strength, Increased fascial restricitons, Increased muscle spasms, Impaired flexibility  Visit Diagnosis: Difficulty in walking, not elsewhere classified  Muscle spasm of calf  Muscle weakness (generalized)  Other symptoms and signs involving the musculoskeletal system     Problem List Patient Active Problem List   Diagnosis Date Noted  . Psoriatic arthropathy ([place]) 11/07/2016  . Psoriasis 08/04/2016  . DJD (degenerative joint disease), cervical 08/04/2016  . Esophagitis 08/04/2016  . Restless leg syndrome 08/04/2016  . High risk medication use 08/04/2016  . Atrophic vaginitis 01/22/2015    Class: Chronic    Percival Spanish, PT, MPT 08/27/2017, 6:17 PM  [facility] [address]  [place], [place], [address] Phone: [phone]   Fax:  [phone]  Name: GAYATRI TEASDALE MRN: [id number] Date of Birth: [birth year]

## 2017-08-27 NOTE — Patient Instructions (Addendum)

## 2017-08-30 ENCOUNTER — Encounter: Payer: Self-pay | Admitting: Physical Therapy

## 2017-08-30 ENCOUNTER — Ambulatory Visit: Payer: BLUE CROSS/BLUE SHIELD | Admitting: Physical Therapy

## 2017-08-30 DIAGNOSIS — M6281 Muscle weakness (generalized): Secondary | ICD-10-CM

## 2017-08-30 DIAGNOSIS — M62831 Muscle spasm of calf: Secondary | ICD-10-CM

## 2017-08-30 DIAGNOSIS — R262 Difficulty in walking, not elsewhere classified: Secondary | ICD-10-CM

## 2017-08-30 DIAGNOSIS — R29898 Other symptoms and signs involving the musculoskeletal system: Secondary | ICD-10-CM

## 2017-08-30 NOTE — Therapy (Signed)
[facility] [address]  [place], [place], [address] Phone: [phone]   Fax:  [phone]  Physical Therapy Treatment  Patient Details  Name: Cynthia Snyder MRN: [id number] Date of Birth: [birth year] Referring Provider: Bo Merino, MD   Encounter Date: 08/30/2017  PT End of Session - 08/30/17 1659    Visit Number  3    Number of Visits  7 eval plus 6 visits    Date for PT Re-Evaluation  09/21/17    Authorization Type  BCBS (benefit period April-Dec 2020); VL pt,ot, slp, chiro 30 (hard max) zero used    Authorization - Visit Number  3    Authorization - Number of Visits  30    PT Start Time  7371    PT Stop Time  1747    PT Time Calculation (min)  48 min    Activity Tolerance  Patient tolerated treatment well    Behavior During Therapy  [facility] for tasks assessed/performed       Past Medical History:  Diagnosis Date  . Abnormal Pap smear    many yrs ago  . Endometrial cancer ([place])   . Endometriosis   . Esophagus disorder   . Psoriatic arthritis ([place])   . Shingles   . Shingles     Past Surgical History:  Procedure Laterality Date  . ABDOMINAL HYSTERECTOMY     age 83  . APPENDECTOMY    . BILATERAL SALPINGOOPHORECTOMY Right 11/24/08   lysis of adhesion  . esophageal stretched    . EXPLORATORY LAPAROTOMY    . jaw bone graft  2004  . KNEE ARTHROPLASTY    . KNEE SURGERY    . LAPAROSCOPY     age 45  . LIPOSUCTION TRUNK  04/06/2016   Dr. Lenon Curt for body image  . LIVER BIOPSY     mild steatosis    There were no vitals filed for this visit.  Subjective Assessment - 08/30/17 1701    Subjective  Pt noting good relief from manual therapy and DN to L calf last session with minimal soreness and improvement in cramping - now noticing R LE more than L.    Pertinent History  psoriatic arthritis, cervical DJD, restless leg syndrome, endometrial cancer, rt TKR, Lt ankle surgery (per pt)    Diagnostic  tests  02/27/17 doppler Legs negative for DVT or popliteal cyst    Patient Stated Goals  Want to be able to lay in bed and stretch my legs without cramps coming on; don't want to fall    Currently in Pain?  No/denies                       [facility] Adult PT Treatment/Exercise - 08/30/17 1659      Exercises   Exercises  Knee/Hip;Ankle      Knee/Hip Exercises: Standing   Knee Flexion  Left;Right    Knee Flexion Limitations  deferred d/t HS cramp on initial attempt B    Functional Squat  10 reps;3 seconds    Functional Squat Limitations  counter squat - cues to avoid hip ER and knee past toes      Knee/Hip Exercises: Seated   Hamstring Curl  Right;Left;10 reps    Hamstring Limitations  yellow TB      Knee/Hip Exercises: Supine   Bridges  Both;10 reps    Other Supine Knee/Hip Exercises  Brace marching with red TB x10  Other Supine Knee/Hip Exercises  Abd bracing + alt hip ABD/ER with red TB x10      Manual Therapy   Manual Therapy  Taping    Kinesiotex  Inhibit Muscle      Kinesiotix   Inhibit Muscle   B gastroc/soleus - 30% stretch      Ankle Exercises: Aerobic   Recumbent Bike  L2 x 6 min      Ankle Exercises: Standing   Heel Raises  Both;10 reps;2 seconds 2 sets    Heel Raises Limitations  negative heel on back of UBE; 2nd set with alt LE eccentric lowering             PT Education - 08/30/17 1747    Education Details  HEP update/addition    Person(s) Educated  Patient    Methods  Explanation;Demonstration;Handout    Comprehension  Verbalized understanding;Returned demonstration          PT Long Term Goals - 08/27/17 1619      PT LONG TERM GOAL #1   Title  Patient will be independent with HEP (Target for all LTGs 09/21/2017)    Status  On-going      PT LONG TERM GOAL #2   Title  Patient will report >=30% reduction in severity and frequency of left lower leg spasms.    Status  On-going      PT LONG TERM GOAL #3   Title  Patient will  improve bil ankle DF by 5 degrees (approaching normal ROM) demonstrating effectiveness of stretching program.     Status  On-going            Plan - 08/30/17 1703    Clinical Impression Statement  Pt reporting benefit from manual therapy/DN and taping last session with decreased incidence of cramping/muscle spasms. Still notes some tightness during HEP stretches and 4 way ankle strengthening with yellow TB but feels these exercises are helping. Progressed ankle strengthening and intorduced more proximal LE strengthening with good tolerance other than HS cramp noted with attempt at standing HS curl - better with seated theraband resisted version. HEP upated to include strengthening progression and will continue to monitor for need for further manual therapy/DN as indicated.    Rehab Potential  Fair    Clinical Impairments Affecting Rehab Potential  unclear cause of LLE weakness and spasms    PT Frequency  Other (comment) 2x/week for 2 weeks, then 1x/week for 2 weeks    PT Duration  4 weeks    PT Treatment/Interventions  ADLs/Self Care Home Management;Biofeedback;Cryotherapy;Electrical Stimulation;Moist Heat;Therapeutic activities;Therapeutic exercise;Neuromuscular re-education;Manual techniques;Patient/family education;Passive range of motion;Dry needling;Taping    PT Next Visit Plan  assess if appropriate for dry needling; initiate HEP for LLE strengthening and calf stretches (soleus and gastroc)    Consulted and Agree with Plan of Care  Patient       Patient will benefit from skilled therapeutic intervention in order to improve the following deficits and impairments:  Decreased range of motion, Decreased strength, Increased fascial restricitons, Increased muscle spasms, Impaired flexibility  Visit Diagnosis: Difficulty in walking, not elsewhere classified  Muscle spasm of calf  Muscle weakness (generalized)  Other symptoms and signs involving the musculoskeletal  system     Problem List Patient Active Problem List   Diagnosis Date Noted  . Psoriatic arthropathy ([place]) 11/07/2016  . Psoriasis 08/04/2016  . DJD (degenerative joint disease), cervical 08/04/2016  . Esophagitis 08/04/2016  . Restless leg syndrome 08/04/2016  . High risk medication  use 08/04/2016  . Atrophic vaginitis 01/22/2015    Class: Chronic    Percival Spanish, PT, MPT 08/30/2017, 6:04 PM  [facility] [address]  [place], [place], [address] Phone: [phone]   Fax:  [phone]  Name: Cynthia Snyder MRN: [id number] Date of Birth: 07/14/1955

## 2017-09-03 ENCOUNTER — Ambulatory Visit: Payer: BLUE CROSS/BLUE SHIELD | Admitting: Physical Therapy

## 2017-09-06 ENCOUNTER — Ambulatory Visit: Payer: BLUE CROSS/BLUE SHIELD | Admitting: Physical Therapy

## 2017-09-06 DIAGNOSIS — M6281 Muscle weakness (generalized): Secondary | ICD-10-CM | POA: Insufficient documentation

## 2017-09-06 DIAGNOSIS — M62831 Muscle spasm of calf: Secondary | ICD-10-CM | POA: Insufficient documentation

## 2017-09-06 DIAGNOSIS — R262 Difficulty in walking, not elsewhere classified: Secondary | ICD-10-CM | POA: Insufficient documentation

## 2017-09-06 DIAGNOSIS — R29898 Other symptoms and signs involving the musculoskeletal system: Secondary | ICD-10-CM | POA: Insufficient documentation

## 2017-09-06 NOTE — Therapy (Signed)
[facility] [address]  [place], [place], [address] Phone: [phone]   Fax:  [phone]  Physical Therapy Treatment  Patient Details  Name: Cynthia Snyder MRN: [id number] Date of Birth: 05-Oct-1955 Referring Provider: Bo Merino, MD   Encounter Date: 09/06/2017  PT End of Session - 09/06/17 1616    Visit Number  -- Pt feeling like she can't stay for more than warm-up    Number of Visits  7 eval plus 6 visits    Date for PT Re-Evaluation  09/21/17    Authorization Type  [employer] (benefit period April-Dec 2020); VL pt,ot, slp, chiro 30 (hard max) zero used    Authorization - Visit Number  --    Authorization - Number of Visits  30    PT Start Time  1616    PT Stop Time  1620    PT Time Calculation (min)  4 min    Activity Tolerance  Patient tolerated treatment well    Behavior During Therapy  [facility] for tasks assessed/performed       Past Medical History:  Diagnosis Date  . Abnormal Pap smear    many yrs ago  . Endometrial cancer ([place])   . Endometriosis   . Esophagus disorder   . Psoriatic arthritis ([place])   . Shingles   . Shingles     Past Surgical History:  Procedure Laterality Date  . ABDOMINAL HYSTERECTOMY     age 32  . APPENDECTOMY    . BILATERAL SALPINGOOPHORECTOMY Right 11/24/08   lysis of adhesion  . esophageal stretched    . EXPLORATORY LAPAROTOMY    . jaw bone graft  2004  . KNEE ARTHROPLASTY    . KNEE SURGERY    . LAPAROSCOPY     age 58  . LIPOSUCTION TRUNK  04/06/2016   Dr. Lenon Curt for body image  . LIVER BIOPSY     mild steatosis    There were no vitals filed for this visit.  Subjective Assessment - 09/06/17 1616    Subjective  Pt arriving to PT very flustered, stating she doesn't have time for more than warm-up on bike and requesting updated HEP handout but not able to stay for training in new exercises. Does state cramping/calf pain has been better and denies need for  intervention for this.    Pertinent History  psoriatic arthritis, cervical DJD, restless leg syndrome, endometrial cancer, rt TKR, Lt ankle surgery (per pt)    Diagnostic tests  02/27/17 doppler Legs negative for DVT or popliteal cyst    Patient Stated Goals  Want to be able to lay in bed and stretch my legs without cramps coming on; don't want to fall    Currently in Pain?  No/denies                                    PT Long Term Goals - 08/27/17 1619      PT LONG TERM GOAL #1   Title  Patient will be independent with HEP (Target for all LTGs 09/21/2017)    Status  On-going      PT LONG TERM GOAL #2   Title  Patient will report >=30% reduction in severity and frequency of left lower leg spasms.    Status  On-going      PT LONG TERM GOAL #3   Title  Patient will  improve bil ankle DF by 5 degrees (approaching normal ROM) demonstrating effectiveness of stretching program.     Status  On-going            Plan - 09/06/17 1620    Clinical Impression Statement  Treatment deferred as pt feeling like she could only stay for a few minutes and only wanted PT to provide handout for updated HEP w/o instruction in new exercises. Also requesting to cancel scheduled appt for Mon 8/5.    Rehab Potential  Fair    Clinical Impairments Affecting Rehab Potential  unclear cause of LLE weakness and spasms    PT Frequency  Other (comment) 2x/week for 2 weeks, then 1x/week for 2 weeks    PT Duration  4 weeks    PT Treatment/Interventions  ADLs/Self Care Home Management;Biofeedback;Cryotherapy;Electrical Stimulation;Moist Heat;Therapeutic activities;Therapeutic exercise;Neuromuscular re-education;Manual techniques;Patient/family education;Passive range of motion;Dry needling;Taping    Consulted and Agree with Plan of Care  Patient       Patient will benefit from skilled therapeutic intervention in order to improve the following deficits and impairments:  Decreased range of  motion, Decreased strength, Increased fascial restricitons, Increased muscle spasms, Impaired flexibility  Visit Diagnosis: Difficulty in walking, not elsewhere classified  Muscle spasm of calf  Muscle weakness (generalized)  Other symptoms and signs involving the musculoskeletal system     Problem List Patient Active Problem List   Diagnosis Date Noted  . Psoriatic arthropathy ([place]) 11/07/2016  . Psoriasis 08/04/2016  . DJD (degenerative joint disease), cervical 08/04/2016  . Esophagitis 08/04/2016  . Restless leg syndrome 08/04/2016  . High risk medication use 08/04/2016  . Atrophic vaginitis 01/22/2015    Class: Chronic    Percival Spanish, PT, MPT 09/06/2017, 4:41 PM  [facility] [address]  [place], [place], [address] Phone: [phone]   Fax:  [phone]  Name: Cynthia Snyder MRN: [id number] Date of Birth: 26-Sep-1955

## 2017-09-10 ENCOUNTER — Ambulatory Visit: Payer: BLUE CROSS/BLUE SHIELD | Admitting: Physical Therapy

## 2017-09-18 ENCOUNTER — Ambulatory Visit: Payer: BLUE CROSS/BLUE SHIELD | Admitting: Physical Therapy

## 2017-09-20 ENCOUNTER — Encounter: Payer: BLUE CROSS/BLUE SHIELD | Admitting: Physical Therapy

## 2017-09-24 ENCOUNTER — Other Ambulatory Visit: Payer: Self-pay | Admitting: Rheumatology

## 2017-09-24 NOTE — Telephone Encounter (Signed)
Last Visit: 07/30/17 Next Visit: 12/25/17 Labs: 06/12/17 WNL TB Gold: 08/07/16 neg   Left message to advise patient she is due for labs.   Okay to refill 30 day supply per Dr. Estanislado Pandy

## 2017-09-26 ENCOUNTER — Ambulatory Visit: Payer: BLUE CROSS/BLUE SHIELD | Attending: Rheumatology | Admitting: Physical Therapy

## 2017-09-26 ENCOUNTER — Encounter: Payer: Self-pay | Admitting: Physical Therapy

## 2017-09-26 DIAGNOSIS — M62831 Muscle spasm of calf: Secondary | ICD-10-CM | POA: Diagnosis present

## 2017-09-26 DIAGNOSIS — R262 Difficulty in walking, not elsewhere classified: Secondary | ICD-10-CM | POA: Diagnosis not present

## 2017-09-26 DIAGNOSIS — M6281 Muscle weakness (generalized): Secondary | ICD-10-CM

## 2017-09-26 DIAGNOSIS — R29898 Other symptoms and signs involving the musculoskeletal system: Secondary | ICD-10-CM

## 2017-09-26 NOTE — Therapy (Signed)
[facility] [address]  [place], [place], [address] Phone: [phone]   Fax:  [phone]  Physical Therapy Treatment  Patient Details  Name: Cynthia Snyder MRN: [id number] Date of Birth: 1955/08/06 Referring Provider: Bo Merino, MD   Encounter Date: 09/26/2017  PT End of Session - 09/26/17 1020    Visit Number  4    Number of Visits  7   eval plus 6 visits   Date for PT Re-Evaluation  10/24/17    Authorization Type  BCBS (benefit period April-Dec 2020); VL pt,ot, slp, chiro 30 (hard max) zero used    Authorization - Visit Number  4    Authorization - Number of Visits  30    PT Start Time  1020    PT Stop Time  1103    PT Time Calculation (min)  43 min    Activity Tolerance  Patient tolerated treatment well    Behavior During Therapy  [facility] for tasks assessed/performed       Past Medical History:  Diagnosis Date  . Abnormal Pap smear    many yrs ago  . Endometrial cancer ([place])   . Endometriosis   . Esophagus disorder   . Psoriatic arthritis ([place])   . Shingles   . Shingles     Past Surgical History:  Procedure Laterality Date  . ABDOMINAL HYSTERECTOMY     age 74  . APPENDECTOMY    . BILATERAL SALPINGOOPHORECTOMY Right 11/24/08   lysis of adhesion  . esophageal stretched    . EXPLORATORY LAPAROTOMY    . jaw bone graft  2004  . KNEE ARTHROPLASTY    . KNEE SURGERY    . LAPAROSCOPY     age 55  . LIPOSUCTION TRUNK  04/06/2016   Dr. Lenon Curt for body image  . LIVER BIOPSY     mild steatosis    There were no vitals filed for this visit.  Subjective Assessment - 09/26/17 1023    Subjective  Pt reports leg has been doing pretty good but still noting some areas where things want to tighten up - different than before. Noted good relief of these type areas from prior DN - Reporting 90% improvement in prior muscle spasms.    Pertinent History  psoriatic arthritis, cervical DJD, restless leg  syndrome, endometrial cancer, rt TKR, Lt ankle surgery (per pt)    Diagnostic tests  02/27/17 doppler Legs negative for DVT or popliteal cyst    Patient Stated Goals  Want to be able to lay in bed and stretch my legs without cramps coming on; don't want to fall    Currently in Pain?  No/denies         [facility] PT Assessment - 09/26/17 1020      Assessment   Medical Diagnosis  Muscular deconditioning    Referring Provider  Bo Merino, MD      AROM   AROM Assessment Site  Ankle    Right/Left Ankle  Left    Left Ankle Dorsiflexion  6                   [facility] Adult PT Treatment/Exercise - 09/26/17 1020      Exercises   Exercises  Knee/Hip;Ankle      Knee/Hip Exercises: Stretches   Passive Hamstring Stretch  Left;30 seconds;3 reps    Passive Hamstring Stretch Limitations  + gastroc stretch with strap      Knee/Hip Exercises:  Supine   Other Supine Knee/Hip Exercises  Bridge + HS curl with heels on peanut ball x10 - pt noting return of cramping sensation folowing this      Manual Therapy   Manual Therapy  Soft tissue mobilization;Myofascial release;Taping    Manual therapy comments  prone with ankle resting on pillow    Soft tissue mobilization  L med/lat HS    Myofascial Release  manual TPR to lateral HS    Kinesiotex  Inhibit Muscle      Kinesiotix   Inhibit Muscle   L med/lat HS from insertion to origin - 30% stretch       Trigger Point Dry Needling - 09/26/17 1020    Consent Given?  Yes    Muscles Treated Lower Body  Hamstring    Hamstring Response  Twitch response elicited;Palpable increased muscle length   L lateral HS               PT Long Term Goals - 09/26/17 1026      PT LONG TERM GOAL #1   Title  Patient will be independent with HEP    Status  On-going      PT LONG TERM GOAL #2   Title  Patient will report >=30% reduction in severity and frequency of left lower leg spasms.    Status  Achieved      PT LONG TERM GOAL #3   Title   Patient will improve bil ankle DF by 5 degrees (approaching normal ROM) demonstrating effectiveness of stretching program.     Status  On-going            Plan - 09/26/17 1417    Clinical Impression Statement  Cynthia Snyder returning after nearly 4 week absence from PT due to dealing with issues with her elederly parents. She reports improvement in calf pain and cramping, now noting more cramping/tightness in proximal posterior L LE mostly in L lateral HS. Significant ttp present with taut bands identified - positive response to manual therapy and DN followed by HS/gastroc stretching but noted return of cramping with attempts at strengthening exercises. Performed additional DN and manual work to L lateral HS with resolution of cramping and kinesiotape applied to L med/lat HS to promote further muscle relaxation and reduce the risk of further cramping. Given ongoing issues with muscle pain and cramping as well as limited ankle DF ROM, will extend POC date for an additional month with 1x/wk frequency to allow for continued PT to address these issues and allow return to PLOF.    Rehab Potential  Fair    Clinical Impairments Affecting Rehab Potential  unclear cause of LLE weakness and spasms    PT Frequency  1x / week    PT Duration  4 weeks    PT Treatment/Interventions  ADLs/Self Care Home Management;Biofeedback;Cryotherapy;Electrical Stimulation;Moist Heat;Therapeutic activities;Therapeutic exercise;Neuromuscular re-education;Manual techniques;Patient/family education;Passive range of motion;Dry needling;Taping    Consulted and Agree with Plan of Care  Patient       Patient will benefit from skilled therapeutic intervention in order to improve the following deficits and impairments:  Decreased range of motion, Decreased strength, Increased fascial restricitons, Increased muscle spasms, Impaired flexibility  Visit Diagnosis: Difficulty in walking, not elsewhere classified  Muscle spasm of  calf  Muscle weakness (generalized)  Other symptoms and signs involving the musculoskeletal system     Problem List Patient Active Problem List   Diagnosis Date Noted  . Psoriatic arthropathy ([place]) 11/07/2016  . Psoriasis 08/04/2016  .  DJD (degenerative joint disease), cervical 08/04/2016  . Esophagitis 08/04/2016  . Restless leg syndrome 08/04/2016  . High risk medication use 08/04/2016  . Atrophic vaginitis 01/22/2015    Class: Chronic    Percival Spanish, PT, MPT 09/26/2017, 2:37 PM  [facility] [address]  [place], [place], [address] Phone: [phone]   Fax:  [phone]  Name: Cynthia Snyder MRN: [id number] Date of Birth: Mar 28, 1955

## 2017-09-28 ENCOUNTER — Other Ambulatory Visit: Payer: Self-pay | Admitting: Rheumatology

## 2017-09-28 NOTE — Telephone Encounter (Addendum)
Last Visit: 07/30/17 Next Visit: 12/25/17 Labs: 06/12/17 WNL  Patient advised that she is due for labs. Patient will come 10/01/17 to update lab.   Okay to refill 30 day supple per Dr. Estanislado Pandy

## 2017-10-01 ENCOUNTER — Other Ambulatory Visit: Payer: Self-pay

## 2017-10-01 DIAGNOSIS — Z79899 Other long term (current) drug therapy: Secondary | ICD-10-CM

## 2017-10-02 ENCOUNTER — Ambulatory Visit: Payer: BLUE CROSS/BLUE SHIELD | Admitting: Physical Therapy

## 2017-10-02 ENCOUNTER — Encounter: Payer: Self-pay | Admitting: Physical Therapy

## 2017-10-02 DIAGNOSIS — R262 Difficulty in walking, not elsewhere classified: Secondary | ICD-10-CM

## 2017-10-02 DIAGNOSIS — M62831 Muscle spasm of calf: Secondary | ICD-10-CM

## 2017-10-02 DIAGNOSIS — R29898 Other symptoms and signs involving the musculoskeletal system: Secondary | ICD-10-CM

## 2017-10-02 DIAGNOSIS — M6281 Muscle weakness (generalized): Secondary | ICD-10-CM

## 2017-10-02 NOTE — Therapy (Addendum)
[facility] [address]  [place], [place], [address] Phone: [phone]   Fax:  [phone]  Physical Therapy Treatment / Discharge Summary  Patient Details  Name: Cynthia Snyder MRN: [id number] Date of Birth: [birth year] Referring Provider: Bo Merino, MD   Encounter Date: 10/02/2017  PT End of Session - 10/02/17 1102    Visit Number  5    Number of Visits  7   eval plus 6 visits   Date for PT Re-Evaluation  10/24/17    Authorization Type  [employer]BS (benefit period April-Dec 2020); VL pt,ot, slp, chiro 30 (hard max) zero used    Authorization - Visit Number  5    Authorization - Number of Visits  30    PT Start Time  1102    PT Stop Time  1150    PT Time Calculation (min)  48 min    Activity Tolerance  Patient tolerated treatment well    Behavior During Therapy  [facility] for tasks assessed/performed       Past Medical History:  Diagnosis Date  . Abnormal Pap smear    many yrs ago  . Endometrial cancer ([place])   . Endometriosis   . Esophagus disorder   . Psoriatic arthritis ([place])   . Shingles   . Shingles     Past Surgical History:  Procedure Laterality Date  . ABDOMINAL HYSTERECTOMY     age 62  . APPENDECTOMY    . BILATERAL SALPINGOOPHORECTOMY Right 11/24/08   lysis of adhesion  . esophageal stretched    . EXPLORATORY LAPAROTOMY    . jaw bone graft  2004  . KNEE ARTHROPLASTY    . KNEE SURGERY    . LAPAROSCOPY     age 62  . LIPOSUCTION TRUNK  04/06/2016   Dr. Lenon Curt for body image  . LIVER BIOPSY     mild steatosis    There were no vitals filed for this visit.  Subjective Assessment - 10/02/17 1104    Subjective  Pt not noticing any pain for the past few days. States she notes "a little tightening up" in L HS but not as bad as it used to be.    Pertinent History  psoriatic arthritis, cervical DJD, restless leg syndrome, endometrial cancer, rt TKR, Lt ankle surgery (per pt)    Diagnostic  tests  02/27/17 doppler Legs negative for DVT or popliteal cyst    Patient Stated Goals  Want to be able to lay in bed and stretch my legs without cramps coming on; don't want to fall    Currently in Pain?  No/denies                       [facility] Adult PT Treatment/Exercise - 10/02/17 1102      Exercises   Exercises  Ankle;Knee/Hip      Knee/Hip Exercises: Standing   Knee Flexion  Left;Right;10 reps;Strengthening    Knee Flexion Limitations  yellow TB    Functional Squat  15 reps;3 seconds    Functional Squat Limitations  counter squat + triple extension    SLS with Vectors  L SLS with slight knee flexion + fwd reach to [place] while extending R LE back x5 - limited balance    Other Standing Knee Exercises  L Fitter hip extension (1 black, 1 blue) x10    Other Standing Knee Exercises  B sidestepping with yellow TB at midfoot &  Fwd/back monster walk with yellow TB 4 x 77f near counter for balance PRN      Knee/Hip Exercises: Seated   Other Seated Knee/Hip Exercises  L FItter leg press (1 black, 1 blue) x15      Manual Therapy   Manual Therapy  Soft tissue mobilization    Soft tissue mobilization  STM & IASTM with roller stick to L HS (primarily lateral HS)      Ankle Exercises: Aerobic   Recumbent Bike  L2 x 6 min      Ankle Exercises: Seated   Other Seated Ankle Exercises  L 4 way ankle with red TB x 10             PT Education - 10/02/17 1145    Education Details  HEP update/addition - ankle exercises progressed to red TB; added HS curls, side stepping and monster walk with yellow TB    Person(s) Educated  Patient    Methods  Explanation;Demonstration;Handout    Comprehension  Verbalized understanding;Returned demonstration          PT Long Term Goals - 09/26/17 1026      PT LONG TERM GOAL #1   Title  Patient will be independent with HEP    Status  On-going      PT LONG TERM GOAL #2   Title  Patient will report >=30% reduction in severity  and frequency of left lower leg spasms.    Status  Achieved      PT LONG TERM GOAL #3   Title  Patient will improve bil ankle DF by 5 degrees (approaching normal ROM) demonstrating effectiveness of stretching program.     Status  On-going            Plan - 10/02/17 1106    Clinical Impression Statement  CKristinareporting no recent pain and less overall tightness recently with only occasional "tightness" noted in L hamstrings but calves w/o recent cramping or tightness. Able to tolerance exercise progression well with only limitations noted in SLS balance during eccentric HS exercises. Discussed remaining visits with pt, asking her to come to next visit with any ongoing issues or concers with muscle tightness and/or HEP to determine if we will proceed with preparation for transition to HEP vs need for recert.    Rehab Potential  Fair    Clinical Impairments Affecting Rehab Potential  unclear cause of LLE weakness and spasms    PT Frequency  1x / week    PT Duration  4 weeks    PT Treatment/Interventions  ADLs/Self Care Home Management;Biofeedback;Cryotherapy;Electrical Stimulation;Moist Heat;Therapeutic activities;Therapeutic exercise;Neuromuscular re-education;Manual techniques;Patient/family education;Passive range of motion;Dry needling;Taping    Consulted and Agree with Plan of Care  Patient       Patient will benefit from skilled therapeutic intervention in order to improve the following deficits and impairments:  Decreased range of motion, Decreased strength, Increased fascial restricitons, Increased muscle spasms, Impaired flexibility  Visit Diagnosis: Difficulty in walking, not elsewhere classified  Muscle spasm of calf  Muscle weakness (generalized)  Other symptoms and signs involving the musculoskeletal system     Problem List Patient Active Problem List   Diagnosis Date Noted  . [place]iatic [place] ([place] 11/07/2016  . Psoriasis 08/04/2016  . DJD (degenerative  joint disease), cervical 08/04/2016  . Esophagitis 08/04/2016  . Restless leg syndrome 08/04/2016  . High risk medication use 08/04/2016  . Atrophic vaginitis 01/22/2015    Class: Chronic    JPercival Spanish PT,  MPT 10/02/2017, 11:59 AM  [facility] [address]  [place], [place], [address] Phone: [phone]   Fax:  [phone]  Name: Cynthia Snyder MRN: [id number] Date of Birth: 08-29-1955  PHYSICAL THERAPY DISCHARGE SUMMARY  Visits from Start of Care: 5  Current functional level related to goals / functional outcomes:   Refer to above clinical impression for status as of last visit on 10/02/17. Pt was unable to return in >30 days due to having to care for ailing mother, therefore unable to formally assess status at discharge but left message with front office that she was "feeling much better". Will proceed with discharge from PT for this episode.   Remaining deficits:   As above.   Education / Equipment:   HEP  Plan: Patient agrees to discharge.  Patient goals were partially met. Patient is being discharged due to not returning since the last visit.  ?????     Percival Spanish, PT, MPT 01/08/18, 2:37 PM  [facility] [address]  [place], [place], [address] Phone: [phone]   Fax:  [phone]

## 2017-10-03 LAB — COMPLETE METABOLIC PANEL WITH GFR
AG RATIO: 1.5 (calc) (ref 1.0–2.5)
ALKALINE PHOSPHATASE (APISO): 73 U/L (ref 33–130)
ALT: 15 U/L (ref 6–29)
AST: 19 U/L (ref 10–35)
Albumin: 4.3 g/dL (ref 3.6–5.1)
BUN: 15 mg/dL (ref 7–25)
CO2: 29 mmol/L (ref 20–32)
Calcium: 9.5 mg/dL (ref 8.6–10.4)
Chloride: 106 mmol/L (ref 98–110)
Creat: 0.93 mg/dL (ref 0.50–0.99)
GFR, Est African American: 76 mL/min/{1.73_m2} (ref >=60)
GFR, Est Non African American: 66 mL/min/{1.73_m2} (ref >=60)
GLUCOSE: 83 mg/dL (ref 65–99)
Globulin: 2.8 g/dL (calc) (ref 1.9–3.7)
POTASSIUM: 4.2 mmol/L (ref 3.5–5.3)
Sodium: 143 mmol/L (ref 135–146)
Total Bilirubin: 0.7 mg/dL (ref 0.2–1.2)
Total Protein: 7.1 g/dL (ref 6.1–8.1)

## 2017-10-03 LAB — QUANTIFERON-TB GOLD PLUS
Mitogen-NIL: >10 IU/mL
NIL: 0.03 IU/mL
QUANTIFERON-TB GOLD PLUS: NEGATIVE
TB1-NIL: 0.01 [IU]/mL
TB2-NIL: 0.01 IU/mL

## 2017-10-03 LAB — CBC WITH DIFFERENTIAL/PLATELET
BASOS ABS: 59 {cells}/uL (ref 0–200)
Basophils Relative: 1 %
EOS ABS: 130 {cells}/uL (ref 15–500)
Eosinophils Relative: 2.2 %
HEMATOCRIT: 42.5 % (ref 35.0–45.0)
HEMOGLOBIN: 14.2 g/dL (ref 11.7–15.5)
Lymphs Abs: 1888 cells/uL (ref 850–3900)
MCH: 30.7 pg (ref 27.0–33.0)
MCHC: 33.4 g/dL (ref 32.0–36.0)
MCV: 92 fL (ref 80.0–100.0)
MONOS PCT: 9 %
MPV: 11.5 fL (ref 7.5–12.5)
NEUTROS ABS: 3292 {cells}/uL (ref 1500–7800)
NEUTROS PCT: 55.8 %
Platelets: 223 10*3/uL (ref 140–400)
RBC: 4.62 10*6/uL (ref 3.80–5.10)
RDW: 14 % (ref 11.0–15.0)
Total Lymphocyte: 32 %
WBC mixed population: 531 cells/uL (ref 200–950)
WBC: 5.9 10*3/uL (ref 3.8–10.8)

## 2017-10-17 ENCOUNTER — Ambulatory Visit: Payer: BLUE CROSS/BLUE SHIELD | Admitting: Physical Therapy

## 2017-10-26 ENCOUNTER — Other Ambulatory Visit: Payer: Self-pay | Admitting: Rheumatology

## 2017-10-26 NOTE — Telephone Encounter (Signed)
Last Visit: 07/30/17 Next Visit: 12/25/17 Labs: 10/01/17 WNL  Okay to refill per Dr. Estanislado Pandy

## 2017-11-19 ENCOUNTER — Other Ambulatory Visit: Payer: Self-pay | Admitting: Rheumatology

## 2017-11-19 NOTE — Telephone Encounter (Signed)
Last Visit: 07/30/17 Next Visit: 12/25/17 Labs: 10/01/17 WNL TB Gold: 10/01/17 Neg   Okay to refill per Dr. Estanislado Pandy

## 2017-12-14 NOTE — Progress Notes (Signed)
Office Visit Note  Patient: Cynthia Snyder             Date of Birth: Aug 25, 1955           MRN: [id number]             PCP: Chesley Noon, MD Referring: Chesley Noon, MD Visit Date: 12/25/2017 Occupation: @GUAROCC @  Subjective:  Myalgias   History of Present Illness: Cynthia Snyder is a 62 y.o. female female with history of psoriatic arthritis.  She is on MTX 4 tablets by mouth on Saturdays and 3 tablets by mouth on Sunday and folic acid 242 mcg by mouth daily.  She has been injecting Enbrel every other week.  She denies any recent psoriatic arthritis flares.  She denies any joint pain or joint swelling at this time.  She denies any active psoriasis.  She denies any dizziness or plantar fasciitis.  She states she continues to have right SI joint pain.  She states that she occasionally will go see a chiropractor for alignment.  She reports that she went to physical therapy for muscle deconditioning of the lower extremities.  She reports that she had dry needling about 1 month ago which she feels has increased the myalgias in the right lower extremity.  She is superficial tenderness on the lateral aspect of the right lower extremity.  She denies any burning or numbness.  She denies any muscle weakness.  She describes the pain as a deep ache.  She says she continues to have chronic neck stiffness but denies any symptoms of radiculopathy at this time.   Activities of Daily Living:  Patient reports morning stiffness for 30-45 minutes.   Patient Denies nocturnal pain.  Difficulty dressing/grooming: Denies Difficulty climbing stairs: Reports Difficulty getting out of chair: Denies Difficulty using hands for taps, buttons, cutlery, and/or writing: Denies  Review of Systems  Constitutional: Negative for fatigue.  HENT: Positive for mouth dryness. Negative for mouth sores, trouble swallowing, trouble swallowing and nose dryness.   Eyes: Negative for pain, redness, itching, visual  disturbance and dryness.  Respiratory: Negative for cough, hemoptysis, shortness of breath, wheezing and difficulty breathing.   Cardiovascular: Negative for palpitations, hypertension and swelling in legs/feet.  Gastrointestinal: Negative for abdominal pain, blood in stool, constipation, diarrhea, nausea and vomiting.  Endocrine: Negative for increased urination.  Genitourinary: Negative for painful urination, nocturia and pelvic pain.  Musculoskeletal: Positive for arthralgias, joint pain, morning stiffness and muscle tenderness. Negative for joint swelling, myalgias, muscle weakness and myalgias.  Skin: Negative for color change, pallor, rash, hair loss, nodules/bumps, redness, skin tightness, ulcers and sensitivity to sunlight.  Allergic/Immunologic: Negative for susceptible to infections.  Neurological: Negative for dizziness, light-headedness, numbness, memory loss and weakness.  Hematological: Negative for bruising/bleeding tendency and swollen glands.  Psychiatric/Behavioral: Positive for sleep disturbance. Negative for depressed mood and confusion. The patient is not nervous/anxious.     PMFS History:  Patient Active Problem List   Diagnosis Date Noted  . Psoriatic arthropathy ([place]) 11/07/2016  . Psoriasis 08/04/2016  . DJD (degenerative joint disease), cervical 08/04/2016  . Esophagitis 08/04/2016  . Restless leg syndrome 08/04/2016  . High risk medication use 08/04/2016  . Atrophic vaginitis 01/22/2015    Class: Chronic    Past Medical History:  Diagnosis Date  . Abnormal Pap smear    many yrs ago  . Endometrial cancer ([place])   . Endometriosis   . Esophagus disorder   . Psoriatic arthritis ([place])   .  Shingles   . Shingles     Family History  Problem Relation Age of Onset  . Heart disease Mother   . Hypertension Mother   . Stroke Mother   . Diabetes Mother   . Osteoporosis Mother   . Heart disease Father   . Heart disease Brother   . Cancer Paternal Aunt         ovarian  . Colon cancer Maternal Grandmother    Past Surgical History:  Procedure Laterality Date  . ABDOMINAL HYSTERECTOMY     age 5  . APPENDECTOMY    . BILATERAL SALPINGOOPHORECTOMY Right 11/24/08   lysis of adhesion  . esophageal stretched    . EXPLORATORY LAPAROTOMY    . jaw bone graft  2004  . KNEE ARTHROPLASTY    . KNEE SURGERY    . LAPAROSCOPY     age 62  . LIPOSUCTION TRUNK  04/06/2016   Dr. Lenon Curt for body image  . LIVER BIOPSY     mild steatosis   Social History   Social History Narrative  . Not on file   Immunization History  Administered Date(s) Administered  . Influenza, Seasonal, Injecte, Preservative Fre 10/29/2013, 12/08/2014, 11/18/2015  . Tdap 11/06/2012  . Zoster 10/01/2015   Objective: Vital Signs: BP 130/88 (BP Location: Left Arm, Patient Position: Sitting, Cuff Size: Normal)   Pulse 86   Resp 13   Ht 5\' 2"  (1.575 m)   Wt 175 lb 12.8 oz (79.7 kg)   LMP 02/06/1982   BMI 32.15 kg/m    Physical Exam  Constitutional: She is oriented to person, place, and time. She appears well-developed and well-nourished.  HENT:  Head: Normocephalic and atraumatic.  Eyes: Conjunctivae and EOM are normal.  Neck: Normal range of motion.  Cardiovascular: Normal rate, regular rhythm, normal heart sounds and intact distal pulses.  Pulmonary/Chest: Effort normal and breath sounds normal.  Abdominal: Soft. Bowel sounds are normal.  Lymphadenopathy:    She has no cervical adenopathy.  Neurological: She is alert and oriented to person, place, and time.  Skin: Skin is warm and dry. Capillary refill takes less than 2 seconds.  Psychiatric: She has a normal mood and affect. Her behavior is normal.  Nursing note and vitals reviewed.    Musculoskeletal Exam: C-spine, thoracic spine, and lumbar spine good ROM. No midline spinal tenderness. Right SI joint tenderness.  Shoulder joints, elbow joints, wrist joints, MCPs, PIPs, and DIPs good ROM with no synovitis.  PIP and  DIP synovial thickening.  Hip joints, knee joints, ankle joints, MTPs, PIPs, and DIPs good ROM with no synovitis.  No warmth or effusion of knee joints.  No tenderness or swelling of ankle joints.  No achilles tendonitis or plantar fasciitis.  Superficial tenderness on the lateral aspect of the right   CDAI Exam: CDAI Score: Not documented Patient Global Assessment: Not documented; Provider Global Assessment: Not documented Swollen: Not documented; Tender: Not documented Joint Exam   Not documented   There is currently no information documented on the homunculus. Go to the Rheumatology activity and complete the homunculus joint exam.  Investigation: No additional findings.  Imaging: No results found.  Recent Labs: Lab Results  Component Value Date   WBC 5.9 10/01/2017   HGB 14.2 10/01/2017   PLT 223 10/01/2017   NA 143 10/01/2017   K 4.2 10/01/2017   CL 106 10/01/2017   CO2 29 10/01/2017   GLUCOSE 83 10/01/2017   BUN 15 10/01/2017   CREATININE 0.93  10/01/2017   BILITOT 0.7 10/01/2017   ALKPHOS 82 08/07/2016   AST 19 10/01/2017   ALT 15 10/01/2017   PROT 7.1 10/01/2017   ALBUMIN 4.4 08/07/2016   CALCIUM 9.5 10/01/2017   GFRAA 76 10/01/2017   QFTBGOLDPLUS NEGATIVE 10/01/2017    Speciality Comments: No specialty comments available.  Procedures:  No procedures performed Allergies: Patient has no known allergies.   Assessment / Plan:     Visit Diagnoses: Psoriatic arthropathy ([place]): She has no synovitis or dactylitis on exam.  She has not had any recent flares of psoriatic arthritis.  She has no Achilles tendinitis or plantar fasciitis.  She has mild right SI joint tenderness on exam.  Her psoriatic arthritis is well controlled on Enbrel 50 mg subcutaneous injections every 14 days and methotrexate 4 tablets by mouth on Saturdays and 3 tablets by mouth on Sundays.  She reports that she has been noticing increased joint stiffness and would like to increase the frequency of  Enbrel.  We discussed injecting Enbrel 50 mg subcutaneously every 10 days.  She will continue on methotrexate 4 tablets by mouth on Saturdays and 3 tablets by mouth on Sundays.  A refill of methotrexate will be sent to the pharmacy today.  She is advised to notify us if she develops increased joint pain or joint swelling.  She will follow-up in the office in 5 months.  Psoriasis: She has no active psoriasis or nail pitting at this time.  High risk medication use - Enbrel q o wk, MTX 7 tabs po qwk, folic acid 2 mg po qd. CBC and CMP will be drawn today to monitor for drug toxicity.  She will return in February no reason was for lab work.- Plan: CBC with Differential/Platelet, COMPLETE METABOLIC PANEL WITH GFR  Muscular deconditioning: She recently went to physical therapy.  She was on muscle strengthening and had dry needling performed.  Her last dry needling was 1 month to 6 weeks ago.  She has residual tenderness on the lateral aspect of the right lower extremity.  DDD (degenerative disc disease), cervical: She has good range of motion with no discomfort.  She is no symptoms of radiculopathy at this time.  She notices chronic neck stiffness.   Other medical conditions are listed as follows:  History of esophagitis  History of restless legs syndrome   Orders: Orders Placed This Encounter  Procedures  . CBC with Differential/Platelet  . COMPLETE METABOLIC PANEL WITH GFR   Meds ordered this encounter  Medications  . methotrexate (RHEUMATREX) 2.5 MG tablet    Sig: Take 7 tablets (17.5 mg total) by mouth once a week. Caution:Chemotherapy. Protect from light.    Dispense:  84 tablet    Refill:  0      Follow-Up Instructions: Return in about 5 months (around 05/26/2018) for Psoriatic arthritis, DDD.   Ofilia Neas, PA-C   I examined and evaluated the patient with Hazel Sams PA.  Patient had no synovitis on examination today.  She had some SI joint tenderness.  She is doing quite well  on combination of methotrexate and Enbrel.  She believes is spacing the Enbrel is causing some SI joint discomfort.  She will go back to Enbrel every 10 days instead of every 14 days.  We will continue to monitor her labs.  The plan of care was discussed as noted above.  Bo Merino, MD  Note - This record has been created using Editor, commissioning.  Chart creation errors have  been sought, but may not always  have been located. Such creation errors do not reflect on  the standard of medical care.

## 2017-12-25 ENCOUNTER — Ambulatory Visit (INDEPENDENT_AMBULATORY_CARE_PROVIDER_SITE_OTHER): Payer: BLUE CROSS/BLUE SHIELD | Admitting: Rheumatology

## 2017-12-25 ENCOUNTER — Encounter: Payer: Self-pay | Admitting: Rheumatology

## 2017-12-25 ENCOUNTER — Encounter (INDEPENDENT_AMBULATORY_CARE_PROVIDER_SITE_OTHER): Payer: Self-pay

## 2017-12-25 VITALS — BP 130/88 | HR 86 | Resp 13 | Ht 62.0 in | Wt 175.8 lb

## 2017-12-25 DIAGNOSIS — L409 Psoriasis, unspecified: Secondary | ICD-10-CM

## 2017-12-25 DIAGNOSIS — Z79899 Other long term (current) drug therapy: Secondary | ICD-10-CM | POA: Diagnosis not present

## 2017-12-25 DIAGNOSIS — L4050 Arthropathic psoriasis, unspecified: Secondary | ICD-10-CM | POA: Diagnosis not present

## 2017-12-25 DIAGNOSIS — Z8669 Personal history of other diseases of the nervous system and sense organs: Secondary | ICD-10-CM

## 2017-12-25 DIAGNOSIS — R29898 Other symptoms and signs involving the musculoskeletal system: Secondary | ICD-10-CM

## 2017-12-25 DIAGNOSIS — Z8719 Personal history of other diseases of the digestive system: Secondary | ICD-10-CM

## 2017-12-25 DIAGNOSIS — M5030 Other cervical disc degeneration, unspecified cervical region: Secondary | ICD-10-CM

## 2017-12-25 MED ORDER — METHOTREXATE 2.5 MG PO TABS: 17.5000 mg | ORAL_TABLET | ORAL | 0 refills | Status: DC

## 2017-12-25 NOTE — Patient Instructions (Signed)
Standing Labs We placed an order today for your standing lab work.    Please come back and get your standing labs in February and every 3 months  We have open lab Monday through Friday from 8:30-11:30 AM and 1:30-4:00 PM  at the office of Dr. Shaili Deveshwar.   You may experience shorter wait times on Monday and Friday afternoons. The office is located at [address], [address], [place], [place] [address] No appointment is necessary.   Labs are drawn by [facility].  You may receive a bill from [facility] for your lab work. If you have any questions regarding directions or hours of operation,  please call [phone].   Just as a reminder please drink plenty of water prior to coming for your lab work. Thanks!  

## 2017-12-26 LAB — COMPLETE METABOLIC PANEL WITH GFR
AG Ratio: 1.7 (calc) (ref 1.0–2.5)
ALBUMIN MSPROF: 4.4 g/dL (ref 3.6–5.1)
ALT: 20 U/L (ref 6–29)
AST: 17 U/L (ref 10–35)
Alkaline phosphatase (APISO): 71 U/L (ref 33–130)
BUN: 14 mg/dL (ref 7–25)
CALCIUM: 9.3 mg/dL (ref 8.6–10.4)
CO2: 28 mmol/L (ref 20–32)
CREATININE: 0.84 mg/dL (ref 0.50–0.99)
Chloride: 105 mmol/L (ref 98–110)
GFR, Est African American: 86 mL/min/{1.73_m2} (ref >=60)
GFR, Est Non African American: 74 mL/min/{1.73_m2} (ref >=60)
GLUCOSE: 86 mg/dL (ref 65–99)
Globulin: 2.6 g/dL (calc) (ref 1.9–3.7)
Potassium: 4 mmol/L (ref 3.5–5.3)
Sodium: 140 mmol/L (ref 135–146)
Total Bilirubin: 0.6 mg/dL (ref 0.2–1.2)
Total Protein: 7 g/dL (ref 6.1–8.1)

## 2017-12-26 LAB — CBC WITH DIFFERENTIAL/PLATELET
BASOS PCT: 0.7 %
Basophils Absolute: 59 cells/uL (ref 0–200)
EOS ABS: 126 {cells}/uL (ref 15–500)
Eosinophils Relative: 1.5 %
HCT: 43.2 % (ref 35.0–45.0)
HEMOGLOBIN: 14.4 g/dL (ref 11.7–15.5)
Lymphs Abs: 2671 cells/uL (ref 850–3900)
MCH: 30.5 pg (ref 27.0–33.0)
MCHC: 33.3 g/dL (ref 32.0–36.0)
MCV: 91.5 fL (ref 80.0–100.0)
MONOS PCT: 9.1 %
MPV: 10.7 fL (ref 7.5–12.5)
NEUTROS ABS: 4780 {cells}/uL (ref 1500–7800)
Neutrophils Relative %: 56.9 %
Platelets: 219 10*3/uL (ref 140–400)
RBC: 4.72 10*6/uL (ref 3.80–5.10)
RDW: 16.4 % — ABNORMAL HIGH (ref 11.0–15.0)
Total Lymphocyte: 31.8 %
WBC mixed population: 764 cells/uL (ref 200–950)
WBC: 8.4 10*3/uL (ref 3.8–10.8)

## 2017-12-26 NOTE — Progress Notes (Signed)
RDW mildly elevated. All other labs are WNL.

## 2018-01-01 ENCOUNTER — Encounter: Payer: Self-pay | Admitting: *Deleted

## 2018-03-06 ENCOUNTER — Other Ambulatory Visit: Payer: Self-pay

## 2018-03-06 ENCOUNTER — Encounter: Payer: Self-pay | Admitting: Certified Nurse Midwife

## 2018-03-06 ENCOUNTER — Other Ambulatory Visit (HOSPITAL_COMMUNITY)
Admission: RE | Admit: 2018-03-06 | Discharge: 2018-03-06 | Disposition: A | Discharge status: Home / Self Care | Payer: BLUE CROSS/BLUE SHIELD | Source: Ambulatory Visit | Attending: Certified Nurse Midwife | Admitting: Certified Nurse Midwife

## 2018-03-06 ENCOUNTER — Ambulatory Visit (INDEPENDENT_AMBULATORY_CARE_PROVIDER_SITE_OTHER): Payer: BLUE CROSS/BLUE SHIELD | Admitting: Certified Nurse Midwife

## 2018-03-06 VITALS — BP 114/70 | HR 68 | Resp 16 | Ht 61.5 in | Wt 175.0 lb

## 2018-03-06 DIAGNOSIS — Z8619 Personal history of other infectious and parasitic diseases: Secondary | ICD-10-CM

## 2018-03-06 DIAGNOSIS — Z1211 Encounter for screening for malignant neoplasm of colon: Secondary | ICD-10-CM | POA: Diagnosis not present

## 2018-03-06 DIAGNOSIS — Z124 Encounter for screening for malignant neoplasm of cervix: Secondary | ICD-10-CM

## 2018-03-06 DIAGNOSIS — Z01419 Encounter for gynecological examination (general) (routine) without abnormal findings: Secondary | ICD-10-CM

## 2018-03-06 MED ORDER — VALACYCLOVIR HCL 1 G PO TABS: ORAL_TABLET | ORAL | 1 refills | Status: DC

## 2018-03-06 NOTE — Patient Instructions (Signed)

## 2018-03-06 NOTE — Progress Notes (Signed)
63 y.o. G45P2002 Married  Caucasian Fe here for annual exam. Menopausal no HRT. Denies vaginal bleeding or vaginal dryness. Has new Rheumatologist and trying to adjust to change in activity with Psoriatic arthritis.  Sees Dr. Melford Aase PCP for aex,labs. No other health issues today.  Patient's last menstrual period was 02/06/1982.          Sexually active: Yes.    The current method of family planning is status post hysterectomy.    Exercising: No.  exercise Smoker:  no  Review of Systems  Constitutional: Negative.   HENT: Negative.   Eyes: Negative.   Respiratory: Negative.   Cardiovascular: Negative.   Gastrointestinal: Negative.   Genitourinary: Negative.   Musculoskeletal: Negative.   Skin: Negative.   Neurological: Negative.   Endo/Heme/Allergies: Negative.   Psychiatric/Behavioral: Negative.     Health Maintenance: Pap:  03-01-16 neg, 03-02-17 neg (hx of endometrial cancer) History of Abnormal Pap: yes MMG:  02-16-17 category b density birads 1:neg Self Breast exams: yes Colonoscopy:  2013 f/u 41yrs due to polyp hx BMD:   2015 TDaP:  2014 Shingles: had done Pneumonia: 2017 Hep C and HIV: both neg 2018 Labs: if needed   reports that she has never smoked. She has never used smokeless tobacco. She reports current alcohol use. She reports that she does not use drugs.  Past Medical History:  Diagnosis Date  . Abnormal Pap smear    many yrs ago  . Endometrial cancer ([place])   . Endometriosis   . Esophagus disorder   . Psoriatic arthritis ([place])   . Shingles   . Shingles     Past Surgical History:  Procedure Laterality Date  . ABDOMINAL HYSTERECTOMY     age 38  . APPENDECTOMY    . BILATERAL SALPINGOOPHORECTOMY Right 11/24/08   lysis of adhesion  . esophageal stretched    . EXPLORATORY LAPAROTOMY    . jaw bone graft  2004  . KNEE ARTHROPLASTY    . KNEE SURGERY    . LAPAROSCOPY     age 2  . LIPOSUCTION TRUNK  04/06/2016   Dr. Lenon Curt for body image  . LIVER BIOPSY      mild steatosis    Current Outpatient Medications  Medication Sig Dispense Refill  . Calcium 1500 MG tablet Take by mouth.    . Cholecalciferol (VITAMIN D PO) Take by mouth daily.    . ENBREL SURECLICK 50 MG/ML injection INJECT 1 PEN UNDER THE SKIN EVERY OTHER WEEK. 12 Syringe 0  . folic acid (FOLVITE) 604 MCG tablet Take 800 mcg by mouth.    Marland Kitchen ibuprofen (ADVIL,MOTRIN) 200 MG tablet Take 400 mg by mouth every 6 (six) hours as needed. For pain     . methotrexate (RHEUMATREX) 2.5 MG tablet Take 7 tablets (17.5 mg total) by mouth once a week. Caution:Chemotherapy. Protect from light. 84 tablet 0  . Multiple Vitamin (MULITIVITAMIN WITH MINERALS) TABS Take 1 tablet by mouth daily.      Marland Kitchen omeprazole (PRILOSEC) 40 MG capsule Take 40 mg by mouth as needed.  0  . valACYclovir (VALTREX) 1000 MG tablet as needed.  0   No current facility-administered medications for this visit.     Family History  Problem Relation Age of Onset  . Heart disease Mother   . Hypertension Mother   . Stroke Mother   . Diabetes Mother   . Osteoporosis Mother   . Heart disease Father   . Heart disease Brother   . Cancer  Paternal Aunt        ovarian  . Colon cancer Maternal Grandmother     ROS:  Pertinent items are noted in HPI.  Otherwise, a comprehensive ROS was negative.  Exam:   LMP 02/06/1982    Ht Readings from Last 3 Encounters:  12/25/17 5\' 2"  (1.575 m)  07/30/17 5\' 2"  (1.575 m)  04/16/17 5\' 2"  (1.575 m)    General appearance: alert, cooperative and appears stated age Head: Normocephalic, without obvious abnormality, atraumatic Neck: no adenopathy, supple, symmetrical, trachea midline and thyroid normal to inspection and palpation Lungs: clear to auscultation bilaterally Breasts: normal appearance, no masses or tenderness, No nipple retraction or dimpling, No nipple discharge or bleeding, No axillary or supraclavicular adenopathy Heart: regular rate and rhythm Abdomen: soft, non-tender; no  masses,  no organomegaly Extremities: extremities normal, atraumatic, no cyanosis or edema Skin: Skin color, texture, turgor normal. No rashes or lesions Lymph nodes: Cervical, supraclavicular, and axillary nodes normal. No abnormal inguinal nodes palpated Neurologic: Grossly normal   Pelvic: External genitalia:  no lesions              Urethra:  normal appearing urethra with no masses, tenderness or lesions              Bartholin's and Skene's: normal                 Vagina: normal appearing vagina with normal color and discharge, no lesions              Cervix: absent              Pap taken: Yes.   Bimanual Exam:  Uterus:  uterus absent              Adnexa: no mass, fullness, tenderness and adnexa surgically absent               Rectovaginal: Confirms               Anus:  normal sphincter tone, no lesions  Chaperone present: yes  A:  Well Woman with normal exam  Post menopausal s/p TAH with BSO endometrial cancer history  HSV no flares, but desires Valtrex update  Psoriatic Arthritis with Rheumatology management  Colonoscopy due, request referral   P:   Reviewed health and wellness pertinent to exam  Rx: Valtrex see order with instructions  Continue MD follow up as indicated  Patient will be referred to Dr. Collene Mares and will be called information   Pap smear: yes   counseled on mammography screening, feminine hygiene, adequate intake of calcium and vitamin D, diet and exercise, Kegel's exercises  return annually or prn  An After Visit Summary was printed and given to the patient.

## 2018-03-11 LAB — CYTOLOGY - PAP
DIAGNOSIS: NEGATIVE
HPV: NOT DETECTED

## 2018-03-12 ENCOUNTER — Other Ambulatory Visit: Payer: Self-pay | Admitting: Certified Nurse Midwife

## 2018-03-12 DIAGNOSIS — Z1231 Encounter for screening mammogram for malignant neoplasm of breast: Secondary | ICD-10-CM

## 2018-04-09 ENCOUNTER — Ambulatory Visit
Admission: RE | Admit: 2018-04-09 | Discharge: 2018-04-09 | Disposition: A | Discharge status: Home / Self Care | Payer: BLUE CROSS/BLUE SHIELD | Source: Ambulatory Visit | Attending: Certified Nurse Midwife | Admitting: Certified Nurse Midwife

## 2018-04-09 DIAGNOSIS — Z1231 Encounter for screening mammogram for malignant neoplasm of breast: Secondary | ICD-10-CM

## 2018-04-24 ENCOUNTER — Other Ambulatory Visit: Payer: Self-pay | Admitting: Rheumatology

## 2018-04-24 ENCOUNTER — Other Ambulatory Visit: Payer: Self-pay | Admitting: Physician Assistant

## 2018-04-24 NOTE — Telephone Encounter (Addendum)
Last Visit: 12/25/17 Next Visit due in April 2020.  Labs: 12/25/17 TB Gold: 10/01/17 Neg   Patient advised she is due to update 04/30/18  Okay to refill 30 day supply per Dr. Estanislado Pandy

## 2018-04-24 NOTE — Telephone Encounter (Signed)
ok 

## 2018-04-29 ENCOUNTER — Other Ambulatory Visit: Payer: Self-pay

## 2018-04-29 DIAGNOSIS — Z79899 Other long term (current) drug therapy: Secondary | ICD-10-CM

## 2018-04-30 LAB — CBC WITH DIFFERENTIAL/PLATELET
Absolute Monocytes: 578 cells/uL (ref 200–950)
Basophils Absolute: 61 cells/uL (ref 0–200)
Basophils Relative: 1.1 %
Eosinophils Absolute: 149 cells/uL (ref 15–500)
Eosinophils Relative: 2.7 %
HCT: 43.2 % (ref 35.0–45.0)
Hemoglobin: 14.6 g/dL (ref 11.7–15.5)
Lymphs Abs: 2013 cells/uL (ref 850–3900)
MCH: 30.2 pg (ref 27.0–33.0)
MCHC: 33.8 g/dL (ref 32.0–36.0)
MCV: 89.4 fL (ref 80.0–100.0)
MPV: 11.5 fL (ref 7.5–12.5)
Monocytes Relative: 10.5 %
Neutro Abs: 2701 cells/uL (ref 1500–7800)
Neutrophils Relative %: 49.1 %
PLATELETS: 204 10*3/uL (ref 140–400)
RBC: 4.83 10*6/uL (ref 3.80–5.10)
RDW: 15.1 % — ABNORMAL HIGH (ref 11.0–15.0)
TOTAL LYMPHOCYTE: 36.6 %
WBC: 5.5 10*3/uL (ref 3.8–10.8)

## 2018-04-30 LAB — COMPLETE METABOLIC PANEL WITH GFR
AG Ratio: 1.7 (calc) (ref 1.0–2.5)
ALT: 14 U/L (ref 6–29)
AST: 15 U/L (ref 10–35)
Albumin: 4.2 g/dL (ref 3.6–5.1)
Alkaline phosphatase (APISO): 72 U/L (ref 37–153)
BILIRUBIN TOTAL: 0.5 mg/dL (ref 0.2–1.2)
BUN: 13 mg/dL (ref 7–25)
CHLORIDE: 108 mmol/L (ref 98–110)
CO2: 29 mmol/L (ref 20–32)
Calcium: 9.1 mg/dL (ref 8.6–10.4)
Creat: 0.85 mg/dL (ref 0.50–0.99)
GFR, Est African American: 85 mL/min/{1.73_m2} (ref >=60)
GFR, Est Non African American: 73 mL/min/{1.73_m2} (ref >=60)
Globulin: 2.5 g/dL (calc) (ref 1.9–3.7)
Glucose, Bld: 73 mg/dL (ref 65–99)
Potassium: 4.3 mmol/L (ref 3.5–5.3)
Sodium: 143 mmol/L (ref 135–146)
TOTAL PROTEIN: 6.7 g/dL (ref 6.1–8.1)

## 2018-05-21 ENCOUNTER — Other Ambulatory Visit: Payer: Self-pay | Admitting: Physician Assistant

## 2018-05-21 NOTE — Telephone Encounter (Signed)
Last Visit: 12/25/2017 Next Visit: message sent to the front desk to schedule.  Labs: 04/29/2018 WNL   Okay to refill per Dr. Estanislado Pandy.

## 2018-06-20 ENCOUNTER — Other Ambulatory Visit: Payer: Self-pay | Admitting: Rheumatology

## 2018-06-20 NOTE — Telephone Encounter (Signed)
Last Visit: 12/25/2017 Next Visit: 07/09/18 Labs: 04/29/2018 WNL  TB gold: 10/01/17 Neg   Okay to refill per Dr. Estanislado Pandy

## 2018-06-25 NOTE — Progress Notes (Signed)
Office Visit Note  Patient: Cynthia Snyder             Date of Birth: 09/19/1955           MRN: [id number]             PCP: Chesley Noon, MD Referring: Chesley Noon, MD Visit Date: 07/09/2018 Occupation: @GUAROCC @  Subjective:  Pain in both hands    History of Present Illness: Kagan Hietpas is a 63 y.o. female with history of psoriatic arthritis and DDD.  She is on Enbrel 50 mg sq weekly injections, MTX 7 tablets po once weekly, and folic acid 2 mg po daily.  She states she increased the frequency of Enbrel after her appointment in November 2019.  She has noticed less arthralgias and joint stiffness since increasing the frequency to once weekly.  She denies any joint swelling at this time.  She states that she has been under increased rest for the past several weeks and has noticed increased arthralgias but denies any recent flares.  She has chronic left Achilles tendinitis but denies any plantar fasciitis.  She denies any SI joint pain at this time.  She denies any psoriasis. She continues to have right lower extremity muscle tenderness.  She went to physical therapy which abided minimal relief.  She states she also tried dry needling which made her discomfort worse.   Activities of Daily Living:  Patient reports morning stiffness for 30 minutes.   Patient Reports nocturnal pain.  Difficulty dressing/grooming: Denies Difficulty climbing stairs: Reports Difficulty getting out of chair: Denies Difficulty using hands for taps, buttons, cutlery, and/or writing: Denies  Review of Systems  Constitutional: Negative for fatigue.  HENT: Positive for mouth dryness. Negative for mouth sores and nose dryness.   Eyes: Negative for pain, itching, visual disturbance and dryness.  Respiratory: Negative for cough, hemoptysis, shortness of breath, wheezing and difficulty breathing.   Cardiovascular: Negative for chest pain, palpitations, hypertension and swelling in  legs/feet.  Gastrointestinal: Negative for abdominal pain, blood in stool, constipation and diarrhea.  Endocrine: Negative for increased urination.  Genitourinary: Negative for painful urination and pelvic pain.  Musculoskeletal: Positive for arthralgias, joint pain and morning stiffness. Negative for joint swelling, myalgias, muscle weakness, muscle tenderness and myalgias.  Skin: Negative for color change, pallor, rash, hair loss, nodules/bumps, redness, skin tightness, ulcers and sensitivity to sunlight.  Allergic/Immunologic: Negative for susceptible to infections.  Neurological: Negative for dizziness, light-headedness, numbness, headaches and memory loss.  Hematological: Negative for bruising/bleeding tendency and swollen glands.  Psychiatric/Behavioral: Negative for depressed mood, confusion and sleep disturbance. The patient is not nervous/anxious.     PMFS History:  Patient Active Problem List   Diagnosis Date Noted  . 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 Pain of right hip joint 03/26/2017  . Psoriatic arthropathy ([place]) 11/07/2016  . Psoriasis 08/04/2016  . DJD (degenerative joint disease), cervical 08/04/2016  . Esophagitis 08/04/2016  . Restless leg syndrome 08/04/2016  . High risk medication use 08/04/2016  . Atrophic vaginitis 01/22/2015    Class: Chronic    Past Medical History:  Diagnosis Date  . Abnormal Pap smear    many yrs ago  . Endometrial cancer ([place])   . Endometriosis   . Esophagus disorder   . Psoriatic arthritis ([place])   . Shingles   . Shingles     Family History  Problem Relation Age of Onset  . Heart disease Mother   . Hypertension  Mother   . Stroke Mother   . Diabetes Mother   . Osteoporosis Mother   . Heart disease Father   . Heart disease Brother   . Cancer Paternal Aunt        ovarian  . Colon cancer Maternal Grandmother   . Breast cancer Neg Hx    Past Surgical History:  Procedure Laterality Date  . ABDOMINAL  HYSTERECTOMY     age 71  . APPENDECTOMY    . BILATERAL SALPINGOOPHORECTOMY Right 11/24/08   lysis of adhesion  . esophageal stretched    . EXPLORATORY LAPAROTOMY    . jaw bone graft  2004  . KNEE ARTHROPLASTY    . KNEE SURGERY    . LAPAROSCOPY     age 37  . LIPOSUCTION TRUNK  04/06/2016   Dr. Lenon Curt for body image  . LIVER BIOPSY     mild steatosis   Social History   Social History Narrative  . Not on file   Immunization History  Administered Date(s) Administered  . Influenza Inj Mdck Quad Pf 10/23/2017  . Influenza Split 10/20/2017  . Influenza, Seasonal, Injecte, Preservative Fre 10/29/2013, 12/08/2014, 11/18/2015  . Tdap 11/06/2012  . Zoster 10/01/2015     Objective: Vital Signs: BP (!) 133/91 (BP Location: Left Arm, Patient Position: Sitting, Cuff Size: Normal)   Pulse 67   Resp 13   Ht 5\' 2"  (1.575 m)   Wt 175 lb (79.4 kg)   LMP 02/06/1982   BMI 32.01 kg/m    Physical Exam Vitals signs and nursing note reviewed.  Constitutional:      Appearance: She is well-developed.  HENT:     Head: Normocephalic and atraumatic.  Eyes:     Conjunctiva/sclera: Conjunctivae normal.  Neck:     Musculoskeletal: Normal range of motion.  Cardiovascular:     Rate and Rhythm: Normal rate and regular rhythm.     Heart sounds: Normal heart sounds.  Pulmonary:     Effort: Pulmonary effort is normal.     Breath sounds: Normal breath sounds.  Abdominal:     General: Bowel sounds are normal.     Palpations: Abdomen is soft.  Lymphadenopathy:     Cervical: No cervical adenopathy.  Skin:    General: Skin is warm and dry.     Capillary Refill: Capillary refill takes less than 2 seconds.  Neurological:     Mental Status: She is alert and oriented to person, place, and time.  Psychiatric:        Behavior: Behavior normal.      Musculoskeletal Exam: C-spine, thoracic spine and lumbar spine good range of motion.  Shoulder joints, elbow joints, wrist joints, MCPs, PIPs, DIPs  good range of motion with no synovitis.  She has PIP and DIP synovial thickening but no tenderness or synovitis.  Hip joints, knee joints, ankle joints, MCPs, PIPs, DIPs good range of motion no synovitis.  No warmth or effusion bilateral knee joints.  No tenderness or swelling of ankle joints.  No tenderness over trochanter bursa bilaterally.  She has PIP and DIP synovial thickening consistent with osteoarthritis of bilateral feet.  No plantar fasciitis noted.  CDAI Exam: CDAI Score: Not documented Patient Global Assessment: Not documented; Provider Global Assessment: Not documented Swollen: Not documented; Tender: Not documented Joint Exam   Not documented   There is currently no information documented on the homunculus. Go to the Rheumatology activity and complete the homunculus joint exam.  Investigation: No additional findings.  Imaging: No results found.  Recent Labs: Lab Results  Component Value Date   WBC 5.5 04/29/2018   HGB 14.6 04/29/2018   PLT 204 04/29/2018   NA 143 04/29/2018   K 4.3 04/29/2018   CL 108 04/29/2018   CO2 29 04/29/2018   GLUCOSE 73 04/29/2018   BUN 13 04/29/2018   CREATININE 0.85 04/29/2018   BILITOT 0.5 04/29/2018   ALKPHOS 82 08/07/2016   AST 15 04/29/2018   ALT 14 04/29/2018   PROT 6.7 04/29/2018   ALBUMIN 4.4 08/07/2016   CALCIUM 9.1 04/29/2018   GFRAA 85 04/29/2018   QFTBGOLDPLUS NEGATIVE 10/01/2017    Speciality Comments: No specialty comments available.  Procedures:  No procedures performed Allergies: Patient has no known allergies.   Assessment / Plan:     Visit Diagnoses: Psoriatic arthropathy ([place]): She has no synovitis or dactylitis on exam.  She has not had any recent psoriatic arthritis flares.  She is clinically doing well on Enbrel 50 mg subcutaneous injections once weekly, methotrexate 7 tablets by mouth once weekly, folic acid 2 mg by mouth daily.  She was previously on Enbrel subcu injections every 14 days but she  increased the frequency of the injections after appointment in November 2019.  She has noticed increased arthralgias and joint stiffness since increasing the frequency of Enbrel.  She has chronic left Achilles tendinitis but denies any plantar fasciitis bilaterally.  She denies any SI joint tenderness.  She has no psoriasis at this time.  She will continue on her current treatment regimen.  She does not need any refills at this time.  She was advised to notify us if develops increased joint pain or joint swelling.  She will follow-up in the office in 5 months.  Psoriasis: She has no active psoriasis at this time.   High risk medication use - Enbrel q o wk, MTX 7 tabs po qwk (4 on Saturdays and 3 on Sundays), folic acid 2 mg po qd.  Last TB gold negative on 10/01/2017 and will monitor yearly.  Future order for TB gold was placed today.  Most recent CBC/CMP within normal limits on 04/29/2018 and will monitor every 3 months.  Standing orders are in place.  She received her flu vaccine in September and previously Zostavax.  She was advised to hold Enbrel and methotrexate if she develops signs or symptoms of an infection and resume once the infection is completely cleared.  We discussed the importance of social distancing and following the standard precautions recommended by the CDC.- Plan: CBC with Differential/Platelet, COMPLETE METABOLIC PANEL WITH GFR, QuantiFERON-TB Gold Plus  DDD (degenerative disc disease), cervical: She has good range of motion with no discomfort at this time.  She has no symptoms of radiculopathy.  Muscular deconditioning: She was referred to physical therapy which after muscle strengthening overall.  She continues to have muscle tenderness in the right lower extremity.  She tried dry needling which made the discomfort worse.  She declined x-rays of her back at this time.  She was given a handout of back exercises to perform.   Other medical conditions are listed as follows:   History  of esophagitis  History of restless legs syndrome   Orders: Orders Placed This Encounter  Procedures  . CBC with Differential/Platelet  . COMPLETE METABOLIC PANEL WITH GFR  . QuantiFERON-TB Gold Plus   No orders of the defined types were placed in this encounter.    Follow-Up Instructions: Return in about 5 months (around  12/09/2018) for Psoriatic arthritis.   Ofilia Neas, PA-C   I examined and evaluated the patient with Hazel Sams PA.  Trolled.  She had no synovitis on examination today.  She is been experiencing some discomfort in the right lower extremity on the lateral aspect of her calf.  She denies any knee joint pain.  No warmth swelling or effusion was noted.  She also has chronic lower back pain.  I offered x-ray of her lumbar spine which she declined.  I have given her a handout on back exercises.  We will see response to that.  Have advised patient to contact us in case her symptoms get worse.  The plan of care was discussed as noted above.  Bo Merino, MD Note - This record has been created using Editor, commissioning.  Chart creation errors have been sought, but may not always  have been located. Such creation errors do not reflect on  the standard of medical care.

## 2018-07-09 ENCOUNTER — Other Ambulatory Visit: Payer: Self-pay

## 2018-07-09 ENCOUNTER — Ambulatory Visit (INDEPENDENT_AMBULATORY_CARE_PROVIDER_SITE_OTHER): Payer: BC Managed Care – PPO | Admitting: Rheumatology

## 2018-07-09 ENCOUNTER — Encounter: Payer: Self-pay | Admitting: Rheumatology

## 2018-07-09 VITALS — BP 133/91 | HR 67 | Resp 13 | Ht 62.0 in | Wt 175.0 lb

## 2018-07-09 DIAGNOSIS — Z79899 Other long term (current) drug therapy: Secondary | ICD-10-CM

## 2018-07-09 DIAGNOSIS — L4050 Arthropathic psoriasis, unspecified: Secondary | ICD-10-CM | POA: Diagnosis not present

## 2018-07-09 DIAGNOSIS — L409 Psoriasis, unspecified: Secondary | ICD-10-CM | POA: Diagnosis not present

## 2018-07-09 DIAGNOSIS — Z8719 Personal history of other diseases of the digestive system: Secondary | ICD-10-CM

## 2018-07-09 DIAGNOSIS — R29898 Other symptoms and signs involving the musculoskeletal system: Secondary | ICD-10-CM

## 2018-07-09 DIAGNOSIS — M5030 Other cervical disc degeneration, unspecified cervical region: Secondary | ICD-10-CM

## 2018-07-09 DIAGNOSIS — Z8669 Personal history of other diseases of the nervous system and sense organs: Secondary | ICD-10-CM

## 2018-07-09 NOTE — Patient Instructions (Signed)

## 2018-08-01 ENCOUNTER — Other Ambulatory Visit: Payer: Self-pay

## 2018-08-01 DIAGNOSIS — Z79899 Other long term (current) drug therapy: Secondary | ICD-10-CM

## 2018-08-02 NOTE — Progress Notes (Signed)
CBC and CMP WNL

## 2018-08-03 LAB — CBC WITH DIFFERENTIAL/PLATELET
Absolute Monocytes: 270 cells/uL (ref 200–950)
Basophils Absolute: 48 cells/uL (ref 0–200)
Basophils Relative: 0.9 %
Eosinophils Absolute: 122 cells/uL (ref 15–500)
Eosinophils Relative: 2.3 %
HCT: 41.3 % (ref 35.0–45.0)
Hemoglobin: 14 g/dL (ref 11.7–15.5)
Lymphs Abs: 2088 cells/uL (ref 850–3900)
MCH: 30.6 pg (ref 27.0–33.0)
MCHC: 33.9 g/dL (ref 32.0–36.0)
MCV: 90.2 fL (ref 80.0–100.0)
MPV: 10.6 fL (ref 7.5–12.5)
Monocytes Relative: 5.1 %
Neutro Abs: 2772 cells/uL (ref 1500–7800)
Neutrophils Relative %: 52.3 %
Platelets: 231 10*3/uL (ref 140–400)
RBC: 4.58 10*6/uL (ref 3.80–5.10)
RDW: 14.5 % (ref 11.0–15.0)
Total Lymphocyte: 39.4 %
WBC: 5.3 10*3/uL (ref 3.8–10.8)

## 2018-08-03 LAB — COMPLETE METABOLIC PANEL WITH GFR
AG Ratio: 1.8 (calc) (ref 1.0–2.5)
ALT: 19 U/L (ref 6–29)
AST: 19 U/L (ref 10–35)
Albumin: 4.2 g/dL (ref 3.6–5.1)
Alkaline phosphatase (APISO): 72 U/L (ref 37–153)
BUN: 17 mg/dL (ref 7–25)
CO2: 29 mmol/L (ref 20–32)
Calcium: 9.3 mg/dL (ref 8.6–10.4)
Chloride: 105 mmol/L (ref 98–110)
Creat: 0.78 mg/dL (ref 0.50–0.99)
GFR, Est African American: 94 mL/min/{1.73_m2} (ref >=60)
GFR, Est Non African American: 81 mL/min/{1.73_m2} (ref >=60)
Globulin: 2.4 g/dL (calc) (ref 1.9–3.7)
Glucose, Bld: 88 mg/dL (ref 65–99)
Potassium: 4.2 mmol/L (ref 3.5–5.3)
Sodium: 140 mmol/L (ref 135–146)
Total Bilirubin: 0.6 mg/dL (ref 0.2–1.2)
Total Protein: 6.6 g/dL (ref 6.1–8.1)

## 2018-08-03 LAB — QUANTIFERON-TB GOLD PLUS
Mitogen-NIL: >10 IU/mL
NIL: 0.01 IU/mL
QuantiFERON-TB Gold Plus: NEGATIVE
TB1-NIL: 0 IU/mL
TB2-NIL: 0 IU/mL

## 2018-08-05 NOTE — Progress Notes (Signed)
TB gold negative

## 2018-08-29 ENCOUNTER — Other Ambulatory Visit: Payer: Self-pay | Admitting: Rheumatology

## 2018-08-29 NOTE — Telephone Encounter (Signed)
Last Visit: 07/09/18 Next visit: 10/10/18 Labs: 08/01/18 WNL  Okay to refill per Dr. Estanislado Pandy

## 2018-09-26 NOTE — Progress Notes (Deleted)
Office Visit Note  Patient: Cynthia Snyder             Date of Birth: 1955/05/12           MRN: [id number]             PCP: Chesley Noon, MD Referring: Chesley Noon, MD Visit Date: 10/10/2018 Occupation: @GUAROCC @  Subjective:  No chief complaint on file.   History of Present Illness: Cynthia Snyder is a 63 y.o. female ***   Activities of Daily Living:  Patient reports morning stiffness for *** {minute/hour:19697}.   Patient {ACTIONS;DENIES/REPORTS:21021675::"Denies"} nocturnal pain.  Difficulty dressing/grooming: {ACTIONS;DENIES/REPORTS:21021675::"Denies"} Difficulty climbing stairs: {ACTIONS;DENIES/REPORTS:21021675::"Denies"} Difficulty getting out of chair: {ACTIONS;DENIES/REPORTS:21021675::"Denies"} Difficulty using hands for taps, buttons, cutlery, and/or writing: {ACTIONS;DENIES/REPORTS:21021675::"Denies"}  No Rheumatology ROS completed.   PMFS History:  Patient Active Problem List   Diagnosis Date Noted  . 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 Pain of right hip joint 03/26/2017  . Psoriatic arthropathy ([place]) 11/07/2016  . Psoriasis 08/04/2016  . DJD (degenerative joint disease), cervical 08/04/2016  . Esophagitis 08/04/2016  . Restless leg syndrome 08/04/2016  . High risk medication use 08/04/2016  . Atrophic vaginitis 01/22/2015    Class: Chronic    Past Medical History:  Diagnosis Date  . Abnormal Pap smear    many yrs ago  . Endometrial cancer ([place] )   . Endometriosis   . Esophagus disorder   . Psoriatic arthritis ([place])   . Shingles   . Shingles     Family History  Problem Relation Age of Onset  . Heart disease Mother   . Hypertension Mother   . Stroke Mother   . Diabetes Mother   . Osteoporosis Mother   . Heart disease Father   . Heart disease Brother   . Cancer Paternal Aunt        ovarian  . Colon cancer Maternal Grandmother   . Breast cancer Neg Hx    Past Surgical History:  Procedure Laterality  Date  . ABDOMINAL HYSTERECTOMY     age 33  . APPENDECTOMY    . BILATERAL SALPINGOOPHORECTOMY Right 11/24/08   lysis of adhesion  . esophageal stretched    . EXPLORATORY LAPAROTOMY    . jaw bone graft  2004  . KNEE ARTHROPLASTY    . KNEE SURGERY    . LAPAROSCOPY     age 43  . LIPOSUCTION TRUNK  04/06/2016   Dr. Lenon Curt for body image  . LIVER BIOPSY     mild steatosis   Social History   Social History Narrative  . Not on file   Immunization History  Administered Date(s) Administered  . Influenza Inj Mdck Quad Pf 10/23/2017  . Influenza Split 10/20/2017  . Influenza, Seasonal, Injecte, Preservative Fre 10/29/2013, 12/08/2014, 11/18/2015  . Tdap 11/06/2012  . Zoster 10/01/2015     Objective: Vital Signs: LMP 02/06/1982    Physical Exam   Musculoskeletal Exam: ***  CDAI Exam: CDAI Score: - Patient Global: -; Provider Global: - Swollen: -; Tender: - Joint Exam   No joint exam has been documented for this visit   There is currently no information documented on the homunculus. Go to the Rheumatology activity and complete the homunculus joint exam.  Investigation: No additional findings.  Imaging: No results found.  Recent Labs: Lab Results  Component Value Date   WBC 5.3 08/01/2018   HGB 14.0 08/01/2018   PLT 231 08/01/2018   NA 140 08/01/2018  K 4.2 08/01/2018   CL 105 08/01/2018   CO2 29 08/01/2018   GLUCOSE 88 08/01/2018   BUN 17 08/01/2018   CREATININE 0.78 08/01/2018   BILITOT 0.6 08/01/2018   ALKPHOS 82 08/07/2016   AST 19 08/01/2018   ALT 19 08/01/2018   PROT 6.6 08/01/2018   ALBUMIN 4.4 08/07/2016   CALCIUM 9.3 08/01/2018   GFRAA 94 08/01/2018   QFTBGOLDPLUS NEGATIVE 08/01/2018    Speciality Comments: No specialty comments available.  Procedures:  No procedures performed Allergies: Patient has no known allergies.   Assessment / Plan:     Visit Diagnoses: No diagnosis found.  Orders: No orders of the defined types were placed in  this encounter.  No orders of the defined types were placed in this encounter.   Face-to-face time spent with patient was *** minutes. Greater than 50% of time was spent in counseling and coordination of care.  Follow-Up Instructions: No follow-ups on file.   Ofilia Neas, PA-C  Note - This record has been created using Dragon software.  Chart creation errors have been sought, but may not always  have been located. Such creation errors do not reflect on  the standard of medical care.

## 2018-10-07 NOTE — Progress Notes (Deleted)
Office Visit Note  Patient: Cynthia Snyder             Date of Birth: August 05, 1955           MRN: [id number]             PCP: Chesley Noon, MD Referring: Chesley Noon, MD Visit Date: 10/17/2018 Occupation: @GUAROCC @  Subjective:  No chief complaint on file.   History of Present Illness: Cynthia Snyder is a 63 y.o. female ***   Activities of Daily Living:  Patient reports morning stiffness for *** {minute/hour:19697}.   Patient {ACTIONS;DENIES/REPORTS:21021675::"Denies"} nocturnal pain.  Difficulty dressing/grooming: {ACTIONS;DENIES/REPORTS:21021675::"Denies"} Difficulty climbing stairs: {ACTIONS;DENIES/REPORTS:21021675::"Denies"} Difficulty getting out of chair: {ACTIONS;DENIES/REPORTS:21021675::"Denies"} Difficulty using hands for taps, buttons, cutlery, and/or writing: {ACTIONS;DENIES/REPORTS:21021675::"Denies"}  No Rheumatology ROS completed.   PMFS History:  Patient Active Problem List   Diagnosis Date Noted  . 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 Pain of right hip joint 03/26/2017  . Psoriatic arthropathy ([place]) 11/07/2016  . Psoriasis 08/04/2016  . DJD (degenerative joint disease), cervical 08/04/2016  . Esophagitis 08/04/2016  . Restless leg syndrome 08/04/2016  . High risk medication use 08/04/2016  . Atrophic vaginitis 01/22/2015    Class: Chronic    Past Medical History:  Diagnosis Date  . Abnormal Pap smear    many yrs ago  . Endometrial cancer ([place])   . Endometriosis   . Esophagus disorder   . Psoriatic arthritis ([place])   . Shingles   . Shingles     Family History  Problem Relation Age of Onset  . Heart disease Mother   . Hypertension Mother   . Stroke Mother   . Diabetes Mother   . Osteoporosis Mother   . Heart disease Father   . Heart disease Brother   . Cancer Paternal Aunt        ovarian  . Colon cancer Maternal Grandmother   . Breast cancer Neg Hx    Past Surgical History:  Procedure Laterality  Date  . ABDOMINAL HYSTERECTOMY     age 33  . APPENDECTOMY    . BILATERAL SALPINGOOPHORECTOMY Right 11/24/08   lysis of adhesion  . esophageal stretched    . EXPLORATORY LAPAROTOMY    . jaw bone graft  2004  . KNEE ARTHROPLASTY    . KNEE SURGERY    . LAPAROSCOPY     age 17  . LIPOSUCTION TRUNK  04/06/2016   Dr. Lenon Curt for body image  . LIVER BIOPSY     mild steatosis   Social History   Social History Narrative  . Not on file   Immunization History  Administered Date(s) Administered  . Influenza Inj Mdck Quad Pf 10/23/2017  . Influenza Split 10/20/2017  . Influenza, Seasonal, Injecte, Preservative Fre 10/29/2013, 12/08/2014, 11/18/2015  . Tdap 11/06/2012  . Zoster 10/01/2015     Objective: Vital Signs: LMP 02/06/1982    Physical Exam   Musculoskeletal Exam: ***  CDAI Exam: CDAI Score: - Patient Global: -; Provider Global: - Swollen: -; Tender: - Joint Exam   No joint exam has been documented for this visit   There is currently no information documented on the homunculus. Go to the Rheumatology activity and complete the homunculus joint exam.  Investigation: No additional findings.  Imaging: No results found.  Recent Labs: Lab Results  Component Value Date   WBC 5.3 08/01/2018   HGB 14.0 08/01/2018   PLT 231 08/01/2018   NA 140 08/01/2018  K 4.2 08/01/2018   CL 105 08/01/2018   CO2 29 08/01/2018   GLUCOSE 88 08/01/2018   BUN 17 08/01/2018   CREATININE 0.78 08/01/2018   BILITOT 0.6 08/01/2018   ALKPHOS 82 08/07/2016   AST 19 08/01/2018   ALT 19 08/01/2018   PROT 6.6 08/01/2018   ALBUMIN 4.4 08/07/2016   CALCIUM 9.3 08/01/2018   GFRAA 94 08/01/2018   QFTBGOLDPLUS NEGATIVE 08/01/2018    Speciality Comments: No specialty comments available.  Procedures:  No procedures performed Allergies: Patient has no known allergies.   Assessment / Plan:     Visit Diagnoses: No diagnosis found.  Orders: No orders of the defined types were placed in  this encounter.  No orders of the defined types were placed in this encounter.   Face-to-face time spent with patient was *** minutes. Greater than 50% of time was spent in counseling and coordination of care.  Follow-Up Instructions: No follow-ups on file.   Ofilia Neas, PA-C  Note - This record has been created using Dragon software.  Chart creation errors have been sought, but may not always  have been located. Such creation errors do not reflect on  the standard of medical care.

## 2018-10-10 ENCOUNTER — Ambulatory Visit: Payer: BC Managed Care – PPO | Admitting: Physician Assistant

## 2018-10-10 NOTE — Progress Notes (Signed)
Office Visit Note  Patient: Cynthia Snyder             Date of Birth: 23-Sep-1955           MRN: [id number]             PCP: Chesley Noon, MD Referring: Chesley Noon, MD Visit Date: 10/24/2018 Occupation: @GUAROCC @  Subjective:  Medication monitoring     History of Present Illness: Cynthia Snyder is a 63 y.o. female with history of psoriatic arthritis flares.  She is on Enbrel 50 mg sq weekly injections and MTX 7 tablets po once weekly.  She reports that she was previously injecting Enbrel every 14 days but started having increased arthralgias and resumed weekly injections.  She denies any recent flares.  She states she recently missed 1 week of Enbrel and MTX due to visiting her mother who was in the hospital recently.  She denies any joint pain or joint swelling at this time.  She denied any Achilles tendinitis or plantar fasciitis.  She denies any SI joint pain.  She denies any active psoriasis.  She states that she has noticed worsening varicose veins in her lower extremities but has not followed up with a vascular specialist yet.  Activities of Daily Living:  Patient reports morning stiffness for 20-30  minutes.   Patient Denies nocturnal pain.  Difficulty dressing/grooming: Denies Difficulty climbing stairs: Denies Difficulty getting out of chair: Denies Difficulty using hands for taps, buttons, cutlery, and/or writing: Denies  Review of Systems  Constitutional: Positive for fatigue.  HENT: Positive for mouth dryness. Negative for mouth sores and nose dryness.   Eyes: Negative for pain, visual disturbance and dryness.  Respiratory: Negative for cough, hemoptysis, shortness of breath and difficulty breathing.   Cardiovascular: Negative for chest pain, palpitations, hypertension and swelling in legs/feet.  Gastrointestinal: Negative for blood in stool, constipation and diarrhea.  Endocrine: Negative for increased urination.  Genitourinary: Negative for  painful urination.  Musculoskeletal: Positive for morning stiffness. Negative for arthralgias, joint pain, joint swelling, myalgias, muscle weakness, muscle tenderness and myalgias.  Skin: Negative for color change, pallor, rash, hair loss, nodules/bumps, skin tightness, ulcers and sensitivity to sunlight.  Allergic/Immunologic: Negative for susceptible to infections.  Neurological: Negative for dizziness, numbness, headaches and weakness.  Hematological: Negative for swollen glands.  Psychiatric/Behavioral: Positive for sleep disturbance. Negative for depressed mood. The patient is not nervous/anxious.     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   Pain of right hip joint 03/26/2017   Psoriatic arthropathy ([place]) 11/07/2016   Psoriasis 08/04/2016   DJD (degenerative joint disease), cervical 08/04/2016   Esophagitis 08/04/2016   Restless leg syndrome 08/04/2016   High risk medication use 08/04/2016   Atrophic vaginitis 01/22/2015    Class: Chronic    Past Medical History:  Diagnosis Date   Abnormal Pap smear    many yrs ago   Endometrial cancer ([place])    Endometriosis    Esophagus disorder    Psoriatic arthritis ([place])    Shingles    Shingles     Family History  Problem Relation Age of Onset   Heart disease Mother    Hypertension Mother    Stroke Mother    Diabetes Mother    Osteoporosis Mother    Hypothyroidism Mother    Heart disease Father    Heart disease Brother    Cancer Paternal  Aunt        ovarian   Colon cancer Maternal Grandmother    Breast cancer Neg Hx    Past Surgical History:  Procedure Laterality Date   ABDOMINAL HYSTERECTOMY     age 40   APPENDECTOMY     BILATERAL SALPINGOOPHORECTOMY Right 11/24/08   lysis of adhesion   esophageal stretched     EXPLORATORY LAPAROTOMY     jaw bone graft  2004   KNEE ARTHROPLASTY     KNEE SURGERY       LAPAROSCOPY     age 36   LIPOSUCTION TRUNK  04/06/2016   Dr. Lenon Curt for body image   LIVER BIOPSY     mild steatosis   Social History   Social History Narrative   Not on file   Immunization History  Administered Date(s) Administered   Influenza Inj Mdck Quad Pf 10/23/2017   Influenza Split 10/20/2017   Influenza, Seasonal, Injecte, Preservative Fre 10/29/2013, 12/08/2014, 11/18/2015   Tdap 11/06/2012   Zoster 10/01/2015     Objective: Vital Signs: BP (!) 151/99 (BP Location: Left Arm, Patient Position: Sitting, Cuff Size: Small)    Pulse 73    Resp 13    Ht 5\' 2"  (1.575 m)    Wt 180 lb 6.4 oz (81.8 kg)    LMP 02/06/1982    BMI 33.00 kg/m    Physical Exam Vitals signs and nursing note reviewed.  Constitutional:      Appearance: She is well-developed.  HENT:     Head: Normocephalic and atraumatic.  Eyes:     Conjunctiva/sclera: Conjunctivae normal.  Neck:     Musculoskeletal: Normal range of motion.  Cardiovascular:     Rate and Rhythm: Normal rate and regular rhythm.     Heart sounds: Normal heart sounds.  Pulmonary:     Effort: Pulmonary effort is normal.     Breath sounds: Normal breath sounds.  Abdominal:     General: Bowel sounds are normal.     Palpations: Abdomen is soft.  Lymphadenopathy:     Cervical: No cervical adenopathy.  Skin:    General: Skin is warm and dry.     Capillary Refill: Capillary refill takes less than 2 seconds.  Neurological:     Mental Status: She is alert and oriented to person, place, and time.  Psychiatric:        Behavior: Behavior normal.      Musculoskeletal Exam: C-spine, thoracic spine, and lumbar spine good ROM.  No midline spinal tenderness. No SI joint tenderness.  Shoulder joints, elbow joints, wrist joints, MCPs, PIPs, and DIPs good ROM with no synovitis. PIP and DIP synovial thickening.  Mucin cyst on left index finger. Hip joints, knee joints, ankle joints, MTPs, PIPs, and DIPs good ROM with no synovitis.  No  warmth or effusion of knee joints.  No tenderness or inflammation of ankle joints.  No achilles tendonitis or plantar fasciitis.  Lipoma on lateral aspect of right lower extremity.   CDAI Exam: CDAI Score: -- Patient Global: --; Provider Global: -- Swollen: --; Tender: -- Joint Exam   No joint exam has been documented for this visit   There is currently no information documented on the homunculus. Go to the Rheumatology activity and complete the homunculus joint exam.  Investigation: No additional findings.  Imaging: No results found.  Recent Labs: Lab Results  Component Value Date   WBC 5.3 08/01/2018   HGB 14.0 08/01/2018   PLT 231 08/01/2018  NA 140 08/01/2018   K 4.2 08/01/2018   CL 105 08/01/2018   CO2 29 08/01/2018   GLUCOSE 88 08/01/2018   BUN 17 08/01/2018   CREATININE 0.78 08/01/2018   BILITOT 0.6 08/01/2018   ALKPHOS 82 08/07/2016   AST 19 08/01/2018   ALT 19 08/01/2018   PROT 6.6 08/01/2018   ALBUMIN 4.4 08/07/2016   CALCIUM 9.3 08/01/2018   GFRAA 94 08/01/2018   QFTBGOLDPLUS NEGATIVE 08/01/2018    Speciality Comments: No specialty comments available.  Procedures:  No procedures performed Allergies: Patient has no known allergies.   Assessment / Plan:     Visit Diagnoses: Psoriatic arthropathy ([place]): She has no synovitis or dactylitis on exam.  She has not had any recent psoriatic arthritis flares.  She is clinically doing well on Enbrel 50 mg subcutaneous injections once weekly and methotrexate 7 tablets by mouth once weekly.  She has no joint pain or joint swelling at this time.  She has no Achilles tendinitis or plantar fasciitis.  No SI joint tenderness.  She has no active psoriasis at this time.  She will continue on Enbrel 50 mg subcutaneous injections every 7 days and MTX 7 tablets po once weekly.  She previously tried spacing Enbrel to every 14 days but experienced increased arthralgias and joint stiffness.  She does not need any refills of Enbrel  or methotrexate at this time.  She was advised to notify us if she develops increased joint pain or joint swelling.  She will follow-up in the office in 5 months.  Psoriasis: She has no active psoriasis at this time.  High risk medication use - Enbrel 50 mg sq weekly injections, MTX 7 tabs po qwk (4 on Saturdays and 3 on Sundays), folic acid 2 mg po qd. CBC and CMP will be drawn today to monitor for drug toxicity.  TB gold negative on 08/01/2018.  She will follow-up for lab work in December and every 3 months to monitor for drug toxicity.  I discussed the importance of holding Enbrel and methotrexate if she develops any signs or symptoms of an infection and to resume once the infection has completely cleared.  The importance of social distancing and following standard precautions recommended by the CDC were discussed.- Plan: CBC with Differential/Platelet, COMPLETE METABOLIC PANEL WITH GFR  DDD (degenerative disc disease), cervical: She has good range of motion with no discomfort at this time.  She has no symptoms of radiculopathy.  Muscular deconditioning: She has no difficulty getting up from a chair or climbing steps.  Other medical conditions are listed as follows:  History of restless legs syndrome  History of esophagitis  Orders: Orders Placed This Encounter  Procedures   CBC with Differential/Platelet   COMPLETE METABOLIC PANEL WITH GFR   No orders of the defined types were placed in this encounter.   Follow-Up Instructions: Return in about 5 months (around 03/26/2019) for Psoriatic arthritis, DDD.   Ofilia Neas, PA-C  Note - This record has been created using Dragon software.  Chart creation errors have been sought, but may not always  have been located. Such creation errors do not reflect on  the standard of medical care.

## 2018-10-17 ENCOUNTER — Ambulatory Visit: Payer: BC Managed Care – PPO | Admitting: Physician Assistant

## 2018-10-24 ENCOUNTER — Ambulatory Visit (INDEPENDENT_AMBULATORY_CARE_PROVIDER_SITE_OTHER): Payer: BC Managed Care – PPO | Admitting: Physician Assistant

## 2018-10-24 ENCOUNTER — Encounter: Payer: Self-pay | Admitting: Physician Assistant

## 2018-10-24 ENCOUNTER — Other Ambulatory Visit: Payer: Self-pay

## 2018-10-24 VITALS — BP 151/99 | HR 73 | Resp 13 | Ht 62.0 in | Wt 180.4 lb

## 2018-10-24 DIAGNOSIS — L4050 Arthropathic psoriasis, unspecified: Secondary | ICD-10-CM

## 2018-10-24 DIAGNOSIS — Z8669 Personal history of other diseases of the nervous system and sense organs: Secondary | ICD-10-CM

## 2018-10-24 DIAGNOSIS — L409 Psoriasis, unspecified: Secondary | ICD-10-CM

## 2018-10-24 DIAGNOSIS — Z8719 Personal history of other diseases of the digestive system: Secondary | ICD-10-CM

## 2018-10-24 DIAGNOSIS — M5030 Other cervical disc degeneration, unspecified cervical region: Secondary | ICD-10-CM | POA: Diagnosis not present

## 2018-10-24 DIAGNOSIS — R29898 Other symptoms and signs involving the musculoskeletal system: Secondary | ICD-10-CM

## 2018-10-24 DIAGNOSIS — Z79899 Other long term (current) drug therapy: Secondary | ICD-10-CM | POA: Diagnosis not present

## 2018-10-24 NOTE — Patient Instructions (Signed)
Standing Labs We placed an order today for your standing lab work.    Please come back and get your standing labs in December and every 3 months  We have open lab daily Monday through Thursday from 8:30-12:30 PM and 1:30-4:30 PM and Friday from 8:30-12:30 PM and 1:30 -4:00 PM at the office of Dr. Shaili Deveshwar.   You may experience shorter wait times on Monday and Friday afternoons. The office is located at [address], [address], [place], [place] [address] No appointment is necessary.   Labs are drawn by [facility].  You may receive a bill from [facility] for your lab work.  If you wish to have your labs drawn at another location, please call the office 24 hours in advance to send orders.  If you have any questions regarding directions or hours of operation,  please call [phone].   Just as a reminder please drink plenty of water prior to coming for your lab work. Thanks!  

## 2018-10-25 LAB — COMPLETE METABOLIC PANEL WITH GFR
AG Ratio: 1.7 (calc) (ref 1.0–2.5)
ALT: 15 U/L (ref 6–29)
AST: 17 U/L (ref 10–35)
Albumin: 4.4 g/dL (ref 3.6–5.1)
Alkaline phosphatase (APISO): 80 U/L (ref 37–153)
BUN: 14 mg/dL (ref 7–25)
CO2: 26 mmol/L (ref 20–32)
Calcium: 9.3 mg/dL (ref 8.6–10.4)
Chloride: 105 mmol/L (ref 98–110)
Creat: 0.84 mg/dL (ref 0.50–0.99)
GFR, Est African American: 86 mL/min/{1.73_m2} (ref >=60)
GFR, Est Non African American: 74 mL/min/{1.73_m2} (ref >=60)
Globulin: 2.6 g/dL (calc) (ref 1.9–3.7)
Glucose, Bld: 77 mg/dL (ref 65–99)
Potassium: 4 mmol/L (ref 3.5–5.3)
Sodium: 142 mmol/L (ref 135–146)
Total Bilirubin: 0.4 mg/dL (ref 0.2–1.2)
Total Protein: 7 g/dL (ref 6.1–8.1)

## 2018-10-25 LAB — CBC WITH DIFFERENTIAL/PLATELET
Absolute Monocytes: 655 cells/uL (ref 200–950)
Basophils Absolute: 46 cells/uL (ref 0–200)
Basophils Relative: 0.6 %
Eosinophils Absolute: 146 cells/uL (ref 15–500)
Eosinophils Relative: 1.9 %
HCT: 43.8 % (ref 35.0–45.0)
Hemoglobin: 14.8 g/dL (ref 11.7–15.5)
Lymphs Abs: 2857 cells/uL (ref 850–3900)
MCH: 30.4 pg (ref 27.0–33.0)
MCHC: 33.8 g/dL (ref 32.0–36.0)
MCV: 89.9 fL (ref 80.0–100.0)
MPV: 11.3 fL (ref 7.5–12.5)
Monocytes Relative: 8.5 %
Neutro Abs: 3996 cells/uL (ref 1500–7800)
Neutrophils Relative %: 51.9 %
Platelets: 226 10*3/uL (ref 140–400)
RBC: 4.87 10*6/uL (ref 3.80–5.10)
RDW: 14.1 % (ref 11.0–15.0)
Total Lymphocyte: 37.1 %
WBC: 7.7 10*3/uL (ref 3.8–10.8)

## 2018-10-25 NOTE — Progress Notes (Signed)
CBC and CMP WNL

## 2019-02-11 ENCOUNTER — Other Ambulatory Visit: Payer: Self-pay | Admitting: Rheumatology

## 2019-02-11 NOTE — Telephone Encounter (Addendum)
Last Visit: 10/24/18  Next Visit: 03/27/19 Labs: 10/24/18 WNL  Patient advised she is due to update labs. Patient states her husband was recently released from the hospital. Patient will call when she will call when she is able to get someone to sit with her husband for her to go get the labs done.   Okay to refill 30 day supply MTX?

## 2019-02-11 NOTE — Telephone Encounter (Signed)
Ok to refill 30 day supply of MTX

## 2019-03-04 ENCOUNTER — Other Ambulatory Visit: Payer: Self-pay

## 2019-03-05 NOTE — Progress Notes (Signed)
64 y.o. [id number] Married  Caucasian Fe here for annual exam. Post menopausal no HRT due to endometrial cancer. Stressful year with spouse having infection in foot and DVT. He is stable now and she is coping. Recent fall on step forward with slight trauma on left breast and scraped knee. No serious injury.  Sees PCP Dr. Melford Aase once yearly and all stable. Having labs draw today. Continues with Dr Matthew Saras with Embrel/methotrexate  management twice monthly. No other health issues.  Patient's last menstrual period was 02/06/1982.          Sexually active: Yes.    The current method of family planning is status post hysterectomy.    Exercising: Yes.    some Smoker:  no  Review of Systems  Constitutional: Negative.   HENT: Negative.   Eyes: Negative.   Respiratory: Negative.   Cardiovascular: Negative.   Gastrointestinal: Negative.   Genitourinary: Negative.   Musculoskeletal: Negative.   Skin: Negative.   Neurological: Negative.   Endo/Heme/Allergies: Negative.   Psychiatric/Behavioral: Negative.     Health Maintenance: Pap:  03-02-17 neg (hx of endometrial cancer),03-06-2018 neg, HPV HR neg  History of Abnormal Pap: yes MMG:  04-10-2018 category b density birads 1:neg Self Breast exams: yes Colonoscopy:  2020 f/u 62yrs per patient BMD:   2015 sees orthopedic TDaP:  2014 Shingles: 2017 Pneumonia: had 1 done  Hep C and HIV: both neg 2018 Labs: no   reports that she has never smoked. She has never used smokeless tobacco. She reports previous alcohol use. She reports that she does not use drugs.  Past Medical History:  Diagnosis Date  . Abnormal Pap smear    many yrs ago  . Endometrial cancer ([place])   . Endometriosis   . Esophagus disorder   . Psoriatic arthritis ([place])   . Shingles   . Shingles     Past Surgical History:  Procedure Laterality Date  . ABDOMINAL HYSTERECTOMY     age 70  . APPENDECTOMY    . BILATERAL SALPINGOOPHORECTOMY Right 11/24/08   lysis of adhesion  .  esophageal stretched    . EXPLORATORY LAPAROTOMY    . jaw bone graft  2004  . KNEE ARTHROPLASTY    . KNEE SURGERY    . LAPAROSCOPY     age 40  . LIPOSUCTION TRUNK  04/06/2016   Dr. Lenon Curt for body image  . LIVER BIOPSY     mild steatosis    Current Outpatient Medications  Medication Sig Dispense Refill  . ASPIRIN 81 PO Take by mouth.    . Calcium Citrate-Vitamin D (CALCIUM + D PO) Take 600 mg by mouth. Takes 2    . Cyanocobalamin (B-12 PO) Take 100 mcg by mouth. Takes 2    . ENBREL SURECLICK 50 MG/ML injection INJECT 1 PEN UNDER THE SKIN EVERY 14 DAYS. (Patient taking differently: 50 mg once a week. ) 12 Syringe 0  . folic acid (FOLVITE) [id number] MCG tablet Take 400 mcg by mouth daily.    . methotrexate (RHEUMATREX) 2.5 MG tablet TAKE 7 TABLETS (17.5 MG TOTAL) BY MOUTH ONCE A WEEK. CAUTION:CHEMOTHERAPY. PROTECT FROM LIGHT. 28 tablet 0  . Multiple Vitamin (MULITIVITAMIN WITH MINERALS) TABS Take 1 tablet by mouth daily.       No current facility-administered medications for this visit.    Family History  Problem Relation Age of Onset  . Heart disease Mother   . Hypertension Mother   . Stroke Mother   . Diabetes Mother   .  Osteoporosis Mother   . Hypothyroidism Mother   . Heart disease Father   . Heart disease Brother   . Cancer Paternal Aunt        ovarian  . Colon cancer Maternal Grandmother   . Breast cancer Neg Hx     ROS:  Pertinent items are noted in HPI.  Otherwise, a comprehensive ROS was negative.  Exam:   BP 120/82   Pulse 70   Temp 97.7 F (36.5 C) (Skin)   Resp 16   Ht 5' 1.25" (1.556 m)   Wt 182 lb (82.6 kg)   LMP 02/06/1982   BMI 34.11 kg/m  Height: 5' 1.25" (155.6 cm) Ht Readings from Last 3 Encounters:  03/07/19 5' 1.25" (1.556 m)  10/24/18 5\' 2"  (1.575 m)  07/09/18 5\' 2"  (1.575 m)    General appearance: alert, cooperative and appears stated age Head: Normocephalic, without obvious abnormality, atraumatic Neck: no adenopathy, supple, symmetrical,  trachea midline and thyroid normal to inspection and palpation Lungs: clear to auscultation bilaterally Breasts: normal appearance, no masses or tenderness, No nipple retraction or dimpling, No nipple discharge or bleeding, No axillary or supraclavicular adenopathy, Normal to palpation without dominant masses Heart: regular rate and rhythm Abdomen: soft, non-tender; no masses,  no organomegaly Extremities: extremities normal, atraumatic, no cyanosis or edema Skin: Skin color, texture, turgor normal. No rashes or lesions Lymph nodes: Cervical, supraclavicular, and axillary nodes normal. No abnormal inguinal nodes palpated Neurologic: Grossly normal   Pelvic: External genitalia:  no lesions              Urethra:  normal appearing urethra with no masses, tenderness or lesions              Bartholin's and Skene's: normal                 Vagina: normal appearing vagina with normal color and discharge, no lesions              Cervix: absent              Pap taken: Yes.   Bimanual Exam:  Uterus:  uterus absent              Adnexa: no mass, fullness, tenderness               Rectovaginal: Confirms               Anus:  normal sphincter tone, no lesions  Chaperone present: yes  A:  Well Woman with normal exam  Post menopausal s/p TAH for endometrial cancer  Embrel and methotrexate medication use  Social stress with spouse illness.  P:   Reviewed health and wellness pertinent to exam  Patient aware if change if vaginal discharge or bleeding needs to advise.  Take time for self.  Pap smear: yes   counseled on breast self exam, mammography screening, osteoporosis, adequate intake of calcium and vitamin D, diet and exercise, Kegel's exercises  return annually or prn  An After Visit Summary was printed and given to the patient.

## 2019-03-06 ENCOUNTER — Other Ambulatory Visit: Payer: Self-pay | Admitting: Rheumatology

## 2019-03-06 ENCOUNTER — Ambulatory Visit: Payer: BLUE CROSS/BLUE SHIELD | Admitting: Certified Nurse Midwife

## 2019-03-06 NOTE — Telephone Encounter (Signed)
Last Visit: 10/24/18  Next Visit: 03/27/19 Labs: 10/24/18 WNL  Patient advised she is due to update labs. Patient states she will update 03/07/19.  Okay to refill 30 day supply MTX?

## 2019-03-06 NOTE — Telephone Encounter (Signed)
Ok to refill 30-day supply

## 2019-03-07 ENCOUNTER — Other Ambulatory Visit (HOSPITAL_COMMUNITY)
Admission: RE | Admit: 2019-03-07 | Discharge: 2019-03-07 | Disposition: A | Discharge status: Home / Self Care | Payer: BC Managed Care – PPO | Source: Ambulatory Visit | Attending: Certified Nurse Midwife | Admitting: Certified Nurse Midwife

## 2019-03-07 ENCOUNTER — Other Ambulatory Visit: Payer: Self-pay | Admitting: *Deleted

## 2019-03-07 ENCOUNTER — Ambulatory Visit (INDEPENDENT_AMBULATORY_CARE_PROVIDER_SITE_OTHER): Payer: BC Managed Care – PPO | Admitting: Certified Nurse Midwife

## 2019-03-07 ENCOUNTER — Encounter: Payer: Self-pay | Admitting: Certified Nurse Midwife

## 2019-03-07 ENCOUNTER — Other Ambulatory Visit: Payer: Self-pay

## 2019-03-07 VITALS — BP 120/82 | HR 70 | Temp 97.7°F | Resp 16 | Ht 61.25 in | Wt 182.0 lb

## 2019-03-07 DIAGNOSIS — Z01419 Encounter for gynecological examination (general) (routine) without abnormal findings: Secondary | ICD-10-CM

## 2019-03-07 DIAGNOSIS — Z124 Encounter for screening for malignant neoplasm of cervix: Secondary | ICD-10-CM | POA: Diagnosis present

## 2019-03-07 DIAGNOSIS — Z8542 Personal history of malignant neoplasm of other parts of uterus: Secondary | ICD-10-CM

## 2019-03-07 DIAGNOSIS — Z780 Asymptomatic menopausal state: Secondary | ICD-10-CM | POA: Diagnosis not present

## 2019-03-07 DIAGNOSIS — Z79899 Other long term (current) drug therapy: Secondary | ICD-10-CM

## 2019-03-07 LAB — CBC WITH DIFFERENTIAL/PLATELET
Absolute Monocytes: 646 {cells}/uL (ref 200–950)
Basophils Absolute: 57 {cells}/uL (ref 0–200)
Basophils Relative: 0.8 %
Eosinophils Absolute: 99 {cells}/uL (ref 15–500)
Eosinophils Relative: 1.4 %
HCT: 42.7 % (ref 35.0–45.0)
Hemoglobin: 14.6 g/dL (ref 11.7–15.5)
Lymphs Abs: 2166 {cells}/uL (ref 850–3900)
MCH: 30 pg (ref 27.0–33.0)
MCHC: 34.2 g/dL (ref 32.0–36.0)
MCV: 87.7 fL (ref 80.0–100.0)
MPV: 11.7 fL (ref 7.5–12.5)
Monocytes Relative: 9.1 %
Neutro Abs: 4132 {cells}/uL (ref 1500–7800)
Neutrophils Relative %: 58.2 %
Platelets: 193 Thousand/uL (ref 140–400)
RBC: 4.87 Million/uL (ref 3.80–5.10)
RDW: 13.2 % (ref 11.0–15.0)
Total Lymphocyte: 30.5 %
WBC: 7.1 Thousand/uL (ref 3.8–10.8)

## 2019-03-07 LAB — COMPLETE METABOLIC PANEL WITHOUT GFR
AG Ratio: 1.6 (calc) (ref 1.0–2.5)
ALT: 16 U/L (ref 6–29)
AST: 17 U/L (ref 10–35)
Albumin: 4.2 g/dL (ref 3.6–5.1)
Alkaline phosphatase (APISO): 78 U/L (ref 37–153)
BUN: 16 mg/dL (ref 7–25)
CO2: 27 mmol/L (ref 20–32)
Calcium: 9.4 mg/dL (ref 8.6–10.4)
Chloride: 104 mmol/L (ref 98–110)
Creat: 0.86 mg/dL (ref 0.50–0.99)
GFR, Est African American: 83 mL/min/1.73m2
GFR, Est Non African American: 71 mL/min/1.73m2
Globulin: 2.6 g/dL (ref 1.9–3.7)
Glucose, Bld: 78 mg/dL (ref 65–99)
Potassium: 4.3 mmol/L (ref 3.5–5.3)
Sodium: 140 mmol/L (ref 135–146)
Total Bilirubin: 0.6 mg/dL (ref 0.2–1.2)
Total Protein: 6.8 g/dL (ref 6.1–8.1)

## 2019-03-07 NOTE — Patient Instructions (Signed)

## 2019-03-10 LAB — CYTOLOGY - PAP
Comment: NEGATIVE
Diagnosis: REACTIVE
High risk HPV: NEGATIVE

## 2019-03-10 NOTE — Progress Notes (Signed)
CBC and CMP WNL

## 2019-03-17 ENCOUNTER — Telehealth: Payer: Self-pay | Admitting: Rheumatology

## 2019-03-17 MED ORDER — ENBREL SURECLICK 50 MG/ML ~~LOC~~ SOAJ: SUBCUTANEOUS | 0 refills | Status: DC

## 2019-03-17 MED ORDER — METHOTREXATE 2.5 MG PO TABS: 17.5000 mg | ORAL_TABLET | ORAL | 0 refills | Status: DC

## 2019-03-17 NOTE — Telephone Encounter (Signed)
Patient request a refill on MTX ([facility]) and Enbrel ( [facility]).

## 2019-03-17 NOTE — Telephone Encounter (Signed)
Last Visit: 10/24/2018 Next Visit: 03/27/2019 Labs: 03/07/2019 CBC and CMP WNL TB Gold: 08/01/2018 negative   Last refill of enbrel: 06/20/2018  I called patient to clarify if she is taking enbrel. Patient states she has been injecting every 14 days and then missed 2 weeks due to having shingles and missed another 2 weeks due to a sinus infection. Other than that, patient has been injecting every 14 days.   Okay to refill per Dr. Estanislado Pandy.

## 2019-03-19 ENCOUNTER — Other Ambulatory Visit: Payer: Self-pay | Admitting: Certified Nurse Midwife

## 2019-03-19 DIAGNOSIS — Z1231 Encounter for screening mammogram for malignant neoplasm of breast: Secondary | ICD-10-CM

## 2019-03-21 NOTE — Progress Notes (Deleted)
Office Visit Note  Patient: Cynthia Snyder             Date of Birth: 10-12-1955           MRN: [id number]             PCP: Chesley Noon, MD Referring: Chesley Noon, MD Visit Date: 03/27/2019 Occupation: @GUAROCC @  Subjective:  No chief complaint on file.   History of Present Illness: Cynthia Snyder is a 64 y.o. female ***   Activities of Daily Living:  Patient reports morning stiffness for *** {minute/hour:19697}.   Patient {ACTIONS;DENIES/REPORTS:21021675::"Denies"} nocturnal pain.  Difficulty dressing/grooming: {ACTIONS;DENIES/REPORTS:21021675::"Denies"} Difficulty climbing stairs: {ACTIONS;DENIES/REPORTS:21021675::"Denies"} Difficulty getting out of chair: {ACTIONS;DENIES/REPORTS:21021675::"Denies"} Difficulty using hands for taps, buttons, cutlery, and/or writing: {ACTIONS;DENIES/REPORTS:21021675::"Denies"}  No Rheumatology ROS completed.   PMFS History:  Patient Active Problem List   Diagnosis Date Noted  . 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 Pain of right hip joint 03/26/2017  . Psoriatic arthropathy ([place]) 11/07/2016  . Psoriasis 08/04/2016  . DJD (degenerative joint disease), cervical 08/04/2016  . Esophagitis 08/04/2016  . Restless leg syndrome 08/04/2016  . High risk medication use 08/04/2016  . Atrophic vaginitis 01/22/2015    Class: Chronic    Past Medical History:  Diagnosis Date  . Abnormal Pap smear    many yrs ago  . Endometrial cancer ([place])   . Endometriosis   . Esophagus disorder   . Psoriatic arthritis ([place])   . Shingles   . Shingles     Family History  Problem Relation Age of Onset  . Heart disease Mother   . Hypertension Mother   . Stroke Mother   . Diabetes Mother   . Osteoporosis Mother   . Hypothyroidism Mother   . Heart disease Father   . Heart disease Brother   . Cancer Paternal Aunt        ovarian  . Colon cancer Maternal Grandmother   . Breast cancer Neg Hx    Past Surgical  History:  Procedure Laterality Date  . ABDOMINAL HYSTERECTOMY     age 64  . APPENDECTOMY    . BILATERAL SALPINGOOPHORECTOMY Right 11/24/08   lysis of adhesion  . esophageal stretched    . EXPLORATORY LAPAROTOMY    . jaw bone graft  2004  . KNEE ARTHROPLASTY    . KNEE SURGERY    . LAPAROSCOPY     age 38  . LIPOSUCTION TRUNK  04/06/2016   Dr. Lenon Curt for body image  . LIVER BIOPSY     mild steatosis   Social History   Social History Narrative  . Not on file   Immunization History  Administered Date(s) Administered  . Influenza Inj Mdck Quad Pf 10/23/2017  . Influenza Split 10/20/2017  . Influenza, Seasonal, Injecte, Preservative Fre 10/29/2013, 12/08/2014, 11/18/2015  . Tdap 11/06/2012  . Zoster 10/01/2015     Objective: Vital Signs: LMP 02/06/1982    Physical Exam   Musculoskeletal Exam: ***  CDAI Exam: CDAI Score: -- Patient Global: --; Provider Global: -- Swollen: --; Tender: -- Joint Exam 03/27/2019   No joint exam has been documented for this visit   There is currently no information documented on the homunculus. Go to the Rheumatology activity and complete the homunculus joint exam.  Investigation: No additional findings.  Imaging: No results found.  Recent Labs: Lab Results  Component Value Date   WBC 7.1 03/07/2019   HGB 14.6 03/07/2019   PLT 193 03/07/2019  NA 140 03/07/2019   K 4.3 03/07/2019   CL 104 03/07/2019   CO2 27 03/07/2019   GLUCOSE 78 03/07/2019   BUN 16 03/07/2019   CREATININE 0.86 03/07/2019   BILITOT 0.6 03/07/2019   ALKPHOS 82 08/07/2016   AST 17 03/07/2019   ALT 16 03/07/2019   PROT 6.8 03/07/2019   ALBUMIN 4.4 08/07/2016   CALCIUM 9.4 03/07/2019   GFRAA 83 03/07/2019   QFTBGOLDPLUS NEGATIVE 08/01/2018    Speciality Comments: No specialty comments available.  Procedures:  No procedures performed Allergies: Patient has no known allergies.   Assessment / Plan:     Visit Diagnoses: No diagnosis  found.  Orders: No orders of the defined types were placed in this encounter.  No orders of the defined types were placed in this encounter.   Face-to-face time spent with patient was *** minutes. Greater than 50% of time was spent in counseling and coordination of care.  Follow-Up Instructions: No follow-ups on file.   Ofilia Neas, PA-C  Note - This record has been created using Dragon software.  Chart creation errors have been sought, but may not always  have been located. Such creation errors do not reflect on  the standard of medical care.

## 2019-03-27 ENCOUNTER — Ambulatory Visit: Payer: BC Managed Care – PPO | Admitting: Physician Assistant

## 2019-04-03 NOTE — Progress Notes (Signed)
Office Visit Note  Patient: Cynthia Snyder             Date of Birth: Nov 08, 1955           MRN: [id number]             PCP: Chesley Noon, MD Referring: Chesley Noon, MD Visit Date: 04/09/2019 Occupation: @GUAROCC @  Subjective:  Joint stiffness.   History of Present Illness: Cynthia Snyder is a 64 y.o. female with history of psoriasis and psoriatic arthritis.  She states she has been doing quite well except for some joint stiffness.  She denies any joint swelling.  She recalls her last psoriatic arthritis flare was almost 2-1/2 years ago.  She is denies any rash.  She states she has been under a lot of stress as her husband had amputation.  She is also taking care of her parents.  Activities of Daily Living:  Patient reports morning stiffness for 30 minutes.   Patient Reports nocturnal pain.  Difficulty dressing/grooming: Denies Difficulty climbing stairs: Denies Difficulty getting out of chair: Denies Difficulty using hands for taps, buttons, cutlery, and/or writing: Reports  Review of Systems  Constitutional: Positive for fatigue. Negative for night sweats, weight gain and weight loss.  HENT: Positive for mouth dryness. Negative for mouth sores, trouble swallowing, trouble swallowing and nose dryness.   Eyes: Negative for pain, redness, visual disturbance and dryness.  Respiratory: Negative for cough, shortness of breath and difficulty breathing.   Cardiovascular: Negative for chest pain, palpitations, hypertension, irregular heartbeat and swelling in legs/feet.  Gastrointestinal: Negative for blood in stool, constipation and diarrhea.  Endocrine: Negative for excessive thirst and increased urination.  Genitourinary: Negative for difficulty urinating and vaginal dryness.  Musculoskeletal: Positive for arthralgias, gait problem, joint pain, muscle weakness and morning stiffness. Negative for joint swelling, myalgias, muscle tenderness and myalgias.  Skin:  Negative for color change, rash, hair loss, skin tightness, ulcers and sensitivity to sunlight.  Allergic/Immunologic: Negative for susceptible to infections.  Neurological: Negative for dizziness, memory loss, night sweats and weakness.  Hematological: Negative for bruising/bleeding tendency and swollen glands.  Psychiatric/Behavioral: Positive for sleep disturbance. Negative for depressed mood. The patient is not nervous/anxious.     PMFS History:  Patient Active Problem List   Diagnosis Date Noted  . 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 Pain of right hip joint 03/26/2017  . Psoriatic arthropathy ([place]) 11/07/2016  . Psoriasis 08/04/2016  . DJD (degenerative joint disease), cervical 08/04/2016  . Esophagitis 08/04/2016  . Restless leg syndrome 08/04/2016  . High risk medication use 08/04/2016  . Atrophic vaginitis 01/22/2015    Class: Chronic    Past Medical History:  Diagnosis Date  . Abnormal Pap smear    many yrs ago  . Endometrial cancer ([place])   . Endometriosis   . Esophagus disorder   . Psoriatic arthritis ([place])   . Shingles   . Shingles     Family History  Problem Relation Age of Onset  . Heart disease Mother   . Hypertension Mother   . Stroke Mother   . Diabetes Mother   . Osteoporosis Mother   . Hypothyroidism Mother   . Heart disease Father   . Heart disease Brother   . Cancer Paternal Aunt        ovarian  . Colon cancer Maternal Grandmother   . Breast cancer Neg Hx    Past Surgical History:  Procedure Laterality Date  . ABDOMINAL HYSTERECTOMY  age 70  . APPENDECTOMY    . BILATERAL SALPINGOOPHORECTOMY Right 11/24/08   lysis of adhesion  . esophageal stretched    . EXPLORATORY LAPAROTOMY    . jaw bone graft  2004  . KNEE ARTHROPLASTY    . KNEE SURGERY    . LAPAROSCOPY     age 3  . LIPOSUCTION TRUNK  04/06/2016   Dr. Lenon Curt for body image  . LIVER BIOPSY     mild steatosis  . NOSE SURGERY     Social History   Social  History Narrative  . Not on file   Immunization History  Administered Date(s) Administered  . Influenza Inj Mdck Quad Pf 10/23/2017  . Influenza Split 10/20/2017  . Influenza, Seasonal, Injecte, Preservative Fre 10/29/2013, 12/08/2014, 11/18/2015  . Tdap 11/06/2012  . Zoster 10/01/2015     Objective: Vital Signs: BP 113/70 (BP Location: Left Arm, Patient Position: Sitting, Cuff Size: Normal)   Pulse 90   Resp 16   Ht 5\' 2"  (1.575 m)   Wt 187 lb 3.2 oz (84.9 kg)   LMP 02/06/1982   BMI 34.24 kg/m    Physical Exam Vitals and nursing note reviewed.  Constitutional:      Appearance: She is well-developed.  HENT:     Head: Normocephalic and atraumatic.  Eyes:     Conjunctiva/sclera: Conjunctivae normal.  Cardiovascular:     Rate and Rhythm: Normal rate and regular rhythm.     Heart sounds: Normal heart sounds.  Pulmonary:     Effort: Pulmonary effort is normal.     Breath sounds: Normal breath sounds.  Abdominal:     General: Bowel sounds are normal.     Palpations: Abdomen is soft.  Musculoskeletal:     Cervical back: Normal range of motion.  Lymphadenopathy:     Cervical: No cervical adenopathy.  Skin:    General: Skin is warm and dry.     Capillary Refill: Capillary refill takes less than 2 seconds.  Neurological:     Mental Status: She is alert and oriented to person, place, and time.  Psychiatric:        Behavior: Behavior normal.      Musculoskeletal Exam: C-spine, thoracic and lumbar spine were in good range of motion.  Shoulder joints, elbow joints with good range of motion.  She has DIP and PIP thickening but no synovitis.  Hip joints, knee joints, ankles with good range of motion.  She has some DIP and PIP thickening in her feet but no synovitis was noted.  CDAI Exam: CDAI Score: - Patient Global: -; Provider Global: - Swollen: -; Tender: - Joint Exam 04/09/2019   No joint exam has been documented for this visit   There is currently no information  documented on the homunculus. Go to the Rheumatology activity and complete the homunculus joint exam.  Investigation: No additional findings.  Imaging: No results found.  Recent Labs: Lab Results  Component Value Date   WBC 7.1 03/07/2019   HGB 14.6 03/07/2019   PLT 193 03/07/2019   NA 140 03/07/2019   K 4.3 03/07/2019   CL 104 03/07/2019   CO2 27 03/07/2019   GLUCOSE 78 03/07/2019   BUN 16 03/07/2019   CREATININE 0.86 03/07/2019   BILITOT 0.6 03/07/2019   ALKPHOS 82 08/07/2016   AST 17 03/07/2019   ALT 16 03/07/2019   PROT 6.8 03/07/2019   ALBUMIN 4.4 08/07/2016   CALCIUM 9.4 03/07/2019   GFRAA 83 03/07/2019  QFTBGOLDPLUS NEGATIVE 08/01/2018    Speciality Comments: No specialty comments available.  Procedures:  No procedures performed Allergies: Patient has no known allergies.   Assessment / Plan:     Visit Diagnoses: Psoriatic arthropathy ([place]) -patient is clinically doing well with no synovitis on examination.  She continues to have some discomfort.  I will obtain x-rays to monitor for any radiographic progression.  Her labs have been stable.  We discussed spacing Enbrel to every other week as she has been doing well on combination of Enbrel and methotrexate.  Plan: XR Hand 2 View Right, XR Hand 2 View Left, XR Foot 2 Views Right, XR Foot 2 Views Left.  The x-rays were consistent with psoriatic arthritis.  No radiographic progression was noted from the films of August 13, 2016.  Psoriasis-she has no active psoriasis on examination today.  High risk medication use - Enbrel 50 mg sq every 14 days, MTX 7 tabs po qwk (4 on Saturdays and 3 on Sundays), folic acid 2 mg po qd. her labs in January were normal.  She will continue to get labs every 3 months.  DDD (degenerative disc disease), cervical-she has fairly good range of motion without discomfort.  Muscular deconditioning-she has been trying to exercise.  She states it has been very hard for her as her husband recently  had amputation.  He is a still in the hospital recovering from recent DVT.  She is also taking care of her parents.  History of restless legs syndrome  History of esophagitis  Orders: Orders Placed This Encounter  Procedures  . XR Hand 2 View Right  . XR Hand 2 View Left  . XR Foot 2 Views Right  . XR Foot 2 Views Left   No orders of the defined types were placed in this encounter.    Follow-Up Instructions: Return in about 5 months (around 09/09/2019) for Psoriatic arthritis.   Bo Merino, MD  Note - This record has been created using Editor, commissioning.  Chart creation errors have been sought, but may not always  have been located. Such creation errors do not reflect on  the standard of medical care.

## 2019-04-09 ENCOUNTER — Encounter: Payer: Self-pay | Admitting: Physician Assistant

## 2019-04-09 ENCOUNTER — Ambulatory Visit (INDEPENDENT_AMBULATORY_CARE_PROVIDER_SITE_OTHER): Payer: BC Managed Care – PPO | Admitting: Rheumatology

## 2019-04-09 ENCOUNTER — Ambulatory Visit: Payer: Self-pay

## 2019-04-09 ENCOUNTER — Other Ambulatory Visit: Payer: Self-pay

## 2019-04-09 VITALS — BP 113/70 | HR 90 | Resp 16 | Ht 62.0 in | Wt 187.2 lb

## 2019-04-09 DIAGNOSIS — L4050 Arthropathic psoriasis, unspecified: Secondary | ICD-10-CM | POA: Diagnosis not present

## 2019-04-09 DIAGNOSIS — M5030 Other cervical disc degeneration, unspecified cervical region: Secondary | ICD-10-CM | POA: Diagnosis not present

## 2019-04-09 DIAGNOSIS — M79641 Pain in right hand: Secondary | ICD-10-CM

## 2019-04-09 DIAGNOSIS — M79642 Pain in left hand: Secondary | ICD-10-CM | POA: Diagnosis not present

## 2019-04-09 DIAGNOSIS — Z79899 Other long term (current) drug therapy: Secondary | ICD-10-CM | POA: Diagnosis not present

## 2019-04-09 DIAGNOSIS — M79671 Pain in right foot: Secondary | ICD-10-CM | POA: Diagnosis not present

## 2019-04-09 DIAGNOSIS — Z8669 Personal history of other diseases of the nervous system and sense organs: Secondary | ICD-10-CM

## 2019-04-09 DIAGNOSIS — M79672 Pain in left foot: Secondary | ICD-10-CM

## 2019-04-09 DIAGNOSIS — R29898 Other symptoms and signs involving the musculoskeletal system: Secondary | ICD-10-CM

## 2019-04-09 DIAGNOSIS — L409 Psoriasis, unspecified: Secondary | ICD-10-CM

## 2019-04-09 DIAGNOSIS — Z8719 Personal history of other diseases of the digestive system: Secondary | ICD-10-CM

## 2019-04-09 NOTE — Patient Instructions (Signed)
Standing Labs We placed an order today for your standing lab work.    Please come back and get your standing labs in April and every 3 months   We have open lab daily Monday through Thursday from 8:30-12:30 PM and 1:30-4:30 PM and Friday from 8:30-12:30 PM and 1:30-4:00 PM at the office of Dr. Ulonda Klosowski.   You may experience shorter wait times on Monday and Friday afternoons. The office is located at [address], [address], [place], [place] [address] No appointment is necessary.   Labs are drawn by [facility].  You may receive a bill from [facility] for your lab work.  If you wish to have your labs drawn at another location, please call the office 24 hours in advance to send orders.  If you have any questions regarding directions or hours of operation,  please call [phone].   Just as a reminder please drink plenty of water prior to coming for your lab work. Thanks!   

## 2019-04-14 ENCOUNTER — Other Ambulatory Visit: Payer: Self-pay | Admitting: Rheumatology

## 2019-04-17 ENCOUNTER — Other Ambulatory Visit: Payer: Self-pay | Admitting: Rheumatology

## 2019-04-25 ENCOUNTER — Ambulatory Visit
Admission: RE | Admit: 2019-04-25 | Discharge: 2019-04-25 | Disposition: A | Discharge status: Home / Self Care | Payer: BC Managed Care – PPO | Source: Ambulatory Visit | Attending: Certified Nurse Midwife | Admitting: Certified Nurse Midwife

## 2019-04-25 ENCOUNTER — Other Ambulatory Visit: Payer: Self-pay

## 2019-04-25 DIAGNOSIS — Z1231 Encounter for screening mammogram for malignant neoplasm of breast: Secondary | ICD-10-CM

## 2019-04-28 NOTE — Progress Notes (Signed)
Mammogram reviewed negative Birads 1 negative Density B Repeat yearly

## 2019-04-30 ENCOUNTER — Encounter: Payer: Self-pay | Admitting: Certified Nurse Midwife

## 2019-05-05 ENCOUNTER — Telehealth: Payer: Self-pay | Admitting: Rheumatology

## 2019-05-05 NOTE — Telephone Encounter (Signed)
Attempted to contact the patient and left message for patient to call the office.  

## 2019-05-05 NOTE — Telephone Encounter (Signed)
Patient left a voicemail stating she is due for labwork in April, but will be visiting her mom at that time.  Patient is requesting a return call to let her know if it is okay with her insurance if she comes in tomorrow 3/30 or Wednesday, 05/07/19.

## 2019-05-06 NOTE — Telephone Encounter (Signed)
Patient returned call to the office. Patient advised she is due to update labs at the end of April. Patient states she will have them done at the end of April as she will not be out of town.

## 2019-06-17 ENCOUNTER — Other Ambulatory Visit: Payer: Self-pay

## 2019-06-17 DIAGNOSIS — Z79899 Other long term (current) drug therapy: Secondary | ICD-10-CM

## 2019-06-17 LAB — COMPLETE METABOLIC PANEL WITH GFR
AG Ratio: 1.8 (calc) (ref 1.0–2.5)
ALT: 14 U/L (ref 6–29)
AST: 17 U/L (ref 10–35)
Albumin: 4.2 g/dL (ref 3.6–5.1)
Alkaline phosphatase (APISO): 81 U/L (ref 37–153)
BUN: 16 mg/dL (ref 7–25)
CO2: 26 mmol/L (ref 20–32)
Calcium: 8.9 mg/dL (ref 8.6–10.4)
Chloride: 108 mmol/L (ref 98–110)
Creat: 0.85 mg/dL (ref 0.50–0.99)
GFR, Est African American: 84 mL/min/{1.73_m2} (ref >=60)
GFR, Est Non African American: 72 mL/min/{1.73_m2} (ref >=60)
Globulin: 2.4 g/dL (calc) (ref 1.9–3.7)
Glucose, Bld: 107 mg/dL — ABNORMAL HIGH (ref 65–99)
Potassium: 4.1 mmol/L (ref 3.5–5.3)
Sodium: 142 mmol/L (ref 135–146)
Total Bilirubin: 0.4 mg/dL (ref 0.2–1.2)
Total Protein: 6.6 g/dL (ref 6.1–8.1)

## 2019-06-17 LAB — CBC WITH DIFFERENTIAL/PLATELET
Absolute Monocytes: 459 cells/uL (ref 200–950)
Basophils Absolute: 31 cells/uL (ref 0–200)
Basophils Relative: 0.5 %
Eosinophils Absolute: 149 cells/uL (ref 15–500)
Eosinophils Relative: 2.4 %
HCT: 43.6 % (ref 35.0–45.0)
Hemoglobin: 14.4 g/dL (ref 11.7–15.5)
Lymphs Abs: 1947 cells/uL (ref 850–3900)
MCH: 29.1 pg (ref 27.0–33.0)
MCHC: 33 g/dL (ref 32.0–36.0)
MCV: 88.3 fL (ref 80.0–100.0)
MPV: 10.8 fL (ref 7.5–12.5)
Monocytes Relative: 7.4 %
Neutro Abs: 3615 cells/uL (ref 1500–7800)
Neutrophils Relative %: 58.3 %
Platelets: 191 10*3/uL (ref 140–400)
RBC: 4.94 10*6/uL (ref 3.80–5.10)
RDW: 14 % (ref 11.0–15.0)
Total Lymphocyte: 31.4 %
WBC: 6.2 10*3/uL (ref 3.8–10.8)

## 2019-06-18 NOTE — Progress Notes (Signed)
Glucose is 107. Rest of CMP WNL.  CBC WNL.

## 2019-06-20 ENCOUNTER — Other Ambulatory Visit: Payer: Self-pay | Admitting: *Deleted

## 2019-06-20 ENCOUNTER — Other Ambulatory Visit: Payer: Self-pay | Admitting: Rheumatology

## 2019-06-20 DIAGNOSIS — Z9225 Personal history of immunosupression therapy: Secondary | ICD-10-CM

## 2019-06-20 DIAGNOSIS — Z111 Encounter for screening for respiratory tuberculosis: Secondary | ICD-10-CM

## 2019-06-20 MED ORDER — ENBREL SURECLICK 50 MG/ML ~~LOC~~ SOAJ: SUBCUTANEOUS | 0 refills | Status: DC

## 2019-06-20 NOTE — Telephone Encounter (Signed)
Patient contacted the office and states she needs a refill on Enbrel.   Last Visit: 04/09/2019 Next Visit: 09/09/2019 Labs: 06/17/2019 Glucose is 107. Rest of CMP WNL. CBC WNL TB Gold: 08/01/2018 Neg   Current Dose per office note 04/09/2019: Enbrel 50 mg sq every 14 days  Patient advised she will be due to update TB Gold in June 2021.   Okay to refill per Dr. Estanislado Pandy

## 2019-06-30 ENCOUNTER — Other Ambulatory Visit: Payer: Self-pay | Admitting: Rheumatology

## 2019-06-30 NOTE — Telephone Encounter (Signed)
Last Visit: 04/09/2019 Next Visit:09/09/2019 Labs: 06/17/2019  Glucose is 107. Rest of CMP WNL. CBC WNL  Okay to refill per Dr. Estanislado Pandy.

## 2019-07-01 ENCOUNTER — Telehealth: Payer: Self-pay | Admitting: Rheumatology

## 2019-07-01 NOTE — Telephone Encounter (Signed)
Spoke with [facility] and they have th prescription for the Enbrel that was sent to the pharmacy on 06/20/2019. They will reach out to patient to set up shipment. Spoke with patient and advised.

## 2019-07-01 NOTE — Telephone Encounter (Signed)
Patient called stating she requested a prescription refill of her Enbrel last week.  Patient states she called [facility] yesterday, 06/30/19 to confirm delivery and was told her prescription had expired and they were waiting for a new prescription from our office.  Patient states she is due to take her next injection on Monday, 07/07/19 and requesting a return call.

## 2019-08-05 ENCOUNTER — Other Ambulatory Visit: Payer: Self-pay | Admitting: Rheumatology

## 2019-08-05 NOTE — Telephone Encounter (Signed)
Last Visit: 04/09/2019 Next Visit: 09/09/2019 Labs: 06/17/2019 Glucose is 107. Rest of CMP WNL. CBC WNL TB Gold: 08/01/2018 Neg   Patient advised she is due to update her TB Gold. Patient states she will come in at the beginning of next week.   Current Dose per office note 04/09/2019: Enbrel 50 mg sq every 14 days  Okay to refill Enbrel?

## 2019-08-19 ENCOUNTER — Telehealth: Payer: Self-pay | Admitting: *Deleted

## 2019-08-19 NOTE — Telephone Encounter (Signed)
Received a call from [facility] and patient's prescription was sent into the pharmacy for a quantity on 2 and Enbrel comes in an unbreakable pack of 4. Per Dr. Estanislado Pandy okay to fill for a quantity of 4. Clarified with pharmacist.

## 2019-08-27 ENCOUNTER — Other Ambulatory Visit: Payer: Self-pay

## 2019-08-27 DIAGNOSIS — Z79899 Other long term (current) drug therapy: Secondary | ICD-10-CM

## 2019-08-27 DIAGNOSIS — Z9225 Personal history of immunosupression therapy: Secondary | ICD-10-CM

## 2019-08-27 DIAGNOSIS — Z111 Encounter for screening for respiratory tuberculosis: Secondary | ICD-10-CM

## 2019-08-27 NOTE — Progress Notes (Signed)
Office Visit Note  Patient: Cynthia Snyder             Date of Birth: 01/12/1956           MRN: [id number]             PCP: Chesley Noon, MD Referring: Chesley Noon, MD Visit Date: 09/09/2019 Occupation: @GUAROCC @  Subjective:  Stiffness in hands.   History of Present Illness: Cynthia Snyder is a 64 y.o. female with history of psoriatic arthritis and osteoarthritis.  She states she has been doing quite well on combination of methotrexate and Enbrel.  She has been taking Enbrel every other week and methotrexate once a week.  She denies any joint swelling.  She continues to have some stiffness in her hands and feet.  She denies any SI joint pain.  There is no history of Achilles tendinitis or plantar fasciitis.  Activities of Daily Living:  Patient reports morning stiffness for 15-30 minutes.   Patient Reports nocturnal pain.  Difficulty dressing/grooming: Denies Difficulty climbing stairs: Reports Difficulty getting out of chair: Denies Difficulty using hands for taps, buttons, cutlery, and/or writing: Denies  Review of Systems  Constitutional: Negative for fatigue.  HENT: Positive for mouth dryness and nose dryness. Negative for mouth sores.   Eyes: Negative for itching and dryness.  Respiratory: Negative for shortness of breath and difficulty breathing.   Cardiovascular: Negative for chest pain and palpitations.  Gastrointestinal: Negative for blood in stool, constipation and diarrhea.  Endocrine: Negative for increased urination.  Genitourinary: Negative for difficulty urinating.  Musculoskeletal: Positive for arthralgias, joint pain, myalgias, morning stiffness and myalgias. Negative for joint swelling and muscle tenderness.  Skin: Negative for color change, rash and redness.  Allergic/Immunologic: Negative for susceptible to infections.  Neurological: Negative for dizziness, numbness, headaches, memory loss and weakness.  Hematological: Negative for  bruising/bleeding tendency.  Psychiatric/Behavioral: Negative for confusion.    PMFS History:  Patient Active Problem List   Diagnosis Date Noted  . 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 Pain of right hip joint 03/26/2017  . Psoriatic arthropathy ([place]) 11/07/2016  . Psoriasis 08/04/2016  . DJD (degenerative joint disease), cervical 08/04/2016  . Esophagitis 08/04/2016  . Restless leg syndrome 08/04/2016  . High risk medication use 08/04/2016  . Atrophic vaginitis 01/22/2015    Class: Chronic    Past Medical History:  Diagnosis Date  . Abnormal Pap smear    many yrs ago  . Endometrial cancer ([place])   . Endometriosis   . Esophagus disorder   . Psoriatic arthritis ([place])   . Shingles   . Shingles     Family History  Problem Relation Age of Onset  . Heart disease Mother   . Hypertension Mother   . Stroke Mother   . Diabetes Mother   . Osteoporosis Mother   . Hypothyroidism Mother   . Heart disease Father   . Heart disease Brother   . Stroke Brother   . Cancer Paternal Aunt        ovarian  . Colon cancer Maternal Grandmother   . Breast cancer Neg Hx    Past Surgical History:  Procedure Laterality Date  . ABDOMINAL HYSTERECTOMY     age 48  . APPENDECTOMY    . BILATERAL SALPINGOOPHORECTOMY Right 11/24/08   lysis of adhesion  . esophageal stretched    . EXPLORATORY LAPAROTOMY    . jaw bone graft  2004  . KNEE ARTHROPLASTY    .  KNEE SURGERY    . LAPAROSCOPY     age 7  . LIPOSUCTION TRUNK  04/06/2016   Dr. Lenon Curt for body image  . LIVER BIOPSY     mild steatosis  . NOSE SURGERY     Social History   Social History Narrative  . Not on file   Immunization History  Administered Date(s) Administered  . Influenza Inj Mdck Quad Pf 10/23/2017  . Influenza Split 10/20/2017  . Influenza, Seasonal, Injecte, Preservative Fre 10/29/2013, 12/08/2014, 11/18/2015  . Tdap 11/06/2012  . Zoster 10/01/2015     Objective: Vital Signs: BP 124/82 (BP  Location: Left Arm, Patient Position: Sitting, Cuff Size: Normal)   Pulse 90   Resp 14   Ht 5\' 2"  (1.575 m)   Wt 183 lb 9.6 oz (83.3 kg)   LMP 02/06/1982   BMI 33.58 kg/m    Physical Exam Vitals and nursing note reviewed.  Constitutional:      Appearance: She is well-developed.  HENT:     Head: Normocephalic and atraumatic.  Eyes:     Conjunctiva/sclera: Conjunctivae normal.  Cardiovascular:     Rate and Rhythm: Normal rate and regular rhythm.     Heart sounds: Normal heart sounds.  Pulmonary:     Effort: Pulmonary effort is normal.     Breath sounds: Normal breath sounds.  Abdominal:     General: Bowel sounds are normal.     Palpations: Abdomen is soft.  Musculoskeletal:     Cervical back: Normal range of motion.  Lymphadenopathy:     Cervical: No cervical adenopathy.  Skin:    General: Skin is warm and dry.     Capillary Refill: Capillary refill takes less than 2 seconds.  Neurological:     Mental Status: She is alert and oriented to person, place, and time.  Psychiatric:        Behavior: Behavior normal.      Musculoskeletal Exam: C-spine and lumbar spine with good range of motion.  Shoulder joints, elbow joints, wrist joints with good range of motion.  She has bilateral DIP and PIP thickening with no synovitis.  Hip joints, knee joints and ankles with good range of motion.  She has some DIP and PIP thickening in her feet.  CDAI Exam: CDAI Score: -- Patient Global: --; Provider Global: -- Swollen: --; Tender: -- Joint Exam 09/09/2019   No joint exam has been documented for this visit   There is currently no information documented on the homunculus. Go to the Rheumatology activity and complete the homunculus joint exam.  Investigation: No additional findings.  Imaging: No results found.  Recent Labs: Lab Results  Component Value Date   WBC 5.1 08/27/2019   HGB 14.6 08/27/2019   PLT 187 08/27/2019   NA 141 08/27/2019   K 4.1 08/27/2019   CL 107  08/27/2019   CO2 26 08/27/2019   GLUCOSE 81 08/27/2019   BUN 11 08/27/2019   CREATININE 0.89 08/27/2019   BILITOT 0.6 08/27/2019   ALKPHOS 82 08/07/2016   AST 18 08/27/2019   ALT 20 08/27/2019   PROT 6.6 08/27/2019   ALBUMIN 4.4 08/07/2016   CALCIUM 9.1 08/27/2019   GFRAA 79 08/27/2019   QFTBGOLDPLUS NEGATIVE 08/27/2019    Speciality Comments: No specialty comments available.  Procedures:  No procedures performed Allergies: Patient has no known allergies.   Assessment / Plan:     Visit Diagnoses: Psoriatic arthropathy ([facility])-patient had no synovitis on examination.  Her arthritis is very  well controlled.  Psoriasis-she has no active psoriasis lesions.  High risk medication use - Enbrel 50 mg sq every 14 days, MTX 7 tabs po qwk (4 on Saturdays and 3 on Sundays), folic acid 2 mg po qd.  Her labs in July were normal.  I advised her to get labs every 3 months to monitor for drug toxicity.  DDD (degenerative disc disease), cervical-she is currently not having much discomfort.  Muscular deconditioning-she has been trying to exercise.  History of restless legs syndrome  History of esophagitis  Osteoporosis screening-patient states she had DEXA scan by her GYN in the past and she has not had a DEXA scan in a long time.  We will schedule DEXA scan.  Need for this to exercise but emphasized.  Patient had both Covid vaccines.  She states she delayed Enbrel and methotrexate for 1 week after each vaccine.  Emphasized need for wearing mask and practice social distancing and hand hygiene.  Orders: Orders Placed This Encounter  Procedures  . DG BONE DENSITY (DXA)   Meds ordered this encounter  Medications  . methotrexate (RHEUMATREX) 2.5 MG tablet    Sig: Take 7 tablets (17.5 mg total) by mouth once a week. Caution:Chemotherapy. Protect from light.    Dispense:  84 tablet    Refill:  0   .  Follow-Up Instructions: Return in about 5 months (around 02/09/2020) for Psoriatic  arthritis.   Bo Merino, MD  Note - This record has been created using Editor, commissioning.  Chart creation errors have been sought, but may not always  have been located. Such creation errors do not reflect on  the standard of medical care.

## 2019-08-28 ENCOUNTER — Other Ambulatory Visit: Payer: Self-pay | Admitting: Rheumatology

## 2019-08-28 MED ORDER — ENBREL SURECLICK 50 MG/ML ~~LOC~~ SOAJ: SUBCUTANEOUS | 0 refills | Status: DC

## 2019-08-28 NOTE — Telephone Encounter (Signed)
Last Visit:04/09/2019 Next Visit:09/09/2019 Labs:08/27/2019 CBC and CMP WNL TB Gold: 08/27/2019 (pending) 08/01/2018 Neg  Current Dose per office note3/04/2019:Enbrel 50 mg sq every 14 days  Okay to refill Enbrel?

## 2019-08-28 NOTE — Progress Notes (Signed)
CBC and CMP WNL

## 2019-08-28 NOTE — Telephone Encounter (Signed)
[facility] for 30 day supply only. Insurance will not cover anything over 30 days. Fax # [phone]

## 2019-08-30 LAB — QUANTIFERON-TB GOLD PLUS
Mitogen-NIL: >10 IU/mL
NIL: 0.05 IU/mL
QuantiFERON-TB Gold Plus: NEGATIVE
TB1-NIL: 0 IU/mL
TB2-NIL: 0 IU/mL

## 2019-08-30 LAB — COMPLETE METABOLIC PANEL WITH GFR
AG Ratio: 1.8 (calc) (ref 1.0–2.5)
ALT: 20 U/L (ref 6–29)
AST: 18 U/L (ref 10–35)
Albumin: 4.2 g/dL (ref 3.6–5.1)
Alkaline phosphatase (APISO): 67 U/L (ref 37–153)
BUN: 11 mg/dL (ref 7–25)
CO2: 26 mmol/L (ref 20–32)
Calcium: 9.1 mg/dL (ref 8.6–10.4)
Chloride: 107 mmol/L (ref 98–110)
Creat: 0.89 mg/dL (ref 0.50–0.99)
GFR, Est African American: 79 mL/min/{1.73_m2} (ref >=60)
GFR, Est Non African American: 68 mL/min/{1.73_m2} (ref >=60)
Globulin: 2.4 g/dL (calc) (ref 1.9–3.7)
Glucose, Bld: 81 mg/dL (ref 65–99)
Potassium: 4.1 mmol/L (ref 3.5–5.3)
Sodium: 141 mmol/L (ref 135–146)
Total Bilirubin: 0.6 mg/dL (ref 0.2–1.2)
Total Protein: 6.6 g/dL (ref 6.1–8.1)

## 2019-08-30 LAB — CBC WITH DIFFERENTIAL/PLATELET
Absolute Monocytes: 469 cells/uL (ref 200–950)
Basophils Absolute: 41 cells/uL (ref 0–200)
Basophils Relative: 0.8 %
Eosinophils Absolute: 158 cells/uL (ref 15–500)
Eosinophils Relative: 3.1 %
HCT: 44 % (ref 35.0–45.0)
Hemoglobin: 14.6 g/dL (ref 11.7–15.5)
Lymphs Abs: 1892 cells/uL (ref 850–3900)
MCH: 28.7 pg (ref 27.0–33.0)
MCHC: 33.2 g/dL (ref 32.0–36.0)
MCV: 86.6 fL (ref 80.0–100.0)
MPV: 11 fL (ref 7.5–12.5)
Monocytes Relative: 9.2 %
Neutro Abs: 2540 cells/uL (ref 1500–7800)
Neutrophils Relative %: 49.8 %
Platelets: 187 10*3/uL (ref 140–400)
RBC: 5.08 10*6/uL (ref 3.80–5.10)
RDW: 13.9 % (ref 11.0–15.0)
Total Lymphocyte: 37.1 %
WBC: 5.1 10*3/uL (ref 3.8–10.8)

## 2019-09-09 ENCOUNTER — Ambulatory Visit (INDEPENDENT_AMBULATORY_CARE_PROVIDER_SITE_OTHER): Payer: BC Managed Care – PPO | Admitting: Rheumatology

## 2019-09-09 ENCOUNTER — Other Ambulatory Visit: Payer: Self-pay

## 2019-09-09 ENCOUNTER — Encounter: Payer: Self-pay | Admitting: Physician Assistant

## 2019-09-09 VITALS — BP 124/82 | HR 90 | Resp 14 | Ht 62.0 in | Wt 183.6 lb

## 2019-09-09 DIAGNOSIS — L409 Psoriasis, unspecified: Secondary | ICD-10-CM

## 2019-09-09 DIAGNOSIS — Z8719 Personal history of other diseases of the digestive system: Secondary | ICD-10-CM

## 2019-09-09 DIAGNOSIS — M5030 Other cervical disc degeneration, unspecified cervical region: Secondary | ICD-10-CM

## 2019-09-09 DIAGNOSIS — Z8669 Personal history of other diseases of the nervous system and sense organs: Secondary | ICD-10-CM

## 2019-09-09 DIAGNOSIS — R29898 Other symptoms and signs involving the musculoskeletal system: Secondary | ICD-10-CM

## 2019-09-09 DIAGNOSIS — L4050 Arthropathic psoriasis, unspecified: Secondary | ICD-10-CM | POA: Diagnosis not present

## 2019-09-09 DIAGNOSIS — Z79899 Other long term (current) drug therapy: Secondary | ICD-10-CM | POA: Diagnosis not present

## 2019-09-09 DIAGNOSIS — Z13820 Encounter for screening for osteoporosis: Secondary | ICD-10-CM

## 2019-09-09 MED ORDER — METHOTREXATE 2.5 MG PO TABS: 17.5000 mg | ORAL_TABLET | ORAL | 0 refills | Status: DC

## 2019-09-09 NOTE — Patient Instructions (Signed)

## 2019-10-01 ENCOUNTER — Telehealth: Payer: Self-pay | Admitting: Rheumatology

## 2019-10-01 NOTE — Telephone Encounter (Signed)
Patient is enrolling in Medicare and is concerned she will not be able to afford Enbrel any longer.  She states she has tried patient assistance in the past but was denied due to income.  She is looking into enrolling in Medicare plus supplement.  Advised that in office Cimzia and Simponi/Remicade/Orencia infusions would be covered under her supplement.  She is interested in in-office Cimzia.    She is currently on Enbrel 50 mg every 14 days along with methotrexate 17.5 mg weekly and think infusion may be too aggressive of therapy.  Please advise on if okay to proceed with Cimzia or if we can step down her therapy as her psoriatic arthritis has been well-controlled for almost 3 years.

## 2019-10-01 NOTE — Telephone Encounter (Signed)
Patient called stating she was returning Amber's call.

## 2019-10-01 NOTE — Telephone Encounter (Signed)
Patient called back stating she found 3 lower priced medications that are similar to Enbrel:  Gilda Crease, and [employer].  Patient states she is planning to meet with her insurance provider tomorrow 10/02/19 at 9:00 am and is hoping to discuss these medications with you before that appointment.

## 2019-10-01 NOTE — Telephone Encounter (Signed)
We can try stopping Enbrel.  If she develops increased pain and symptoms then we can apply for Cimzia.

## 2019-10-01 NOTE — Telephone Encounter (Signed)
Patient calling to discuss Enbrel, and going on Medicare. Medicare rep told patient she would not be covered for that medication unless she is given injections in office. Please call to discuss.

## 2019-10-01 NOTE — Telephone Encounter (Signed)
Called to discuss with patient but no answer.  Left voicemail requesting a return call.   Mariella Saa, PharmD, [place], CPP Clinical Specialty Pharmacist (Rheumatology and Pulmonology)  10/01/2019 1:15 PM

## 2019-10-02 NOTE — Telephone Encounter (Signed)
Patient returned call with questions about other biologic medications Gilda Crease, and [employer].  Advised that Orson Ape is not approved for PsA only PP and would not be a suitable alternative and that Johny Shock is not available at this time.  Donnetta Hail could be an alternative but all three options would be poorly covered through pharmacy benefit and not able to be approved through medical benefit.  Patient verbalized understanding.  Reviewed recommendation to do a trial off Enbrel as she has been well-controlled.  If symptoms return we can consider in-office Cimzia.  Patient verbalized understanding and is in agreement with plan.  All questions encouraged and answered.  Instructed patient to call with any questions or concerns.  Mariella Saa, PharmD, [place], CPP Clinical Specialty Pharmacist (Rheumatology and Pulmonology)  10/02/2019 8:34 AM

## 2019-10-02 NOTE — Telephone Encounter (Signed)
Addressed in another encounter.   Mariella Saa, PharmD, [place], CPP Clinical Specialty Pharmacist (Rheumatology and Pulmonology)  10/02/2019 8:29 AM

## 2019-12-04 ENCOUNTER — Telehealth: Payer: Self-pay

## 2019-12-04 NOTE — Telephone Encounter (Signed)
Spoke with patient and scheduled an appointment on 12/08/2019 at 8:40 am to discuss Cimzia.

## 2019-12-04 NOTE — Telephone Encounter (Signed)
Patient called stating at her last appointment in August she was told to stop the Enbrel in preparation of starting her new medication Cimzia.  Patient states she is starting to feel the effects of not taking Enbrel especially pain in her fingers.  Patient is requesting a return call.

## 2019-12-05 NOTE — Progress Notes (Signed)
Office Visit Note  Patient: Cynthia Snyder             Date of Birth: 1955-07-15           MRN: [id number]             PCP: Chesley Noon, MD Referring: Chesley Noon, MD Visit Date: 12/08/2019 Occupation: @GUAROCC @  Subjective:  Discuss cimzia   History of Present Illness: Cynthia Snyder is a 64 y.o. female with history of psoriatic arthritis and DDD. She discontinued Enbrel in August 2021 and has continued taking Methotrexate 7 tablets by mouth once weekly.  She would like to discuss starting on in-office cimzia, which is preferred by her insurance.  She has been experiencing increased stiffness and discomfort in both hands, both knee joints, and the left ankle joint. She has also noticed increased right SI joint pain and stiffness in the C-spine.  She has been taking tylenol arthritis as needed for pain relief.   She reports had cataract surgery on the left eye 4 weeks ago followed by the right eye 2 weeks ago.  She held MTX for 1 week at the time of surgery x2.     Activities of Daily Living:  Patient reports morning stiffness for 45-60  minutes.   Patient Reports nocturnal pain.  Difficulty dressing/grooming: Denies Difficulty climbing stairs: Reports Difficulty getting out of chair: Denies Difficulty using hands for taps, buttons, cutlery, and/or writing: Denies  Review of Systems  Constitutional: Negative for fatigue.  HENT: Positive for mouth dryness. Negative for mouth sores and nose dryness.   Eyes: Positive for dryness. Negative for pain and visual disturbance.  Respiratory: Negative for cough, hemoptysis, shortness of breath and difficulty breathing.   Cardiovascular: Negative for chest pain, palpitations, hypertension and swelling in legs/feet.  Gastrointestinal: Negative for blood in stool, constipation and diarrhea.  Endocrine: Negative for increased urination.  Genitourinary: Negative for painful urination.  Musculoskeletal: Positive for  arthralgias, joint pain, joint swelling and morning stiffness. Negative for myalgias, muscle weakness, muscle tenderness and myalgias.  Skin: Negative for color change, pallor, rash, hair loss, nodules/bumps, skin tightness, ulcers and sensitivity to sunlight.  Allergic/Immunologic: Negative for susceptible to infections.  Neurological: Positive for dizziness. Negative for numbness, headaches and weakness.  Hematological: Negative for swollen glands.  Psychiatric/Behavioral: Positive for sleep disturbance. Negative for depressed mood. The patient is not nervous/anxious.     PMFS History:  Patient Active Problem List   Diagnosis Date Noted  . 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 Pain of right hip joint 03/26/2017  . Psoriatic arthropathy ([place]) 11/07/2016  . Psoriasis 08/04/2016  . DJD (degenerative joint disease), cervical 08/04/2016  . Esophagitis 08/04/2016  . Restless leg syndrome 08/04/2016  . High risk medication use 08/04/2016  . Atrophic vaginitis 01/22/2015    Class: Chronic    Past Medical History:  Diagnosis Date  . Abnormal Pap smear    many yrs ago  . Endometrial cancer ([place])   . Endometriosis   . Esophagus disorder   . Psoriatic arthritis ([place])   . Shingles   . Shingles     Family History  Problem Relation Age of Onset  . Heart disease Mother   . Hypertension Mother   . Stroke Mother   . Diabetes Mother   . Osteoporosis Mother   . Hypothyroidism Mother   . Heart disease Father   . Heart disease Brother   . Stroke Brother   . Cancer  Paternal Aunt        ovarian  . Colon cancer Maternal Grandmother   . Breast cancer Neg Hx    Past Surgical History:  Procedure Laterality Date  . ABDOMINAL HYSTERECTOMY     age 51  . APPENDECTOMY    . BILATERAL SALPINGOOPHORECTOMY Right 11/24/08   lysis of adhesion  . CATARACT EXTRACTION, BILATERAL    . esophageal stretched    . EXPLORATORY LAPAROTOMY    . jaw bone graft  2004  . KNEE ARTHROPLASTY     . KNEE SURGERY    . LAPAROSCOPY     age 35  . LIPOSUCTION TRUNK  04/06/2016   Dr. Lenon Curt for body image  . LIVER BIOPSY     mild steatosis  . NOSE SURGERY     Social History   Social History Narrative  . Not on file   Immunization History  Administered Date(s) Administered  . Influenza Inj Mdck Quad Pf 10/23/2017  . Influenza Split 10/20/2017  . Influenza, Seasonal, Injecte, Preservative Fre 10/29/2013, 12/08/2014, 11/18/2015  . Moderna SARS-COVID-2 Vaccination 04/28/2019, 05/29/2019  . Tdap 11/06/2012  . Zoster 10/01/2015     Objective: Vital Signs: BP 118/82 (BP Location: Left Arm, Patient Position: Sitting, Cuff Size: Normal)   Pulse 78   Resp 14   Ht 5\' 2"  (1.575 m)   Wt 183 lb 6.4 oz (83.2 kg)   LMP 02/06/1982   BMI 33.54 kg/m    Physical Exam Vitals and nursing note reviewed.  Constitutional:      Appearance: She is well-developed.  HENT:     Head: Normocephalic and atraumatic.  Eyes:     Conjunctiva/sclera: Conjunctivae normal.  Pulmonary:     Effort: Pulmonary effort is normal.  Abdominal:     Palpations: Abdomen is soft.  Musculoskeletal:     Cervical back: Normal range of motion.  Skin:    General: Skin is warm and dry.     Capillary Refill: Capillary refill takes less than 2 seconds.  Neurological:     Mental Status: She is alert and oriented to person, place, and time.  Psychiatric:        Behavior: Behavior normal.      Musculoskeletal Exam:  C-spine limited ROM with lateral rotation. thoracic and lumbar spine good ROM. Tenderness over the right SI joint.  No midline spinal tenderness.    Shoulder joints, elbow joints, and wrist joints good ROM with no discomfort or joint tenderness.  Incomplete fist formation bilaterally.  PIP and DIP thickening consistent with osteoarthritis of both hands.  Hip joints good ROM with no discomfort.  No tenderness to palpation over the trochanteric bursa.  Knee joints good ROM with no warmth or effusion.   Tenderness over the left ankle joint.  No achilles tendonitis or plantar fasciitis.    CDAI Exam: CDAI Score: -- Patient Global: --; Provider Global: -- Swollen: 0 ; Tender: 2  Joint Exam 12/08/2019      Right  Left  Sacroiliac   Tender     Ankle      Tender     Investigation: No additional findings.  Imaging: No results found.  Recent Labs: Lab Results  Component Value Date   WBC 5.1 08/27/2019   HGB 14.6 08/27/2019   PLT 187 08/27/2019   NA 141 08/27/2019   K 4.1 08/27/2019   CL 107 08/27/2019   CO2 26 08/27/2019   GLUCOSE 81 08/27/2019   BUN 11 08/27/2019   CREATININE 0.89  08/27/2019   BILITOT 0.6 08/27/2019   ALKPHOS 82 08/07/2016   AST 18 08/27/2019   ALT 20 08/27/2019   PROT 6.6 08/27/2019   ALBUMIN 4.4 08/07/2016   CALCIUM 9.1 08/27/2019   GFRAA 79 08/27/2019   QFTBGOLDPLUS NEGATIVE 08/27/2019    Speciality Comments: No specialty comments available.  Procedures:  No procedures performed Allergies: Patient has no known allergies.   Assessment / Plan:     Visit Diagnoses: Psoriatic arthropathy ([place]): She has no synovitis or dactylitis on exam.  She has been experiencing increased pain and stiffness in both hands and both knee joints over the past several months.  She has PIP and DIP thickening consistent with osteoarthritis of both hands.  She has incomplete fist formation bilaterally.  She has good range of motion of both knee joints with no warmth or effusion on exam today.  She has tenderness over the right SI joint.  No Achilles tendinitis or plantar fasciitis.  She is currently on methotrexate 7 tablets by mouth once weekly and folic acid 2 mg by mouth daily.  She discontinued Enbrel in August 2021 due to enrolling in Medicare.  Different treatment options were discussed in detail with and preop the clinical pharmacist on 10/02/2019.  Indications, contraindications, potential side effects of Cimzia were discussed today.  We will plan for in-office Cimzia,  which is preferred by her insurance and once approved she will return to the office for administration of the first injection.  All questions were addressed and consent was obtained today.  She will continue on methotrexate as prescribed.  She was advised to notify us if she develops increased joint pain or joint swelling.  She will follow-up in the office in the office in 8 weeks.   Psoriasis: She has 1 small patch of psoriasis on her left lower extremity.    Counseled patient that Cimzia is a TNF blocking agent.  Counseled patient on purpose, proper use, and adverse effects of Cimzia.  Reviewed the most common adverse effects including infections, headache, and injection site reactions. Discussed that there is the possibility of an increased risk of malignancy but it is not well understood if this increased risk is due to the medication or the disease state.  Advised patient to get yearly dermatology exams due to risk of skin cancer.  Counseled patient that Cimzia should be held prior to scheduled surgery.  Counseled patient to avoid live vaccines while on Cimzia.  Recommend annual influenza, Pneumovax 23, Prevnar 13, and Shingrix as indicated.   Reviewed the importance of regular labs while on Cimzia therapy. Standing orders placed. Provided patient with medication education material and answered all questions.  Patient voiced understanding.  Patient consented to Cimzia.  Will upload consent into the media tab.  Reviewed storage instructions for Cimzia.  Will apply for Cimzia through patient's insurance.  Advised initial injection must be administered in office.    Dose will be Cimzia 400 mg at weeks 0,2,4 then Cimzia 400 mg every 28 days  Prescription pending labs and insurance approval.  High risk medication use - Applying for in-office cimzia. She will continue taking Methotrexate 7 tablets by mouth once weekly (4 on saturdays and 3 tablets on sundays).  D/c Enbrel-august 2021 due to enrolling in  medicare.  . CBC and CMP WNL 08/27/19.  Orders for CBC and CMP were released.  She will be due to update lab work in 1 month then every 3 months. Standing orders for CBC and CMP are  in place.  She has had all necessary baseline immunosuppressive lab work on 08/07/16. TB gold negative on 08/27/19 and will continue to be monitored yearly.  She has not had any recent infections.  She has received both covid-19 vaccine doses. She was given a handout of information about the covid-19 vaccine booster.  DDD (degenerative disc disease), cervical: She has limited ROM with lateral rotation.  No symptoms of radiculopathy.  Other medical conditions are listed as follows:   History of restless legs syndrome  History of esophagitis  Orders: Orders Placed This Encounter  Procedures  . CBC with Differential/Platelet  . COMPLETE METABOLIC PANEL WITH GFR   No orders of the defined types were placed in this encounter.    Follow-Up Instructions: Return in about 8 weeks (around 02/02/2020) for Psoriatic arthritis, DDD.   Ofilia Neas, PA-C  Note - This record has been created using Dragon software.  Chart creation errors have been sought, but may not always  have been located. Such creation errors do not reflect on  the standard of medical care.

## 2019-12-08 ENCOUNTER — Other Ambulatory Visit: Payer: Self-pay

## 2019-12-08 ENCOUNTER — Ambulatory Visit (INDEPENDENT_AMBULATORY_CARE_PROVIDER_SITE_OTHER): Payer: BC Managed Care – PPO | Admitting: Physician Assistant

## 2019-12-08 ENCOUNTER — Encounter: Payer: Self-pay | Admitting: Physician Assistant

## 2019-12-08 ENCOUNTER — Telehealth: Payer: Self-pay

## 2019-12-08 VITALS — BP 118/82 | HR 78 | Resp 14 | Ht 62.0 in | Wt 183.4 lb

## 2019-12-08 DIAGNOSIS — Z79899 Other long term (current) drug therapy: Secondary | ICD-10-CM | POA: Diagnosis not present

## 2019-12-08 DIAGNOSIS — Z8669 Personal history of other diseases of the nervous system and sense organs: Secondary | ICD-10-CM

## 2019-12-08 DIAGNOSIS — R29898 Other symptoms and signs involving the musculoskeletal system: Secondary | ICD-10-CM

## 2019-12-08 DIAGNOSIS — M5030 Other cervical disc degeneration, unspecified cervical region: Secondary | ICD-10-CM

## 2019-12-08 DIAGNOSIS — L409 Psoriasis, unspecified: Secondary | ICD-10-CM | POA: Diagnosis not present

## 2019-12-08 DIAGNOSIS — Z8719 Personal history of other diseases of the digestive system: Secondary | ICD-10-CM

## 2019-12-08 DIAGNOSIS — L4050 Arthropathic psoriasis, unspecified: Secondary | ICD-10-CM

## 2019-12-08 NOTE — Telephone Encounter (Signed)
Please apply for in-office Cimzia, per Hazel Sams, PA-C. Thanks!   Consent obtained and sent to the scan center.

## 2019-12-08 NOTE — Telephone Encounter (Signed)
Retiming message for when patient's Medicare will go active. Likely on her birthday. Will run eligibility check then.

## 2019-12-08 NOTE — Patient Instructions (Addendum)
Standing Labs We placed an order today for your standing lab work.   Please have your standing labs drawn in 1 month and then every 3 months   If possible, please have your labs drawn 2 weeks prior to your appointment so that the provider can discuss your results at your appointment.  We have open lab daily Monday through Thursday from 8:30-12:30 PM and 1:30-4:30 PM and Friday from 8:30-12:30 PM and 1:30-4:00 PM at the office of Dr. Bo Merino, [facility].   Please be advised, patients with office appointments requiring lab work will take precedents over walk-in lab work.  If possible, please come for your lab work on Monday and Friday afternoons, as you may experience shorter wait times. The office is located at [address], [place], [place], [place] [address] No appointment is necessary.   Labs are drawn by [facility]. Please bring your co-pay at the time of your lab draw.  You may receive a bill from [facility] for your lab work.  If you wish to have your labs drawn at another location, please call the office 24 hours in advance to send orders.  If you have any questions regarding directions or hours of operation,  please call [phone].   As a reminder, please drink plenty of water prior to coming for your lab work. Thanks!    Certolizumab pegol injection What is this medicine? CERTOLIZUMAB (SER toe LIZ oo mab) is used to treat rheumatoid arthritis, psoriatic arthritis, ankylosing spondylitis, non-radiographic axial spondyloarthritis, Crohn's disease, and plaque psoriasis. This medicine may be used for other purposes; ask your health care provider or pharmacist if you have questions. COMMON BRAND NAME(S): Cimzia What should I tell my health care provider before I take this medicine? They need to know if you have any of these conditions:  cancer  diabetes  Guillian-Barre syndrome  heart failure  hepatitis B or history of hepatitis B infection  immune  system problems  infection or history of infections  low blood counts, like low white cell, platelet, or red cell counts  multiple sclerosis  recently received or scheduled to receive a vaccine  tuberculosis, a positive skin test for tuberculosis or have recently been in close contact with someone who has tuberculosis  an unusual or allergic reaction to certolizumab, other medicines, latex, rubber, foods, dyes, or preservatives  pregnant or trying to get pregnant  breast-feeding How should I use this medicine? This medicine is for injection under the skin. It is usually given by a health care professional in a hospital or clinic setting. If you get this medicine at home, you will be taught how to prepare and give this medicine. Use exactly as directed. Take your medicine at regular intervals. Do not take your medicine more often than directed. It is important that you put your used needles and syringes in a special sharps container. Do not put them in a trash can. If you do not have a sharps container, call your pharmacist or healthcare provider to get one. A special MedGuide will be given to you by the pharmacist with each prescription and refill. Be sure to read this information carefully each time. Talk to your pediatrician regarding the use of this medicine in children. Special care may be needed. Overdosage: If you think you have taken too much of this medicine contact a poison control center or emergency room at once. NOTE: This medicine is only for you. Do not share this medicine with others. What if I  miss a dose? It is important not to miss your dose. Call your doctor or health care professional if you are unable to keep an appointment. If you give your medicine by injection under the skin: If you miss a dose, take it as soon as you can. If it is almost time for your next dose, take only that dose. Do not take double or extra doses. Call your doctor or health care professional if  you are not sure how to handle a missed dose. What may interact with this medicine? Do not take this medicine with any of the following medications:  biologic medicines such as abatacept, adalimumab, anakinra, etanercept, golimumab, infliximab, natalizumab, rituximab, secukinumab, tocilizumab, ustekinumab  live vaccines  tofacitinib This list may not describe all possible interactions. Give your health care provider a list of all the medicines, herbs, non-prescription drugs, or dietary supplements you use. Also tell them if you smoke, drink alcohol, or use illegal drugs. Some items may interact with your medicine. What should I watch for while using this medicine? Visit your doctor or health care professional for regular checks on your progress. Tell your doctor or healthcare professional if your symptoms do not start to get better or if they get worse. Your condition will be monitored carefully while you are receiving this medicine. You will be tested for tuberculosis (TB) before you start this medicine. If your doctor prescribes any medicine for TB, you should start taking the TB medicine before starting this medicine. Make sure to finish the full course of TB medicine. Call your doctor or health care professional for advice if you get a fever, chills, sore throat, or other symptoms of an infection. Do not treat yourself. This medicine may decrease your body's ability to fight infection. Try to avoid being around people who are sick. Talk to your doctor about your risk of cancer. You may be more at risk for certain types of cancers if you take this medicine. What side effects may I notice from receiving this medicine? Side effects that you should report to your doctor or health care professional as soon as possible:  allergic reactions like skin rash, itching or hives, swelling of the face, lips, or tongue  breathing problems  changes in vision  chest pain  joint or muscle pain  mouth  sores  numbness or tingling in any part of your body  signs and symptoms of infection like fever or chills; cough; sore throat; pain or trouble passing urine  signs and symptoms of liver injury like dark yellow or brown urine; general ill feeling or flu-like symptoms; light-colored stools; loss of appetite; nausea; right upper belly pain; unusually weak or tired; yellowing of the eyes or skin  swelling of the legs or ankles  swollen lymph nodes in the neck, underarm, or groin areas  unexplained weight loss  unusual bleeding or bruising  unusually weak or tired Side effects that usually do not require medical attention (report to your doctor or health care professional if they continue or are bothersome):  irritation at site where injected This list may not describe all possible side effects. Call your doctor for medical advice about side effects. You may report side effects to FDA at 1-800-FDA-1088. Where should I keep my medicine? Keep out of the reach of children. If you are using this medicine at home, keep the syringes in the refrigerator between 2 to 8 degrees C (36 to 46 degrees F). Do not freeze. Protect from light. Keep this  medicine in the original container. Throw away any unused medicine after the expiration date on the label. NOTE: This sheet is a summary. It may not cover all possible information. If you have questions about this medicine, talk to your doctor, pharmacist, or health care provider.  2020 Elsevier/Gold Standard (2017-05-08 16:35:32)   COVID-19 vaccine recommendations:   COVID-19 vaccine is recommended for everyone (unless you are allergic to a vaccine component), even if you are on a medication that suppresses your immune system.   If you are on Methotrexate, Cellcept (mycophenolate), Rinvoq, Morrie Sheldon, and Olumiant- hold the medication for 1 week after each vaccine. Hold Methotrexate for 2 weeks after the single dose COVID-19 vaccine.   If you are on  Orencia subcutaneous injection - hold medication one week prior to and one week after the first COVID-19 vaccine dose (only).   If you are on Orencia IV infusions- time vaccination administration so that the first COVID-19 vaccination will occur four weeks after the infusion and postpone the subsequent infusion by one week.   If you are on Cyclophosphamide or Rituxan infusions please contact your doctor prior to receiving the COVID-19 vaccine.   Do not take Tylenol or any anti-inflammatory medications (NSAIDs) 24 hours prior to the COVID-19 vaccination.   There is no direct evidence about the efficacy of the COVID-19 vaccine in individuals who are on medications that suppress the immune system.   Even if you are fully vaccinated, and you are on any medications that suppress your immune system, please continue to wear a mask, maintain at least six feet social distance and practice hand hygiene.   If you develop a COVID-19 infection, please contact your PCP or our office to determine if you need monoclonal antibody infusion.  The booster vaccine is now available for immunocompromised patients.   Please see the following web sites for updated information.   [email or web address]

## 2019-12-08 NOTE — Telephone Encounter (Signed)
Submitted for insurance review through [employer] for [employer]

## 2019-12-09 LAB — COMPLETE METABOLIC PANEL WITH GFR
AG Ratio: 1.8 (calc) (ref 1.0–2.5)
ALT: 17 U/L (ref 6–29)
AST: 15 U/L (ref 10–35)
Albumin: 4.5 g/dL (ref 3.6–5.1)
Alkaline phosphatase (APISO): 78 U/L (ref 37–153)
BUN: 14 mg/dL (ref 7–25)
CO2: 29 mmol/L (ref 20–32)
Calcium: 9.7 mg/dL (ref 8.6–10.4)
Chloride: 105 mmol/L (ref 98–110)
Creat: 0.8 mg/dL (ref 0.50–0.99)
GFR, Est African American: 90 mL/min/{1.73_m2} (ref >=60)
GFR, Est Non African American: 78 mL/min/{1.73_m2} (ref >=60)
Globulin: 2.5 g/dL (calc) (ref 1.9–3.7)
Glucose, Bld: 87 mg/dL (ref 65–99)
Potassium: 4 mmol/L (ref 3.5–5.3)
Sodium: 141 mmol/L (ref 135–146)
Total Bilirubin: 0.6 mg/dL (ref 0.2–1.2)
Total Protein: 7 g/dL (ref 6.1–8.1)

## 2019-12-09 LAB — CBC WITH DIFFERENTIAL/PLATELET
Absolute Monocytes: 728 cells/uL (ref 200–950)
Basophils Absolute: 53 cells/uL (ref 0–200)
Basophils Relative: 0.7 %
Eosinophils Absolute: 120 cells/uL (ref 15–500)
Eosinophils Relative: 1.6 %
HCT: 44 % (ref 35.0–45.0)
Hemoglobin: 14.7 g/dL (ref 11.7–15.5)
Lymphs Abs: 2115 cells/uL (ref 850–3900)
MCH: 28.9 pg (ref 27.0–33.0)
MCHC: 33.4 g/dL (ref 32.0–36.0)
MCV: 86.4 fL (ref 80.0–100.0)
MPV: 11.7 fL (ref 7.5–12.5)
Monocytes Relative: 9.7 %
Neutro Abs: 4485 cells/uL (ref 1500–7800)
Neutrophils Relative %: 59.8 %
Platelets: 217 10*3/uL (ref 140–400)
RBC: 5.09 10*6/uL (ref 3.80–5.10)
RDW: 13.8 % (ref 11.0–15.0)
Total Lymphocyte: 28.2 %
WBC: 7.5 10*3/uL (ref 3.8–10.8)

## 2019-12-09 NOTE — Progress Notes (Signed)
CBC and CMP WNL

## 2019-12-10 NOTE — Telephone Encounter (Signed)
Received BIV from [employer]. BIV showed that Cimzia would be covered 100% if PA is approved by [employer]. [employer] is unable to initiate PA.  Submitted a Prior Authorization request to [facility] for [facility] in office- [phone] via Fax. Will update once we receive a response.  Fax# [phone]

## 2019-12-12 NOTE — Telephone Encounter (Signed)
Received fax from [facility] for additional information. Completed and faxed. Awaiting response.

## 2019-12-15 ENCOUNTER — Telehealth: Payer: Self-pay | Admitting: Rheumatology

## 2019-12-15 NOTE — Telephone Encounter (Signed)
Patient calling in reference to medication. Patient stopped Enbrel, and wants to know about starting new medication. Patient getting worse than before starting Enbrel. Please call to discuss.

## 2019-12-15 NOTE — Telephone Encounter (Signed)
Patient advised we are awaiting a response from the insurance company. Patient advised we would reach out to her as soon as we know something.

## 2019-12-16 NOTE — Telephone Encounter (Signed)
Received notification from [facility] regarding a prior authorization for [facility] In office- [phone]. Quantity- 6000 units. Authorization has been APPROVED from 12/10/19 to 12/08/20.   Authorization # [id number] Phone # [phone]  Patient is eligible to use a Cimzia copay card, sent approval letter to [employer] to advise.   Benefits will need to be re-reviewed when patient's Medicare becomes active. Called patient left message.

## 2019-12-16 NOTE — Telephone Encounter (Signed)
Spoke to patient and discussed insurance. She states Medicare will go active December 1st 2021, but her supplement will not be active until 02/07/2020. Patient states her BCBS should stay active until 02/07/20 when her supplement becomes active.  Will review benefits after 02/07/20.

## 2019-12-16 NOTE — Telephone Encounter (Signed)
Contacted patient and scheduled her new start to [place] visit. Patient scheduled for 12/22/2019 at 9:30 am.

## 2019-12-22 ENCOUNTER — Ambulatory Visit (INDEPENDENT_AMBULATORY_CARE_PROVIDER_SITE_OTHER): Payer: BC Managed Care – PPO | Admitting: *Deleted

## 2019-12-22 ENCOUNTER — Other Ambulatory Visit: Payer: Self-pay

## 2019-12-22 VITALS — BP 133/89 | HR 82

## 2019-12-22 DIAGNOSIS — L4050 Arthropathic psoriasis, unspecified: Secondary | ICD-10-CM

## 2019-12-22 DIAGNOSIS — M0579 Rheumatoid arthritis with rheumatoid factor of multiple sites without organ or systems involvement: Secondary | ICD-10-CM

## 2019-12-22 MED ORDER — CERTOLIZUMAB PEGOL 2 X 200 MG ~~LOC~~ KIT
400.0000 mg | PACK | Freq: Once | SUBCUTANEOUS | Status: AC
  Administered 2019-12-22: 400 mg via SUBCUTANEOUS

## 2019-12-22 NOTE — Patient Instructions (Signed)
Standing Labs We placed an order today for your standing lab work.   Please have your standing labs drawn in 1 month.   If possible, please have your labs drawn 2 weeks prior to your appointment so that the provider can discuss your results at your appointment.  We have open lab daily Monday through Thursday from 8:30-12:30 PM and 1:30-4:30 PM and Friday from 8:30-12:30 PM and 1:30-4:00 PM at the office of Dr. Bo Merino, [facility].   Please be advised, patients with office appointments requiring lab work will take precedents over walk-in lab work.  If possible, please come for your lab work on Monday and Friday afternoons, as you may experience shorter wait times. The office is located at [address] [place], [place], [place] [address] No appointment is necessary.   Labs are drawn by [facility]. Please bring your co-pay at the time of your lab draw.  You may receive a bill from [facility] for your lab work.  If you wish to have your labs drawn at another location, please call the office 24 hours in advance to send orders.  If you have any questions regarding directions or hours of operation,  please call [phone].   As a reminder, please drink plenty of water prior to coming for your lab work. Thanks!

## 2019-12-22 NOTE — Progress Notes (Signed)
Pharmacy Note  Subjective:   Patient presents to clinic today to receive monthly dose of Cimzia.  Patient running a fever or have signs/symptoms of infection? No  Patient currently on antibiotics for the treatment of infection? No  Patient have any upcoming invasive procedures/surgeries? No  Objective: CMP     Component Value Date/Time   NA 141 12/08/2019 0918   K 4.0 12/08/2019 0918   CL 105 12/08/2019 0918   CO2 29 12/08/2019 0918   GLUCOSE 87 12/08/2019 0918   BUN 14 12/08/2019 0918   CREATININE 0.80 12/08/2019 0918   CALCIUM 9.7 12/08/2019 0918   PROT 7.0 12/08/2019 0918   ALBUMIN 4.4 08/07/2016 0911   AST 15 12/08/2019 0918   ALT 17 12/08/2019 0918   ALKPHOS 82 08/07/2016 0911   BILITOT 0.6 12/08/2019 0918   GFRNONAA 78 12/08/2019 0918   GFRAA 90 12/08/2019 0918    CBC    Component Value Date/Time   WBC 7.5 12/08/2019 0918   RBC 5.09 12/08/2019 0918   HGB 14.7 12/08/2019 0918   HGB 14.4 11/25/2013 0943   HCT 44.0 12/08/2019 0918   PLT 217 12/08/2019 0918   MCV 86.4 12/08/2019 0918   MCH 28.9 12/08/2019 0918   MCHC 33.4 12/08/2019 0918   RDW 13.8 12/08/2019 0918   LYMPHSABS 2,115 12/08/2019 0918   MONOABS 560 08/07/2016 0911   EOSABS 120 12/08/2019 0918   BASOSABS 53 12/08/2019 0918    Baseline Immunosuppressant Therapy Labs TB GOLD Quantiferon TB Gold Latest Ref Rng & Units 08/27/2019  Quantiferon TB Gold Plus NEGATIVE NEGATIVE   Hepatitis Panel Hepatitis Latest Ref Rng & Units 08/07/2016  Hep B Surface Ag NEGATIVE NEGATIVE  Hep B IgM NON REACTIVE NON REACTIVE  Hep C Ab NEGATIVE NEGATIVE   HIV Lab Results  Component Value Date   HIV NONREACTIVE 08/07/2016   Immunoglobulins Immunoglobulin Electrophoresis Latest Ref Rng & Units 08/07/2016  IgG 694 - 1,618 mg/dL 1,114  IgM 48 - 271 mg/dL 151   SPEP Serum Protein Electrophoresis Latest Ref Rng & Units 12/08/2019  Total Protein 6.1 - 8.1 g/dL 7.0  Albumin 3.8 - 4.8 g/dL -  Alpha-1 0.2 - 0.3 g/dL  -  Alpha-2 0.5 - 0.9 g/dL -  Beta Globulin 0.4 - 0.6 g/dL -  Beta 2 0.2 - 0.5 g/dL -  Gamma Globulin 0.8 - 1.7 g/dL -  Interpretation - -   G6PD No results found for: G6PDH TPMT No results found for: TPMT   Chest x-ray: 02/03/2011 No active disease  Administrations This Visit    Certolizumab Pegol KIT 400 mg    Admin Date 12/22/2019 Action Given Dose 400 mg Route Subcutaneous Administered By Carole Binning, LPN          Assessment/Plan:   Patient tolerated injection well. Patient was monitored in office for 30 minutes for adverse reactions. No adverse reactions noted.    Appointment for next injection scheduled for 01/06/2020.  Patient due for labs in 1 month.  Patient is to call and reschedule appointment if running a fever with signs/symptoms of infection, on antibiotics for active infection or has an upcoming invasive procedure.  All questions encouraged and answered.  Instructed patient to call with any further questions or concerns.

## 2020-01-05 ENCOUNTER — Ambulatory Visit (INDEPENDENT_AMBULATORY_CARE_PROVIDER_SITE_OTHER): Payer: BC Managed Care – PPO | Admitting: *Deleted

## 2020-01-05 ENCOUNTER — Other Ambulatory Visit: Payer: Self-pay

## 2020-01-05 VITALS — BP 144/98 | HR 77

## 2020-01-05 DIAGNOSIS — M0579 Rheumatoid arthritis with rheumatoid factor of multiple sites without organ or systems involvement: Secondary | ICD-10-CM | POA: Diagnosis not present

## 2020-01-05 DIAGNOSIS — L4050 Arthropathic psoriasis, unspecified: Secondary | ICD-10-CM

## 2020-01-05 MED ORDER — CERTOLIZUMAB PEGOL 2 X 200 MG ~~LOC~~ KIT
400.0000 mg | PACK | Freq: Once | SUBCUTANEOUS | Status: AC
  Administered 2020-01-05: 400 mg via SUBCUTANEOUS

## 2020-01-05 NOTE — Progress Notes (Signed)
Pharmacy Note  Subjective:   Patient presents to clinic today to receive monthly dose of Cimzia.  Patient running a fever or have signs/symptoms of infection? No  Patient currently on antibiotics for the treatment of infection? No  Patient have any upcoming invasive procedures/surgeries? No  Objective: CMP     Component Value Date/Time   NA 141 12/08/2019 0918   K 4.0 12/08/2019 0918   CL 105 12/08/2019 0918   CO2 29 12/08/2019 0918   GLUCOSE 87 12/08/2019 0918   BUN 14 12/08/2019 0918   CREATININE 0.80 12/08/2019 0918   CALCIUM 9.7 12/08/2019 0918   PROT 7.0 12/08/2019 0918   ALBUMIN 4.4 08/07/2016 0911   AST 15 12/08/2019 0918   ALT 17 12/08/2019 0918   ALKPHOS 82 08/07/2016 0911   BILITOT 0.6 12/08/2019 0918   GFRNONAA 78 12/08/2019 0918   GFRAA 90 12/08/2019 0918    CBC    Component Value Date/Time   WBC 7.5 12/08/2019 0918   RBC 5.09 12/08/2019 0918   HGB 14.7 12/08/2019 0918   HGB 14.4 11/25/2013 0943   HCT 44.0 12/08/2019 0918   PLT 217 12/08/2019 0918   MCV 86.4 12/08/2019 0918   MCH 28.9 12/08/2019 0918   MCHC 33.4 12/08/2019 0918   RDW 13.8 12/08/2019 0918   LYMPHSABS 2,115 12/08/2019 0918   MONOABS 560 08/07/2016 0911   EOSABS 120 12/08/2019 0918   BASOSABS 53 12/08/2019 0918    Baseline Immunosuppressant Therapy Labs TB GOLD Quantiferon TB Gold Latest Ref Rng & Units 08/27/2019  Quantiferon TB Gold Plus NEGATIVE NEGATIVE   Hepatitis Panel Hepatitis Latest Ref Rng & Units 08/07/2016  Hep B Surface Ag NEGATIVE NEGATIVE  Hep B IgM NON REACTIVE NON REACTIVE  Hep C Ab NEGATIVE NEGATIVE   HIV Lab Results  Component Value Date   HIV NONREACTIVE 08/07/2016   Immunoglobulins Immunoglobulin Electrophoresis Latest Ref Rng & Units 08/07/2016  IgG 694 - 1,618 mg/dL 1,114  IgM 48 - 271 mg/dL 151   SPEP Serum Protein Electrophoresis Latest Ref Rng & Units 12/08/2019  Total Protein 6.1 - 8.1 g/dL 7.0  Albumin 3.8 - 4.8 g/dL -  Alpha-1 0.2 - 0.3 g/dL  -  Alpha-2 0.5 - 0.9 g/dL -  Beta Globulin 0.4 - 0.6 g/dL -  Beta 2 0.2 - 0.5 g/dL -  Gamma Globulin 0.8 - 1.7 g/dL -  Interpretation - -   G6PD No results found for: G6PDH TPMT No results found for: TPMT   Chest x-ray: 02/03/2011 No active disease  Assessment/Plan:   Administrations This Visit    Certolizumab Pegol KIT 400 mg    Admin Date 01/05/2020 Action Given Dose 400 mg Route Subcutaneous Administered By Carole Binning, LPN          Patient tolerated injection well.    Appointment for next injection scheduled for 01/19/2020.  Patient due for labs in December 2021.  Patient is to call and reschedule appointment if running a fever with signs/symptoms of infection, on antibiotics for active infection or has an upcoming invasive procedure.  All questions encouraged and answered.  Instructed patient to call with any further questions or concerns.

## 2020-01-19 ENCOUNTER — Ambulatory Visit (INDEPENDENT_AMBULATORY_CARE_PROVIDER_SITE_OTHER): Payer: BC Managed Care – PPO | Admitting: *Deleted

## 2020-01-19 ENCOUNTER — Other Ambulatory Visit: Payer: Self-pay

## 2020-01-19 VITALS — BP 135/89 | HR 71

## 2020-01-19 DIAGNOSIS — Z79899 Other long term (current) drug therapy: Secondary | ICD-10-CM | POA: Diagnosis not present

## 2020-01-19 DIAGNOSIS — L4050 Arthropathic psoriasis, unspecified: Secondary | ICD-10-CM

## 2020-01-19 MED ORDER — CERTOLIZUMAB PEGOL 2 X 200 MG ~~LOC~~ KIT
400.0000 mg | PACK | Freq: Once | SUBCUTANEOUS | Status: AC
Start: 2020-01-19 — End: 2020-01-19
  Administered 2020-01-19: 400 mg via SUBCUTANEOUS

## 2020-01-19 NOTE — Progress Notes (Signed)
Pharmacy Note  Subjective:   Patient presents to clinic today to receive monthly dose of Cimzia.  Patient running a fever or have signs/symptoms of infection? No  Patient currently on antibiotics for the treatment of infection? No  Patient have any upcoming invasive procedures/surgeries? No  Objective: CMP     Component Value Date/Time   NA 141 12/08/2019 0918   K 4.0 12/08/2019 0918   CL 105 12/08/2019 0918   CO2 29 12/08/2019 0918   GLUCOSE 87 12/08/2019 0918   BUN 14 12/08/2019 0918   CREATININE 0.80 12/08/2019 0918   CALCIUM 9.7 12/08/2019 0918   PROT 7.0 12/08/2019 0918   ALBUMIN 4.4 08/07/2016 0911   AST 15 12/08/2019 0918   ALT 17 12/08/2019 0918   ALKPHOS 82 08/07/2016 0911   BILITOT 0.6 12/08/2019 0918   GFRNONAA 78 12/08/2019 0918   GFRAA 90 12/08/2019 0918    CBC    Component Value Date/Time   WBC 7.5 12/08/2019 0918   RBC 5.09 12/08/2019 0918   HGB 14.7 12/08/2019 0918   HGB 14.4 11/25/2013 0943   HCT 44.0 12/08/2019 0918   PLT 217 12/08/2019 0918   MCV 86.4 12/08/2019 0918   MCH 28.9 12/08/2019 0918   MCHC 33.4 12/08/2019 0918   RDW 13.8 12/08/2019 0918   LYMPHSABS 2,115 12/08/2019 0918   MONOABS 560 08/07/2016 0911   EOSABS 120 12/08/2019 0918   BASOSABS 53 12/08/2019 0918    Baseline Immunosuppressant Therapy Labs TB GOLD Quantiferon TB Gold Latest Ref Rng & Units 08/27/2019  Quantiferon TB Gold Plus NEGATIVE NEGATIVE   Hepatitis Panel Hepatitis Latest Ref Rng & Units 08/07/2016  Hep B Surface Ag NEGATIVE NEGATIVE  Hep B IgM NON REACTIVE NON REACTIVE  Hep C Ab NEGATIVE NEGATIVE   HIV Lab Results  Component Value Date   HIV NONREACTIVE 08/07/2016   Immunoglobulins Immunoglobulin Electrophoresis Latest Ref Rng & Units 08/07/2016  IgG 694 - 1,618 mg/dL 1,114  IgM 48 - 271 mg/dL 151   SPEP Serum Protein Electrophoresis Latest Ref Rng & Units 12/08/2019  Total Protein 6.1 - 8.1 g/dL 7.0  Albumin 3.8 - 4.8 g/dL -  Alpha-1 0.2 - 0.3 g/dL  -  Alpha-2 0.5 - 0.9 g/dL -  Beta Globulin 0.4 - 0.6 g/dL -  Beta 2 0.2 - 0.5 g/dL -  Gamma Globulin 0.8 - 1.7 g/dL -  Interpretation - -   G6PD No results found for: G6PDH TPMT No results found for: TPMT   Chest x-ray: 12/28/2012No active disease  Assessment/Plan:   Administrations This Visit    Certolizumab Pegol KIT 400 mg    Admin Date 01/19/2020 Action Given Dose 400 mg Route Subcutaneous Administered By Carole Binning, LPN          Patient tolerated injection well.   Appointment for next injection scheduled for February 16, 2020.  Patient due for labs in March 2022.  Patient is to call and reschedule appointment if running a fever with signs/symptoms of infection, on antibiotics for active infection or has an upcoming invasive procedure.  All questions encouraged and answered.  Instructed patient to call with any further questions or concerns.

## 2020-01-19 NOTE — Patient Instructions (Signed)
Standing Labs We placed an order today for your standing lab work.   Please have your standing labs drawn in March 2022.   If possible, please have your labs drawn 2 weeks prior to your appointment so that the provider can discuss your results at your appointment.  We have open lab daily Monday through Thursday from 8:30-12:30 PM and 1:30-4:30 PM and Friday from 8:30-12:30 PM and 1:30-4:00 PM at the office of Dr. Bo Merino, [facility].   Please be advised, patients with office appointments requiring lab work will take precedents over walk-in lab work.  If possible, please come for your lab work on Monday and Friday afternoons, as you may experience shorter wait times. The office is located at [address] [place], [place], [place] [address] No appointment is necessary.   Labs are drawn by [facility]. Please bring your co-pay at the time of your lab draw.  You may receive a bill from [facility] for your lab work.  If you wish to have your labs drawn at another location, please call the office 24 hours in advance to send orders.  If you have any questions regarding directions or hours of operation,  please call [phone].   As a reminder, please drink plenty of water prior to coming for your lab work. Thanks!

## 2020-01-20 LAB — COMPLETE METABOLIC PANEL WITH GFR
AG Ratio: 1.7 (calc) (ref 1.0–2.5)
ALT: 18 U/L (ref 6–29)
AST: 18 U/L (ref 10–35)
Albumin: 4.3 g/dL (ref 3.6–5.1)
Alkaline phosphatase (APISO): 70 U/L (ref 37–153)
BUN: 16 mg/dL (ref 7–25)
CO2: 28 mmol/L (ref 20–32)
Calcium: 9.3 mg/dL (ref 8.6–10.4)
Chloride: 106 mmol/L (ref 98–110)
Creat: 0.83 mg/dL (ref 0.50–0.99)
GFR, Est African American: 86 mL/min/{1.73_m2} (ref >=60)
GFR, Est Non African American: 75 mL/min/{1.73_m2} (ref >=60)
Globulin: 2.6 g/dL (calc) (ref 1.9–3.7)
Glucose, Bld: 83 mg/dL (ref 65–139)
Potassium: 4.5 mmol/L (ref 3.5–5.3)
Sodium: 141 mmol/L (ref 135–146)
Total Bilirubin: 0.5 mg/dL (ref 0.2–1.2)
Total Protein: 6.9 g/dL (ref 6.1–8.1)

## 2020-01-20 LAB — CBC WITH DIFFERENTIAL/PLATELET
Absolute Monocytes: 517 cells/uL (ref 200–950)
Basophils Absolute: 39 cells/uL (ref 0–200)
Basophils Relative: 0.7 %
Eosinophils Absolute: 99 cells/uL (ref 15–500)
Eosinophils Relative: 1.8 %
HCT: 42.8 % (ref 35.0–45.0)
Hemoglobin: 14.8 g/dL (ref 11.7–15.5)
Lymphs Abs: 2107 cells/uL (ref 850–3900)
MCH: 29.8 pg (ref 27.0–33.0)
MCHC: 34.6 g/dL (ref 32.0–36.0)
MCV: 86.1 fL (ref 80.0–100.0)
MPV: 13.2 fL — ABNORMAL HIGH (ref 7.5–12.5)
Monocytes Relative: 9.4 %
Neutro Abs: 2739 cells/uL (ref 1500–7800)
Neutrophils Relative %: 49.8 %
Platelets: 189 10*3/uL (ref 140–400)
RBC: 4.97 10*6/uL (ref 3.80–5.10)
RDW: 14.4 % (ref 11.0–15.0)
Total Lymphocyte: 38.3 %
WBC: 5.5 10*3/uL (ref 3.8–10.8)

## 2020-02-11 ENCOUNTER — Telehealth: Payer: Self-pay | Admitting: *Deleted

## 2020-02-11 NOTE — Telephone Encounter (Signed)
Attempted to contact the patient and left message for patient to call the office.  

## 2020-02-11 NOTE — Telephone Encounter (Signed)
Cynthia Snyder states she and her husband have Covid. Cynthia Snyder states she started having symptoms on 02/02/2020. Cynthia Snyder is on MTX and Cimzia. Cynthia Snyder advised to hold her MTX.   Cynthia Snyder is due for her Cimzia on 02/16/2020 and will postpone that injection. How long should she postpone her Cimzia?

## 2020-02-11 NOTE — Telephone Encounter (Signed)
Patient should be able to receive Cimzia injection 4 weeks after the onset of Covid symptoms if she is asymptomatic.

## 2020-02-12 NOTE — Telephone Encounter (Signed)
Patient advised she should be able to receive Cimzia injection 4 weeks after the onset of Covid symptoms if she is asymptomatic. Patient will call once she no longer has symptoms to reschedule her appointment.

## 2020-02-16 ENCOUNTER — Ambulatory Visit: Payer: BC Managed Care – PPO

## 2020-02-16 ENCOUNTER — Ambulatory Visit: Payer: BC Managed Care – PPO | Admitting: Physician Assistant

## 2020-03-01 ENCOUNTER — Other Ambulatory Visit: Payer: Self-pay

## 2020-03-01 ENCOUNTER — Ambulatory Visit (INDEPENDENT_AMBULATORY_CARE_PROVIDER_SITE_OTHER): Payer: Medicare Other | Admitting: *Deleted

## 2020-03-01 VITALS — BP 144/97 | HR 76

## 2020-03-01 DIAGNOSIS — L4050 Arthropathic psoriasis, unspecified: Secondary | ICD-10-CM | POA: Diagnosis not present

## 2020-03-01 MED ORDER — CERTOLIZUMAB PEGOL 2 X 200 MG ~~LOC~~ KIT
400.0000 mg | PACK | Freq: Once | SUBCUTANEOUS | Status: AC
Start: 2020-03-01 — End: 2020-03-01
  Administered 2020-03-01: 400 mg via SUBCUTANEOUS

## 2020-03-01 NOTE — Progress Notes (Signed)
Pharmacy Note  Subjective:   Patient presents to clinic today to receive monthly dose of Cimzia.  Patient running a fever or have signs/symptoms of infection? No  Patient currently on antibiotics for the treatment of infection? No  Patient have any upcoming invasive procedures/surgeries? No  Objective: CMP     Component Value Date/Time   NA 141 01/19/2020 1013   K 4.5 01/19/2020 1013   CL 106 01/19/2020 1013   CO2 28 01/19/2020 1013   GLUCOSE 83 01/19/2020 1013   BUN 16 01/19/2020 1013   CREATININE 0.83 01/19/2020 1013   CALCIUM 9.3 01/19/2020 1013   PROT 6.9 01/19/2020 1013   ALBUMIN 4.4 08/07/2016 0911   AST 18 01/19/2020 1013   ALT 18 01/19/2020 1013   ALKPHOS 82 08/07/2016 0911   BILITOT 0.5 01/19/2020 1013   GFRNONAA 75 01/19/2020 1013   GFRAA 86 01/19/2020 1013    CBC    Component Value Date/Time   WBC 5.5 01/19/2020 1013   RBC 4.97 01/19/2020 1013   HGB 14.8 01/19/2020 1013   HGB 14.4 11/25/2013 0943   HCT 42.8 01/19/2020 1013   PLT 189 01/19/2020 1013   MCV 86.1 01/19/2020 1013   MCH 29.8 01/19/2020 1013   MCHC 34.6 01/19/2020 1013   RDW 14.4 01/19/2020 1013   LYMPHSABS 2,107 01/19/2020 1013   MONOABS 560 08/07/2016 0911   EOSABS 99 01/19/2020 1013   BASOSABS 39 01/19/2020 1013    Baseline Immunosuppressant Therapy Labs TB GOLD Quantiferon TB Gold Latest Ref Rng & Units 08/27/2019  Quantiferon TB Gold Plus NEGATIVE NEGATIVE   Hepatitis Panel Hepatitis Latest Ref Rng & Units 08/07/2016  Hep B Surface Ag NEGATIVE NEGATIVE  Hep B IgM NON REACTIVE NON REACTIVE  Hep C Ab NEGATIVE NEGATIVE   HIV Lab Results  Component Value Date   HIV NONREACTIVE 08/07/2016   Immunoglobulins Immunoglobulin Electrophoresis Latest Ref Rng & Units 08/07/2016  IgG 694 - 1,618 mg/dL 1,114  IgM 48 - 271 mg/dL 151   SPEP Serum Protein Electrophoresis Latest Ref Rng & Units 01/19/2020  Total Protein 6.1 - 8.1 g/dL 6.9  Albumin 3.8 - 4.8 g/dL -  Alpha-1 0.2 - 0.3 g/dL  -  Alpha-2 0.5 - 0.9 g/dL -  Beta Globulin 0.4 - 0.6 g/dL -  Beta 2 0.2 - 0.5 g/dL -  Gamma Globulin 0.8 - 1.7 g/dL -  Interpretation - -   G6PD No results found for: G6PDH TPMT No results found for: TPMT   Chest x-ray: 12/28/2012No active disease  Assessment/Plan:   Administrations This Visit    Certolizumab Pegol KIT 400 mg    Admin Date 03/01/2020 Action Given Dose 400 mg Route Subcutaneous Administered By Carole Binning, LPN         Patient tolerated injection well.    Appointment for next injection scheduled for 03/29/2020.  Patient due for labs in March 2022.  Patient is to call and reschedule appointment if running a fever with signs/symptoms of infection, on antibiotics for active infection or has an upcoming invasive procedure.  All questions encouraged and answered.  Instructed patient to call with any further questions or concerns.

## 2020-03-08 ENCOUNTER — Ambulatory Visit: Payer: BC Managed Care – PPO | Admitting: Certified Nurse Midwife

## 2020-03-29 ENCOUNTER — Ambulatory Visit (INDEPENDENT_AMBULATORY_CARE_PROVIDER_SITE_OTHER): Payer: Medicare Other | Admitting: *Deleted

## 2020-03-29 ENCOUNTER — Other Ambulatory Visit: Payer: Self-pay

## 2020-03-29 VITALS — BP 138/83 | HR 106

## 2020-03-29 DIAGNOSIS — L4059 Other psoriatic arthropathy: Secondary | ICD-10-CM | POA: Diagnosis not present

## 2020-03-29 DIAGNOSIS — L4050 Arthropathic psoriasis, unspecified: Secondary | ICD-10-CM

## 2020-03-29 MED ORDER — CERTOLIZUMAB PEGOL 2 X 200 MG ~~LOC~~ KIT
400.0000 mg | PACK | Freq: Once | SUBCUTANEOUS | Status: AC
  Administered 2020-03-29: 400 mg via SUBCUTANEOUS

## 2020-03-29 NOTE — Progress Notes (Signed)
Pharmacy Note  Subjective:   Patient presents to clinic today to receive monthly dose of Cimzia.  Patient running a fever or have signs/symptoms of infection? No  Patient currently on antibiotics for the treatment of infection? No  Patient have any upcoming invasive procedures/surgeries? No  Objective: CMP     Component Value Date/Time   NA 141 01/19/2020 1013   K 4.5 01/19/2020 1013   CL 106 01/19/2020 1013   CO2 28 01/19/2020 1013   GLUCOSE 83 01/19/2020 1013   BUN 16 01/19/2020 1013   CREATININE 0.83 01/19/2020 1013   CALCIUM 9.3 01/19/2020 1013   PROT 6.9 01/19/2020 1013   ALBUMIN 4.4 08/07/2016 0911   AST 18 01/19/2020 1013   ALT 18 01/19/2020 1013   ALKPHOS 82 08/07/2016 0911   BILITOT 0.5 01/19/2020 1013   GFRNONAA 75 01/19/2020 1013   GFRAA 86 01/19/2020 1013    CBC    Component Value Date/Time   WBC 5.5 01/19/2020 1013   RBC 4.97 01/19/2020 1013   HGB 14.8 01/19/2020 1013   HGB 14.4 11/25/2013 0943   HCT 42.8 01/19/2020 1013   PLT 189 01/19/2020 1013   MCV 86.1 01/19/2020 1013   MCH 29.8 01/19/2020 1013   MCHC 34.6 01/19/2020 1013   RDW 14.4 01/19/2020 1013   LYMPHSABS 2,107 01/19/2020 1013   MONOABS 560 08/07/2016 0911   EOSABS 99 01/19/2020 1013   BASOSABS 39 01/19/2020 1013    Baseline Immunosuppressant Therapy Labs TB GOLD Quantiferon TB Gold Latest Ref Rng & Units 08/27/2019  Quantiferon TB Gold Plus NEGATIVE NEGATIVE   Hepatitis Panel Hepatitis Latest Ref Rng & Units 08/07/2016  Hep B Surface Ag NEGATIVE NEGATIVE  Hep B IgM NON REACTIVE NON REACTIVE  Hep C Ab NEGATIVE NEGATIVE   HIV Lab Results  Component Value Date   HIV NONREACTIVE 08/07/2016   Immunoglobulins Immunoglobulin Electrophoresis Latest Ref Rng & Units 08/07/2016  IgG 694 - 1,618 mg/dL 1,114  IgM 48 - 271 mg/dL 151   SPEP Serum Protein Electrophoresis Latest Ref Rng & Units 01/19/2020  Total Protein 6.1 - 8.1 g/dL 6.9  Albumin 3.8 - 4.8 g/dL -  Alpha-1 0.2 - 0.3 g/dL  -  Alpha-2 0.5 - 0.9 g/dL -  Beta Globulin 0.4 - 0.6 g/dL -  Beta 2 0.2 - 0.5 g/dL -  Gamma Globulin 0.8 - 1.7 g/dL -  Interpretation - -   G6PD No results found for: G6PDH TPMT No results found for: TPMT   Chest x-ray: 12/28/2012No active disease  Assessment/Plan:   Administrations This Visit    Certolizumab Pegol KIT 400 mg    Admin Date 03/29/2020 Action Given Dose 400 mg Route Subcutaneous Administered By Carole Binning, LPN          Patient tolerated injection well.   Appointment for next injection scheduled for April 26, 2020.  Patient due for labs in March 2022.  Patient is to call and reschedule appointment if running a fever with signs/symptoms of infection, on antibiotics for active infection or has an upcoming invasive procedure.  All questions encouraged and answered.  Instructed patient to call with any further questions or concerns.

## 2020-03-30 ENCOUNTER — Other Ambulatory Visit: Payer: Self-pay

## 2020-03-30 ENCOUNTER — Encounter: Payer: Self-pay | Admitting: Nurse Practitioner

## 2020-03-30 ENCOUNTER — Ambulatory Visit (INDEPENDENT_AMBULATORY_CARE_PROVIDER_SITE_OTHER): Payer: Medicare Other | Admitting: Nurse Practitioner

## 2020-03-30 ENCOUNTER — Ambulatory Visit: Payer: Medicare Other | Admitting: Obstetrics and Gynecology

## 2020-03-30 VITALS — BP 120/80 | HR 80 | Ht 61.5 in | Wt 179.0 lb

## 2020-03-30 DIAGNOSIS — Z01419 Encounter for gynecological examination (general) (routine) without abnormal findings: Secondary | ICD-10-CM

## 2020-03-30 DIAGNOSIS — Z90710 Acquired absence of both cervix and uterus: Secondary | ICD-10-CM

## 2020-03-30 DIAGNOSIS — Z90722 Acquired absence of ovaries, bilateral: Secondary | ICD-10-CM

## 2020-03-30 DIAGNOSIS — Z9079 Acquired absence of other genital organ(s): Secondary | ICD-10-CM

## 2020-03-30 DIAGNOSIS — Z780 Asymptomatic menopausal state: Secondary | ICD-10-CM

## 2020-03-30 NOTE — Progress Notes (Signed)
   Cynthia Snyder December 10, 1955 [id number]   History:  65 y.o. G2P2002 presents for annual exam without GYN complaints. Postmenopausal - no HRT, no bleeding. Normal pap an mammogram history. TAH LSO at age 65 for endometriosis, 2010 RSO for endometriosis.   Gynecologic History Patient's last menstrual period was 02/06/1982.   Contraception/Family planning: status post hysterectomy  Health Maintenance Last Pap: No longer screening per guidelines Last mammogram: 04/2019. Results were: normal Last colonoscopy: 2020. Results were: polyps, 7-year recall Last Dexa: 2012. Results were: normal  Past medical history, past surgical history, family history and social history were all reviewed and documented in the EPIC chart.  ROS:  A ROS was performed and pertinent positives and negatives are included.  Exam:  Vitals:   03/30/20 1330  BP: 120/80  Pulse: 80  Weight: 179 lb (81.2 kg)  Height: 5' 1.5" (1.562 m)   Body mass index is 33.27 kg/m.  General appearance:  Normal Thyroid:  Symmetrical, normal in size, without palpable masses or nodularity. Respiratory  Auscultation:  Clear without wheezing or rhonchi Cardiovascular  Auscultation:  Regular rate, without rubs, murmurs or gallops  Edema/varicosities:  Not grossly evident Abdominal  Soft,nontender, without masses, guarding or rebound.  Liver/spleen:  No organomegaly noted  Hernia:  None appreciated  Skin  Inspection:  Grossly normal   Breasts: Examined lying and sitting.   Right: Without masses, retractions, discharge or axillary adenopathy.   Left: Without masses, retractions, discharge or axillary adenopathy. Gentitourinary   Inguinal/mons:  Normal without inguinal adenopathy  External genitalia:  Normal  BUS/Urethra/Skene's glands:  Normal  Vagina:  Normal  Cervix:  Absent  Uterus:  Absent  Adnexa/parametria:     Rt: Without masses or tenderness.   Lt: Without masses or tenderness.  Anus and  perineum: Normal  Digital rectal exam: Normal sphincter tone without palpated masses or tenderness  Assessment/Plan:  65 y.o. [id number] for annual exam.   Well female exam with routine gynecological exam - Education provided on SBEs, importance of preventative screenings, current guidelines, high calcium diet, regular exercise, and multivitamin daily. Labs with PCP.   Postmenopausal - Plan: DG Bone Density  History of total abdominal hysterectomy and bilateral salpingo-oophorectomy - TAH LSO at age 65 for endometriosis, 2010 RSO for endometriosis. No HRT.   Screening for cervical cancer - Normal Pap history.  No longer screening per guidelines.   Screening for breast cancer - Normal mammogram history.  Continue annual screenings.  Normal breast exam today.  Screening for colon cancer - 2020 colonoscopy. Will repeat at GI's recommended interval.   Screening for osteoporosis - 2020 DEXA was normal. Will schedule Dexa.   Follow up in 1 year for annual.   [place], 1:36 PM 03/30/2020

## 2020-03-30 NOTE — Patient Instructions (Signed)

## 2020-04-19 ENCOUNTER — Telehealth: Payer: Self-pay

## 2020-04-19 NOTE — Telephone Encounter (Signed)
Patient is now rescheduled for 05/03/2020.

## 2020-04-19 NOTE — Telephone Encounter (Signed)
Please advise. Thank you

## 2020-04-19 NOTE — Telephone Encounter (Signed)
Patient called requesting a return call to reschedule her Cimzia appointment that was scheduled for Monday, 04/26/20.  Patient states her husband has an appointment at [facility] that morning.  Please advise.

## 2020-04-19 NOTE — Telephone Encounter (Signed)
Attempted to contact patient and patient was unavailable at the moment. She will return the call.

## 2020-04-26 ENCOUNTER — Ambulatory Visit: Payer: Medicare Other

## 2020-04-26 NOTE — Progress Notes (Signed)
Office Visit Note  Patient: Cynthia Snyder             Date of Birth: 1955-11-14           MRN: [id number]             PCP: Chesley Noon, MD Referring: Chesley Noon, MD Visit Date: 05/10/2020 Occupation: @GUAROCC @  Subjective:  Pain in both wrist and ankles.   History of Present Illness: Cynthia Snyder is a 65 y.o. female with a history of psoriatic arthritis.  She has been taking Cimzia subcu injections in the office on monthly basis and also has been on methotrexate 7 tablets p.o. weekly.  She states she has not missed any doses.  She has been under a lot of stress.  She has been taking care of her parents.  Her husband's daughter also doing well currently.  She has been doing some gardening as well.  She states for the last few days she has noticed increased pain and swelling in her bilateral wrist joints and her bilateral ankles.  None of the other joints are painful.  Denies any psoriasis rash.  Activities of Daily Living:  Patient reports morning stiffness for 30 minutes.   Patient Reports nocturnal pain.  Difficulty dressing/grooming: Denies Difficulty climbing stairs: Reports Difficulty getting out of chair: Denies Difficulty using hands for taps, buttons, cutlery, and/or writing: Reports  Review of Systems  Constitutional: Positive for fatigue. Negative for night sweats, weight gain and weight loss.  HENT: Positive for mouth dryness. Negative for mouth sores, trouble swallowing, trouble swallowing and nose dryness.   Eyes: Negative for pain, redness, itching, visual disturbance and dryness.  Respiratory: Negative for cough, hemoptysis, shortness of breath and difficulty breathing.   Cardiovascular: Negative for chest pain, palpitations, hypertension, irregular heartbeat and swelling in legs/feet.  Gastrointestinal: Negative for abdominal pain, blood in stool, constipation and diarrhea.  Endocrine: Negative for increased urination.  Genitourinary:  Negative for painful urination and vaginal dryness.  Musculoskeletal: Positive for arthralgias, joint pain, joint swelling and morning stiffness. Negative for myalgias, muscle weakness, muscle tenderness and myalgias.  Skin: Negative for color change, rash, hair loss, redness, skin tightness, ulcers and sensitivity to sunlight.  Allergic/Immunologic: Negative for susceptible to infections.  Neurological: Negative for dizziness, numbness, headaches, paresthesias, memory loss, night sweats and weakness.  Hematological: Negative for swollen glands.  Psychiatric/Behavioral: Positive for depressed mood and sleep disturbance. Negative for confusion. The patient is nervous/anxious.     PMFS History:  Patient Active Problem List   Diagnosis Date Noted  . 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 Pain of right hip joint 03/26/2017  . Psoriatic arthropathy ([place]) 11/07/2016  . Psoriasis 08/04/2016  . DJD (degenerative joint disease), cervical 08/04/2016  . Esophagitis 08/04/2016  . Restless leg syndrome 08/04/2016  . High risk medication use 08/04/2016  . Atrophic vaginitis 01/22/2015    Class: Chronic    Past Medical History:  Diagnosis Date  . Abnormal Pap smear    many yrs ago  . Endometrial cancer ([place])   . Endometriosis   . Esophagus disorder   . Psoriatic arthritis ([place])   . Shingles   . Shingles     Family History  Problem Relation Age of Onset  . Heart disease Mother   . Hypertension Mother   . Stroke Mother   . Diabetes Mother   . Osteoporosis Mother   . Hypothyroidism Mother   . Heart disease Father   .  Heart disease Brother   . Stroke Brother   . Cancer Paternal Aunt        ovarian  . Colon cancer Maternal Grandmother   . Breast cancer Neg Hx    Past Surgical History:  Procedure Laterality Date  . ABDOMINAL HYSTERECTOMY     age 62  . APPENDECTOMY    . BILATERAL SALPINGOOPHORECTOMY Right 11/24/08   lysis of adhesion  . CATARACT EXTRACTION,  BILATERAL    . esophageal stretched    . EXPLORATORY LAPAROTOMY    . jaw bone graft  2004  . KNEE ARTHROPLASTY    . KNEE SURGERY    . LAPAROSCOPY     age 61  . LIPOSUCTION TRUNK  04/06/2016   Dr. Lenon Curt for body image  . LIVER BIOPSY     mild steatosis  . NOSE SURGERY     Social History   Social History Narrative  . Not on file   Immunization History  Administered Date(s) Administered  . Influenza Inj Mdck Quad Pf 10/23/2017  . Influenza Split 10/20/2017  . Influenza, Seasonal, Injecte, Preservative Fre 10/29/2013, 12/08/2014, 11/18/2015  . Moderna Sars-Covid-2 Vaccination 04/28/2019, 05/29/2019  . Tdap 11/06/2012  . Zoster 10/01/2015     Objective: Vital Signs: BP 133/88 (BP Location: Left Arm, Patient Position: Sitting, Cuff Size: Normal)   Pulse 84   Ht 5\' 2"  (1.575 m)   Wt 181 lb (82.1 kg)   LMP 02/06/1982   BMI 33.11 kg/m    Physical Exam Vitals and nursing note reviewed.  Constitutional:      Appearance: She is well-developed.  HENT:     Head: Normocephalic and atraumatic.  Eyes:     Conjunctiva/sclera: Conjunctivae normal.  Cardiovascular:     Rate and Rhythm: Normal rate and regular rhythm.     Heart sounds: Normal heart sounds.  Pulmonary:     Effort: Pulmonary effort is normal.     Breath sounds: Normal breath sounds.  Abdominal:     General: Bowel sounds are normal.     Palpations: Abdomen is soft.  Musculoskeletal:     Cervical back: Normal range of motion.  Lymphadenopathy:     Cervical: No cervical adenopathy.  Skin:    General: Skin is warm and dry.     Capillary Refill: Capillary refill takes less than 2 seconds.  Neurological:     Mental Status: She is alert and oriented to person, place, and time.  Psychiatric:        Behavior: Behavior normal.      Musculoskeletal Exam: C-spine was in good range of motion.  Shoulder joints, elbow joints, wrist joints with good range of motion with no synovitis or tenderness.  She has bilateral PIP  and DIP thickening with no synovitis.  Hip joints, knee joints, ankles, MTPs and PIPs with good range of motion with no synovitis.  CDAI Exam: CDAI Score: -- Patient Global: --; Provider Global: -- Swollen: --; Tender: -- Joint Exam 05/10/2020   No joint exam has been documented for this visit   There is currently no information documented on the homunculus. Go to the Rheumatology activity and complete the homunculus joint exam.  Investigation: No additional findings.  Imaging: No results found.  Recent Labs: Lab Results  Component Value Date   WBC 7.6 05/03/2020   HGB 14.1 05/03/2020   PLT 206 05/03/2020   NA 142 05/03/2020   K 3.9 05/03/2020   CL 107 05/03/2020   CO2 26 05/03/2020  GLUCOSE 97 05/03/2020   BUN 14 05/03/2020   CREATININE 0.91 05/03/2020   BILITOT 0.6 05/03/2020   ALKPHOS 82 08/07/2016   AST 17 05/03/2020   ALT 16 05/03/2020   PROT 7.0 05/03/2020   ALBUMIN 4.4 08/07/2016   CALCIUM 9.5 05/03/2020   GFRAA 77 05/03/2020   QFTBGOLDPLUS NEGATIVE 08/27/2019    Speciality Comments: No specialty comments available.  Procedures:  No procedures performed Allergies: Patient has no known allergies.   Assessment / Plan:     Visit Diagnoses: Psoriatic arthropathy (HCC)-patient had no synovitis on examination.  She complains of some discomfort in her wrist joints and her ankle joints.  No swelling was noted.  She has been taking care of her elderly parents and also her husband has been sick.  She also has been gardening which could be causing increased stress on her joints.  Psoriasis-she currently does not have any psoriasis patches.  High risk medication use -  She will continue taking Methotrexate 7 tablets by mouth once weekly (4 on saturdays and 3 tablets on sundays).  D/c Enbrel-August 2021 as it was not covered by Medicare.  She was started on Cimzia subcutaneous December 22, 2019 in office injections which she has been tolerating well.  Labs obtained on  May 03, 2020 were normal.  TB gold was negative on August 27, 2019.  She has been advised to get labs in June and then every 3 months to monitor for drug toxicity.  TB gold will be obtained with next labs.  She has been advised to discontinue medications in case she develops infection.  She may resume the medications first infection resolves.  She can also advised to get yearly skin examination to screen for nonmelanoma skin cancer.  Booster dose for COVID-19 was discussed.  Use of Shingrix and pneumococcal vaccine was discussed.   Association of heart disease with psoriatic arthritis was discussed.  Instructions were placed in the AVS.  DDD (degenerative disc disease), cervical-she had good range of motion without discomfort.  History of restless legs syndrome  History of esophagitis  Muscular deconditioning-need for regular exercise which is emphasized.  She has been very busy taking care of her parents currently.  Orders: No orders of the defined types were placed in this encounter.  No orders of the defined types were placed in this encounter.    Follow-Up Instructions: Return in about 3 months (around 08/09/2020) for Psoriatic arthritis.   Bo Merino, MD  Note - This record has been created using Editor, commissioning.  Chart creation errors have been sought, but may not always  have been located. Such creation errors do not reflect on  the standard of medical care.

## 2020-04-28 ENCOUNTER — Other Ambulatory Visit: Payer: Self-pay | Admitting: Rheumatology

## 2020-04-29 NOTE — Telephone Encounter (Signed)
Next Visit: 05/10/2020  Last Visit: 12/08/2019  Last Fill: 09/09/2019  DX: Psoriatic arthropathy   Current Dose per office note 12/08/2019, methotrexate 7 tablets by mouth once weekly   Labs: 01/19/2020, CMP WNL. MPV is mildly elevated. Rest of CBC WNL. No further recommendations at this time. We will continue to monitor lab work. [facility] labs are due.  Okay to refill MTX?

## 2020-05-03 ENCOUNTER — Ambulatory Visit (INDEPENDENT_AMBULATORY_CARE_PROVIDER_SITE_OTHER): Payer: Medicare Other

## 2020-05-03 ENCOUNTER — Other Ambulatory Visit: Payer: Self-pay

## 2020-05-03 VITALS — BP 150/89 | HR 97

## 2020-05-03 DIAGNOSIS — Z79899 Other long term (current) drug therapy: Secondary | ICD-10-CM

## 2020-05-03 DIAGNOSIS — L4059 Other psoriatic arthropathy: Secondary | ICD-10-CM

## 2020-05-03 DIAGNOSIS — L4050 Arthropathic psoriasis, unspecified: Secondary | ICD-10-CM

## 2020-05-03 MED ORDER — CERTOLIZUMAB PEGOL 2 X 200 MG ~~LOC~~ KIT
400.0000 mg | PACK | Freq: Once | SUBCUTANEOUS | Status: AC
Start: 2020-05-03 — End: 2020-05-03
  Administered 2020-05-03: 400 mg via SUBCUTANEOUS

## 2020-05-03 NOTE — Progress Notes (Signed)
Pharmacy Note  Subjective:   Patient presents to clinic today to receive monthly dose of Cimzia.  Patient running a fever or have signs/symptoms of infection? No  Patient currently on antibiotics for the treatment of infection? No  Patient have any upcoming invasive procedures/surgeries? No  Objective: CMP     Component Value Date/Time   NA 141 01/19/2020 1013   K 4.5 01/19/2020 1013   CL 106 01/19/2020 1013   CO2 28 01/19/2020 1013   GLUCOSE 83 01/19/2020 1013   BUN 16 01/19/2020 1013   CREATININE 0.83 01/19/2020 1013   CALCIUM 9.3 01/19/2020 1013   PROT 6.9 01/19/2020 1013   ALBUMIN 4.4 08/07/2016 0911   AST 18 01/19/2020 1013   ALT 18 01/19/2020 1013   ALKPHOS 82 08/07/2016 0911   BILITOT 0.5 01/19/2020 1013   GFRNONAA 75 01/19/2020 1013   GFRAA 86 01/19/2020 1013    CBC    Component Value Date/Time   WBC 5.5 01/19/2020 1013   RBC 4.97 01/19/2020 1013   HGB 14.8 01/19/2020 1013   HGB 14.4 11/25/2013 0943   HCT 42.8 01/19/2020 1013   PLT 189 01/19/2020 1013   MCV 86.1 01/19/2020 1013   MCH 29.8 01/19/2020 1013   MCHC 34.6 01/19/2020 1013   RDW 14.4 01/19/2020 1013   LYMPHSABS 2,107 01/19/2020 1013   MONOABS 560 08/07/2016 0911   EOSABS 99 01/19/2020 1013   BASOSABS 39 01/19/2020 1013    Baseline Immunosuppressant Therapy Labs TB GOLD Quantiferon TB Gold Latest Ref Rng & Units 08/27/2019  Quantiferon TB Gold Plus NEGATIVE NEGATIVE   Hepatitis Panel Hepatitis Latest Ref Rng & Units 08/07/2016  Hep B Surface Ag NEGATIVE NEGATIVE  Hep B IgM NON REACTIVE NON REACTIVE  Hep C Ab NEGATIVE NEGATIVE   HIV Lab Results  Component Value Date   HIV NONREACTIVE 08/07/2016   Immunoglobulins Immunoglobulin Electrophoresis Latest Ref Rng & Units 08/07/2016  IgG 694 - 1,618 mg/dL 1,114  IgM 48 - 271 mg/dL 151   SPEP Serum Protein Electrophoresis Latest Ref Rng & Units 01/19/2020  Total Protein 6.1 - 8.1 g/dL 6.9  Albumin 3.8 - 4.8 g/dL -  Alpha-1 0.2 - 0.3 g/dL  -  Alpha-2 0.5 - 0.9 g/dL -  Beta Globulin 0.4 - 0.6 g/dL -  Beta 2 0.2 - 0.5 g/dL -  Gamma Globulin 0.8 - 1.7 g/dL -  Interpretation - -   G6PD No results found for: G6PDH TPMT No results found for: TPMT   Chest x-ray: 02/03/2011 No active disease   Assessment/Plan:  Administrations This Visit    Certolizumab Pegol KIT 400 mg    Admin Date 05/03/2020 Action Given Dose 400 mg Route Subcutaneous Administered By Earnestine Mealing, CMA         Patient tolerated injection well.   Patient will update labs today at [facility] [address] (lab orders have been released). Patient is scheduled for next injection on 05/31/2020. Patient is to call the office if she is running a fever with signs/symptoms of infection, on antibiotics for an active infection or has an upcoming invasive procedure.   All questions encouraged and answered.  Instructed patient to call with any further questions or concerns.

## 2020-05-04 LAB — CBC WITH DIFFERENTIAL/PLATELET
Absolute Monocytes: 631 cells/uL (ref 200–950)
Basophils Absolute: 53 cells/uL (ref 0–200)
Basophils Relative: 0.7 %
Eosinophils Absolute: 137 cells/uL (ref 15–500)
Eosinophils Relative: 1.8 %
HCT: 42.6 % (ref 35.0–45.0)
Hemoglobin: 14.1 g/dL (ref 11.7–15.5)
Lymphs Abs: 2873 cells/uL (ref 850–3900)
MCH: 28.4 pg (ref 27.0–33.0)
MCHC: 33.1 g/dL (ref 32.0–36.0)
MCV: 85.9 fL (ref 80.0–100.0)
MPV: 11.9 fL (ref 7.5–12.5)
Monocytes Relative: 8.3 %
Neutro Abs: 3906 cells/uL (ref 1500–7800)
Neutrophils Relative %: 51.4 %
Platelets: 206 10*3/uL (ref 140–400)
RBC: 4.96 10*6/uL (ref 3.80–5.10)
RDW: 13.5 % (ref 11.0–15.0)
Total Lymphocyte: 37.8 %
WBC: 7.6 10*3/uL (ref 3.8–10.8)

## 2020-05-04 LAB — COMPLETE METABOLIC PANEL WITH GFR
AG Ratio: 1.6 (calc) (ref 1.0–2.5)
ALT: 16 U/L (ref 6–29)
AST: 17 U/L (ref 10–35)
Albumin: 4.3 g/dL (ref 3.6–5.1)
Alkaline phosphatase (APISO): 70 U/L (ref 37–153)
BUN: 14 mg/dL (ref 7–25)
CO2: 26 mmol/L (ref 20–32)
Calcium: 9.5 mg/dL (ref 8.6–10.4)
Chloride: 107 mmol/L (ref 98–110)
Creat: 0.91 mg/dL (ref 0.50–0.99)
GFR, Est African American: 77 mL/min/{1.73_m2} (ref >=60)
GFR, Est Non African American: 66 mL/min/{1.73_m2} (ref >=60)
Globulin: 2.7 g/dL (calc) (ref 1.9–3.7)
Glucose, Bld: 97 mg/dL (ref 65–139)
Potassium: 3.9 mmol/L (ref 3.5–5.3)
Sodium: 142 mmol/L (ref 135–146)
Total Bilirubin: 0.6 mg/dL (ref 0.2–1.2)
Total Protein: 7 g/dL (ref 6.1–8.1)

## 2020-05-04 NOTE — Progress Notes (Signed)
CBC and CMP are normal.

## 2020-05-10 ENCOUNTER — Encounter: Payer: Self-pay | Admitting: Rheumatology

## 2020-05-10 ENCOUNTER — Other Ambulatory Visit: Payer: Self-pay

## 2020-05-10 ENCOUNTER — Ambulatory Visit (INDEPENDENT_AMBULATORY_CARE_PROVIDER_SITE_OTHER): Payer: Medicare Other | Admitting: Rheumatology

## 2020-05-10 VITALS — BP 133/88 | HR 84 | Ht 62.0 in | Wt 181.0 lb

## 2020-05-10 DIAGNOSIS — L409 Psoriasis, unspecified: Secondary | ICD-10-CM

## 2020-05-10 DIAGNOSIS — Z8669 Personal history of other diseases of the nervous system and sense organs: Secondary | ICD-10-CM

## 2020-05-10 DIAGNOSIS — R29898 Other symptoms and signs involving the musculoskeletal system: Secondary | ICD-10-CM

## 2020-05-10 DIAGNOSIS — M5030 Other cervical disc degeneration, unspecified cervical region: Secondary | ICD-10-CM

## 2020-05-10 DIAGNOSIS — Z8719 Personal history of other diseases of the digestive system: Secondary | ICD-10-CM

## 2020-05-10 DIAGNOSIS — L4050 Arthropathic psoriasis, unspecified: Secondary | ICD-10-CM | POA: Diagnosis not present

## 2020-05-10 DIAGNOSIS — Z79899 Other long term (current) drug therapy: Secondary | ICD-10-CM | POA: Diagnosis not present

## 2020-05-10 NOTE — Patient Instructions (Signed)
Standing Labs We placed an order today for your standing lab work.   Please have your standing labs drawn in June and every 3 months  If possible, please have your labs drawn 2 weeks prior to your appointment so that the provider can discuss your results at your appointment.  We have open lab daily Monday through Thursday from 1:30-4:30 PM and Friday from 1:30-4:00 PM at the office of Dr. Bo Merino, [facility].   Please be advised, all patients with office appointments requiring lab work will take precedents over walk-in lab work.  If possible, please come for your lab work on Monday and Friday afternoons, as you may experience shorter wait times. The office is located at [address], [place], [place], [place] [address] No appointment is necessary.   Labs are drawn by [facility]. Please bring your co-pay at the time of your lab draw.  You may receive a bill from [facility] for your lab work.  If you wish to have your labs drawn at another location, please call the office 24 hours in advance to send orders.  If you have any questions regarding directions or hours of operation,  please call [phone].   As a reminder, please drink plenty of water prior to coming for your lab work. Thanks!   COVID-19 vaccine recommendations:   COVID-19 vaccine is recommended for everyone (unless you are allergic to a vaccine component), even if you are on a medication that suppresses your immune system.   If you are on Methotrexate, Cellcept (mycophenolate), Rinvoq, Morrie Sheldon, and Olumiant- hold the medication for 1 week after each vaccine. Hold Methotrexate for 2 weeks after the single dose COVID-19 vaccine.   The recommendations are that the patients on immunosuppressive agents should get first 3 COVID-19 vaccination 1 month apart and then a fourth dose (booster) 3 months after the third dose.  Do not take Tylenol or any anti-inflammatory medications (NSAIDs) 24 hours prior to the  COVID-19 vaccination.   There is no direct evidence about the efficacy of the COVID-19 vaccine in individuals who are on medications that suppress the immune system.   Even if you are fully vaccinated, and you are on any medications that suppress your immune system, please continue to wear a mask, maintain at least six feet social distance and practice hand hygiene.   If you develop a COVID-19 infection, please contact your PCP or our office to determine if you need monoclonal antibody infusion.  The booster vaccine is now available for immunocompromised patients.   Please see the following web sites for updated information.   [email or web address]   Vaccines You are taking a medication(s) that can suppress your immune system.  The following immunizations are recommended: . Flu annually . Covid-19  . Pneumonia (Pneumovax 23 and Prevnar 13 spaced at least 1 year apart) . Shingrix (after age 66)  Please check with your PCP to make sure you are up to date  . Heart Disease Prevention   Your inflammatory disease increases your risk of heart disease which includes heart attack, stroke, atrial fibrillation (irregular heartbeats), high blood pressure, heart failure and atherosclerosis (plaque in the arteries).  It is important to reduce your risk by:   . Keep blood pressure, cholesterol, and blood sugar at healthy levels   . Smoking Cessation   . Maintain a healthy weight  o BMI 20-25   . Eat a healthy diet  o Plenty of fresh fruit, vegetables, and whole grains  o Limit saturated  fats, foods high in sodium, and added sugars  o DASH and Mediterranean diet   . Increase physical activity  o Recommend moderate physically activity for 150 minutes per week/ 30 minutes a day for five days a week These can be broken up into three separate ten-minute sessions during the day.   . Reduce Stress  . Meditation, slow breathing  exercises, yoga, coloring books  . Dental visits twice a year

## 2020-05-25 ENCOUNTER — Other Ambulatory Visit: Payer: Self-pay

## 2020-05-25 ENCOUNTER — Ambulatory Visit (INDEPENDENT_AMBULATORY_CARE_PROVIDER_SITE_OTHER): Payer: Medicare Other

## 2020-05-25 ENCOUNTER — Other Ambulatory Visit: Payer: Self-pay | Admitting: Nurse Practitioner

## 2020-05-25 DIAGNOSIS — M8589 Other specified disorders of bone density and structure, multiple sites: Secondary | ICD-10-CM | POA: Diagnosis not present

## 2020-05-25 DIAGNOSIS — Z780 Asymptomatic menopausal state: Secondary | ICD-10-CM

## 2020-05-31 ENCOUNTER — Other Ambulatory Visit: Payer: Self-pay

## 2020-05-31 ENCOUNTER — Ambulatory Visit (INDEPENDENT_AMBULATORY_CARE_PROVIDER_SITE_OTHER): Payer: Medicare Other

## 2020-05-31 VITALS — BP 149/90 | HR 80

## 2020-05-31 DIAGNOSIS — L4050 Arthropathic psoriasis, unspecified: Secondary | ICD-10-CM | POA: Diagnosis not present

## 2020-05-31 MED ORDER — CERTOLIZUMAB PEGOL 2 X 200 MG ~~LOC~~ KIT
400.0000 mg | PACK | Freq: Once | SUBCUTANEOUS | Status: AC
  Administered 2020-05-31: 400 mg via SUBCUTANEOUS

## 2020-05-31 NOTE — Progress Notes (Signed)
Pharmacy Note  Subjective:   Patient presents to clinic today to receive monthly dose of Cimzia.  Patient running a fever or have signs/symptoms of infection? No  Patient currently on antibiotics for the treatment of infection? No  Patient have any upcoming invasive procedures/surgeries? No  Objective: CMP     Component Value Date/Time   NA 142 05/03/2020 1324   K 3.9 05/03/2020 1324   CL 107 05/03/2020 1324   CO2 26 05/03/2020 1324   GLUCOSE 97 05/03/2020 1324   BUN 14 05/03/2020 1324   CREATININE 0.91 05/03/2020 1324   CALCIUM 9.5 05/03/2020 1324   PROT 7.0 05/03/2020 1324   ALBUMIN 4.4 08/07/2016 0911   AST 17 05/03/2020 1324   ALT 16 05/03/2020 1324   ALKPHOS 82 08/07/2016 0911   BILITOT 0.6 05/03/2020 1324   GFRNONAA 66 05/03/2020 1324   GFRAA 77 05/03/2020 1324    CBC    Component Value Date/Time   WBC 7.6 05/03/2020 1324   RBC 4.96 05/03/2020 1324   HGB 14.1 05/03/2020 1324   HGB 14.4 11/25/2013 0943   HCT 42.6 05/03/2020 1324   PLT 206 05/03/2020 1324   MCV 85.9 05/03/2020 1324   MCH 28.4 05/03/2020 1324   MCHC 33.1 05/03/2020 1324   RDW 13.5 05/03/2020 1324   LYMPHSABS 2,873 05/03/2020 1324   MONOABS 560 08/07/2016 0911   EOSABS 137 05/03/2020 1324   BASOSABS 53 05/03/2020 1324    Baseline Immunosuppressant Therapy Labs TB GOLD Quantiferon TB Gold Latest Ref Rng & Units 08/27/2019  Quantiferon TB Gold Plus NEGATIVE NEGATIVE   Hepatitis Panel Hepatitis Latest Ref Rng & Units 08/07/2016  Hep B Surface Ag NEGATIVE NEGATIVE  Hep B IgM NON REACTIVE NON REACTIVE  Hep C Ab NEGATIVE NEGATIVE   HIV Lab Results  Component Value Date   HIV NONREACTIVE 08/07/2016   Immunoglobulins Immunoglobulin Electrophoresis Latest Ref Rng & Units 08/07/2016  IgG 694 - 1,618 mg/dL 1,114  IgM 48 - 271 mg/dL 151   SPEP Serum Protein Electrophoresis Latest Ref Rng & Units 05/03/2020  Total Protein 6.1 - 8.1 g/dL 7.0  Albumin 3.8 - 4.8 g/dL -  Alpha-1 0.2 - 0.3 g/dL  -  Alpha-2 0.5 - 0.9 g/dL -  Beta Globulin 0.4 - 0.6 g/dL -  Beta 2 0.2 - 0.5 g/dL -  Gamma Globulin 0.8 - 1.7 g/dL -  Interpretation - -   G6PD No results found for: G6PDH TPMT No results found for: TPMT   Chest x-ray: 02/03/2011 No active disease   Assessment/Plan:  Administrations This Visit    Certolizumab Pegol KIT 400 mg    Admin Date 05/31/2020 Action Given Dose 400 mg Route Subcutaneous Administered By Earnestine Mealing, CMA          Patient tolerated injection well.   Patient is scheduled for next injection on 06/28/2020. Labs were updated on 05/03/2020. Patient is to call the office if she is running a fever with signs/symptoms of infection, on antibiotics for an active infection or has an upcoming invasive procedure.   All questions encouraged and answered.  Instructed patient to call with any further questions or concerns.

## 2020-06-28 ENCOUNTER — Other Ambulatory Visit: Payer: Self-pay

## 2020-06-28 ENCOUNTER — Ambulatory Visit (INDEPENDENT_AMBULATORY_CARE_PROVIDER_SITE_OTHER): Payer: Medicare Other

## 2020-06-28 VITALS — BP 142/88 | HR 76

## 2020-06-28 DIAGNOSIS — L4059 Other psoriatic arthropathy: Secondary | ICD-10-CM | POA: Diagnosis not present

## 2020-06-28 DIAGNOSIS — L4050 Arthropathic psoriasis, unspecified: Secondary | ICD-10-CM

## 2020-06-28 MED ORDER — CERTOLIZUMAB PEGOL 2 X 200 MG ~~LOC~~ KIT
400.0000 mg | PACK | Freq: Once | SUBCUTANEOUS | Status: AC
  Administered 2020-06-28: 400 mg via SUBCUTANEOUS

## 2020-06-28 NOTE — Progress Notes (Signed)
Pharmacy Note  Subjective:   Patient presents to clinic today to receive monthly dose of Cimzia.  Patient running a fever or have signs/symptoms of infection? No  Patient currently on antibiotics for the treatment of infection? No  Patient have any upcoming invasive procedures/surgeries? No  Objective: CMP     Component Value Date/Time   NA 142 05/03/2020 1324   K 3.9 05/03/2020 1324   CL 107 05/03/2020 1324   CO2 26 05/03/2020 1324   GLUCOSE 97 05/03/2020 1324   BUN 14 05/03/2020 1324   CREATININE 0.91 05/03/2020 1324   CALCIUM 9.5 05/03/2020 1324   PROT 7.0 05/03/2020 1324   ALBUMIN 4.4 08/07/2016 0911   AST 17 05/03/2020 1324   ALT 16 05/03/2020 1324   ALKPHOS 82 08/07/2016 0911   BILITOT 0.6 05/03/2020 1324   GFRNONAA 66 05/03/2020 1324   GFRAA 77 05/03/2020 1324    CBC    Component Value Date/Time   WBC 7.6 05/03/2020 1324   RBC 4.96 05/03/2020 1324   HGB 14.1 05/03/2020 1324   HGB 14.4 11/25/2013 0943   HCT 42.6 05/03/2020 1324   PLT 206 05/03/2020 1324   MCV 85.9 05/03/2020 1324   MCH 28.4 05/03/2020 1324   MCHC 33.1 05/03/2020 1324   RDW 13.5 05/03/2020 1324   LYMPHSABS 2,873 05/03/2020 1324   MONOABS 560 08/07/2016 0911   EOSABS 137 05/03/2020 1324   BASOSABS 53 05/03/2020 1324    Baseline Immunosuppressant Therapy Labs TB GOLD Quantiferon TB Gold Latest Ref Rng & Units 08/27/2019  Quantiferon TB Gold Plus NEGATIVE NEGATIVE   Hepatitis Panel Hepatitis Latest Ref Rng & Units 08/07/2016  Hep B Surface Ag NEGATIVE NEGATIVE  Hep B IgM NON REACTIVE NON REACTIVE  Hep C Ab NEGATIVE NEGATIVE   HIV Lab Results  Component Value Date   HIV NONREACTIVE 08/07/2016   Immunoglobulins Immunoglobulin Electrophoresis Latest Ref Rng & Units 08/07/2016  IgG 694 - 1,618 mg/dL 1,114  IgM 48 - 271 mg/dL 151   SPEP Serum Protein Electrophoresis Latest Ref Rng & Units 05/03/2020  Total Protein 6.1 - 8.1 g/dL 7.0  Albumin 3.8 - 4.8 g/dL -  Alpha-1 0.2 - 0.3 g/dL  -  Alpha-2 0.5 - 0.9 g/dL -  Beta Globulin 0.4 - 0.6 g/dL -  Beta 2 0.2 - 0.5 g/dL -  Gamma Globulin 0.8 - 1.7 g/dL -  Interpretation - -   G6PD No results found for: G6PDH TPMT No results found for: TPMT   Chest x-ray: 12/28/2012No active disease  Assessment/Plan:   Administrations This Visit    Certolizumab Pegol KIT 400 mg    Admin Date 06/28/2020 Action Given Dose 400 mg Route Subcutaneous Administered By Earnestine Mealing, CMA         Patient tolerated injection well.   Patient is scheduled for next injection on 07/26/2020. Labs were updated on 05/03/2020. Patient will update labs at scheduled nurse visit on 07/26/2020. Patient is to call the office if she is running a fever with signs/symptoms of infection, on antibiotics for an active infection or has an upcoming invasive procedure.  All questions encouraged and answered. Instructed patient to call with any further questions or concerns.

## 2020-07-07 ENCOUNTER — Other Ambulatory Visit: Payer: Self-pay | Admitting: Physician Assistant

## 2020-07-07 NOTE — Telephone Encounter (Signed)
Next Visit: 10/18/2020  Last Visit: 05/10/2020  Last Fill: 04/29/2020  DX:  Psoriatic arthropathy   Current Dose per office note 05/10/2020, Methotrexate 7 tablets by mouth once weekly (4 on saturdays and 3 tablets on sundays)  Labs: 05/03/2020, CBC and CMP are normal.  Okay to refill MTX?

## 2020-07-26 ENCOUNTER — Other Ambulatory Visit: Payer: Self-pay

## 2020-07-26 ENCOUNTER — Ambulatory Visit (INDEPENDENT_AMBULATORY_CARE_PROVIDER_SITE_OTHER): Payer: Medicare Other | Admitting: *Deleted

## 2020-07-26 VITALS — BP 138/89 | HR 72

## 2020-07-26 DIAGNOSIS — Z79899 Other long term (current) drug therapy: Secondary | ICD-10-CM

## 2020-07-26 DIAGNOSIS — L4059 Other psoriatic arthropathy: Secondary | ICD-10-CM

## 2020-07-26 DIAGNOSIS — L4050 Arthropathic psoriasis, unspecified: Secondary | ICD-10-CM

## 2020-07-26 MED ORDER — CERTOLIZUMAB PEGOL 2 X 200 MG ~~LOC~~ KIT
400.0000 mg | PACK | Freq: Once | SUBCUTANEOUS | Status: AC
  Administered 2020-07-26: 400 mg via SUBCUTANEOUS

## 2020-07-26 NOTE — Progress Notes (Signed)
Pharmacy Note  Subjective:   Patient presents to clinic today to receive monthly dose of Cimzia.  Patient running a fever or have signs/symptoms of infection? No  Patient currently on antibiotics for the treatment of infection? No  Patient have any upcoming invasive procedures/surgeries? No  Objective: CMP     Component Value Date/Time   NA 142 05/03/2020 1324   K 3.9 05/03/2020 1324   CL 107 05/03/2020 1324   CO2 26 05/03/2020 1324   GLUCOSE 97 05/03/2020 1324   BUN 14 05/03/2020 1324   CREATININE 0.91 05/03/2020 1324   CALCIUM 9.5 05/03/2020 1324   PROT 7.0 05/03/2020 1324   ALBUMIN 4.4 08/07/2016 0911   AST 17 05/03/2020 1324   ALT 16 05/03/2020 1324   ALKPHOS 82 08/07/2016 0911   BILITOT 0.6 05/03/2020 1324   GFRNONAA 66 05/03/2020 1324   GFRAA 77 05/03/2020 1324    CBC    Component Value Date/Time   WBC 7.6 05/03/2020 1324   RBC 4.96 05/03/2020 1324   HGB 14.1 05/03/2020 1324   HGB 14.4 11/25/2013 0943   HCT 42.6 05/03/2020 1324   PLT 206 05/03/2020 1324   MCV 85.9 05/03/2020 1324   MCH 28.4 05/03/2020 1324   MCHC 33.1 05/03/2020 1324   RDW 13.5 05/03/2020 1324   LYMPHSABS 2,873 05/03/2020 1324   MONOABS 560 08/07/2016 0911   EOSABS 137 05/03/2020 1324   BASOSABS 53 05/03/2020 1324    Baseline Immunosuppressant Therapy Labs TB GOLD Quantiferon TB Gold Latest Ref Rng & Units 08/27/2019  Quantiferon TB Gold Plus NEGATIVE NEGATIVE   Hepatitis Panel Hepatitis Latest Ref Rng & Units 08/07/2016  Hep B Surface Ag NEGATIVE NEGATIVE  Hep B IgM NON REACTIVE NON REACTIVE  Hep C Ab NEGATIVE NEGATIVE   HIV Lab Results  Component Value Date   HIV NONREACTIVE 08/07/2016   Immunoglobulins Immunoglobulin Electrophoresis Latest Ref Rng & Units 08/07/2016  IgG 694 - 1,618 mg/dL 1,114  IgM 48 - 271 mg/dL 151   SPEP Serum Protein Electrophoresis Latest Ref Rng & Units 05/03/2020  Total Protein 6.1 - 8.1 g/dL 7.0  Albumin 3.8 - 4.8 g/dL -  Alpha-1 0.2 - 0.3 g/dL  -  Alpha-2 0.5 - 0.9 g/dL -  Beta Globulin 0.4 - 0.6 g/dL -  Beta 2 0.2 - 0.5 g/dL -  Gamma Globulin 0.8 - 1.7 g/dL -  Interpretation - -   G6PD No results found for: G6PDH TPMT No results found for: TPMT   Chest x-ray: 02/03/2011 No active disease   Assessment/Plan:   Administrations This Visit     Certolizumab Pegol KIT 400 mg     Admin Date 07/26/2020 Action Given Dose 400 mg Route Subcutaneous Administered By Carole Binning, LPN             Patient tolerated injection well.   Appointment for next injection scheduled for 08/23/2020.  Patient due for labs in today and then again in 3 months.  Patient is to call and reschedule appointment if running a fever with signs/symptoms of infection, on antibiotics for active infection or has an upcoming invasive procedure.  All questions encouraged and answered.  Instructed patient to call with any further questions or concerns.

## 2020-07-27 LAB — COMPLETE METABOLIC PANEL WITH GFR
AG Ratio: 1.7 (calc) (ref 1.0–2.5)
ALT: 16 U/L (ref 6–29)
AST: 16 U/L (ref 10–35)
Albumin: 4.4 g/dL (ref 3.6–5.1)
Alkaline phosphatase (APISO): 78 U/L (ref 37–153)
BUN: 14 mg/dL (ref 7–25)
CO2: 26 mmol/L (ref 20–32)
Calcium: 9.2 mg/dL (ref 8.6–10.4)
Chloride: 107 mmol/L (ref 98–110)
Creat: 0.84 mg/dL (ref 0.50–0.99)
GFR, Est African American: 85 mL/min/{1.73_m2} (ref >=60)
GFR, Est Non African American: 73 mL/min/{1.73_m2} (ref >=60)
Globulin: 2.6 g/dL (calc) (ref 1.9–3.7)
Glucose, Bld: 85 mg/dL (ref 65–99)
Potassium: 4.2 mmol/L (ref 3.5–5.3)
Sodium: 141 mmol/L (ref 135–146)
Total Bilirubin: 0.5 mg/dL (ref 0.2–1.2)
Total Protein: 7 g/dL (ref 6.1–8.1)

## 2020-07-27 LAB — CBC WITH DIFFERENTIAL/PLATELET
Absolute Monocytes: 656 cells/uL (ref 200–950)
Basophils Absolute: 47 cells/uL (ref 0–200)
Basophils Relative: 0.6 %
Eosinophils Absolute: 150 cells/uL (ref 15–500)
Eosinophils Relative: 1.9 %
HCT: 42.8 % (ref 35.0–45.0)
Hemoglobin: 14.1 g/dL (ref 11.7–15.5)
Lymphs Abs: 2315 cells/uL (ref 850–3900)
MCH: 28.4 pg (ref 27.0–33.0)
MCHC: 32.9 g/dL (ref 32.0–36.0)
MCV: 86.3 fL (ref 80.0–100.0)
MPV: 11.6 fL (ref 7.5–12.5)
Monocytes Relative: 8.3 %
Neutro Abs: 4732 cells/uL (ref 1500–7800)
Neutrophils Relative %: 59.9 %
Platelets: 203 10*3/uL (ref 140–400)
RBC: 4.96 10*6/uL (ref 3.80–5.10)
RDW: 13.8 % (ref 11.0–15.0)
Total Lymphocyte: 29.3 %
WBC: 7.9 10*3/uL (ref 3.8–10.8)

## 2020-07-27 NOTE — Progress Notes (Signed)
CBC and CMP are normal.

## 2020-07-30 IMAGING — MG DIGITAL SCREENING BILATERAL MAMMOGRAM WITH TOMO AND CAD
6 of 10 series · 6 of 30 positions shown · non-contrast
Comparison: Previous exam(s).

CLINICAL DATA: Screening.

EXAM:
DIGITAL SCREENING BILATERAL MAMMOGRAM WITH TOMO AND CAD

[L MLO synth-2D (1 of 2)]
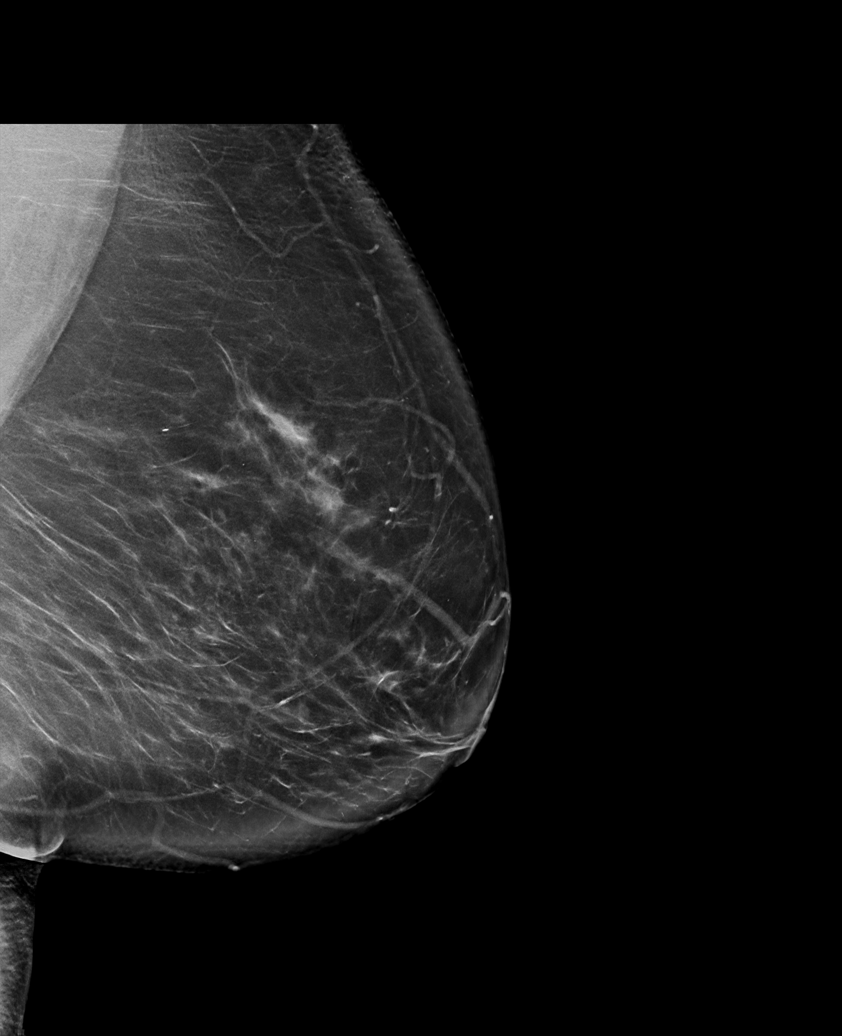

[L CC synth-2D]
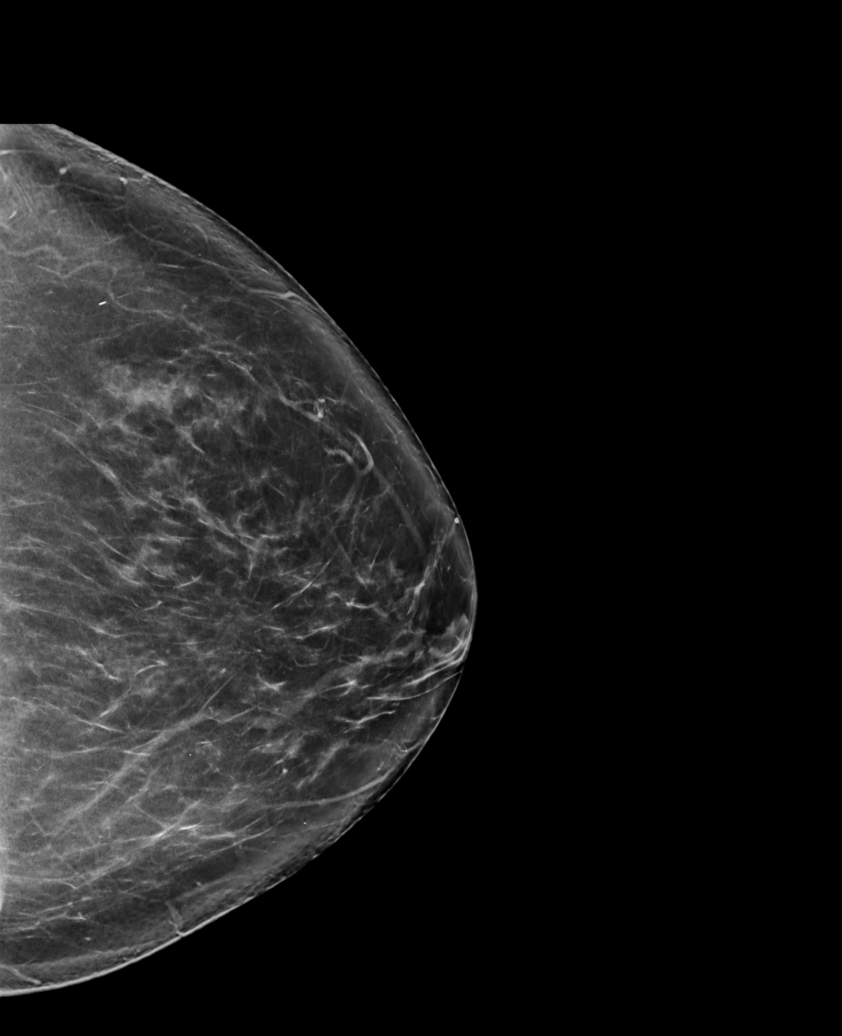

[L MLO synth-2D (2 of 2)]
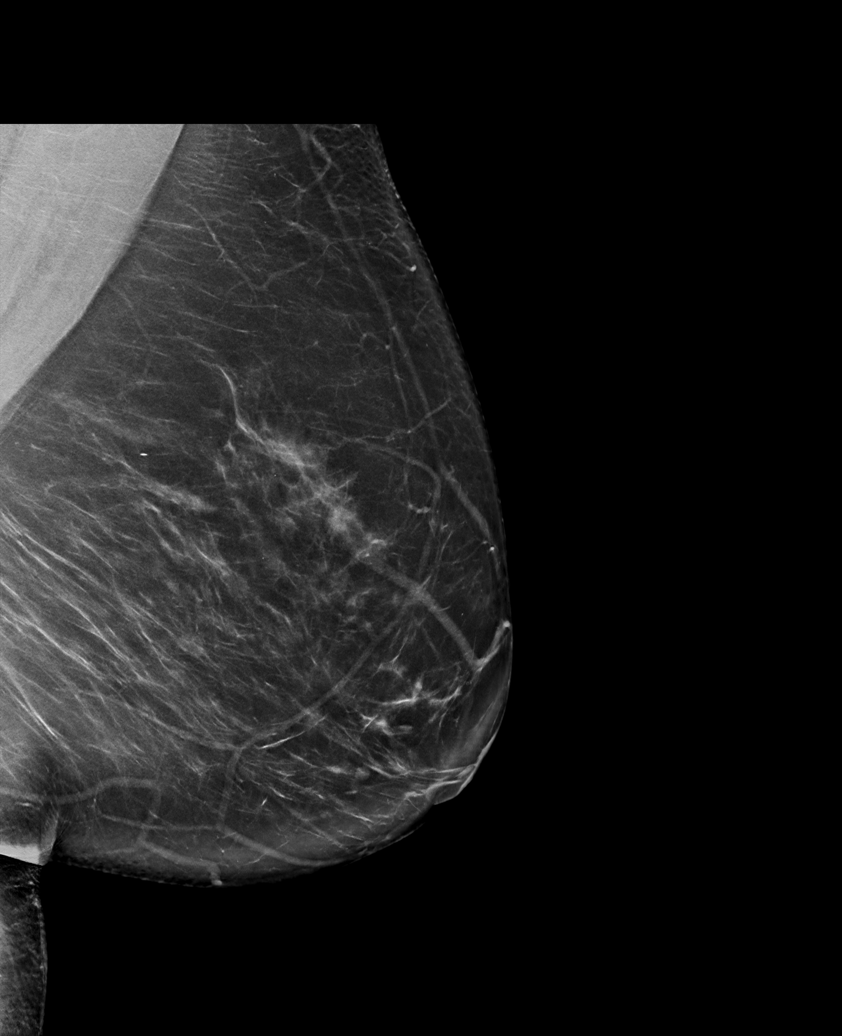

[R CC synth-2D]
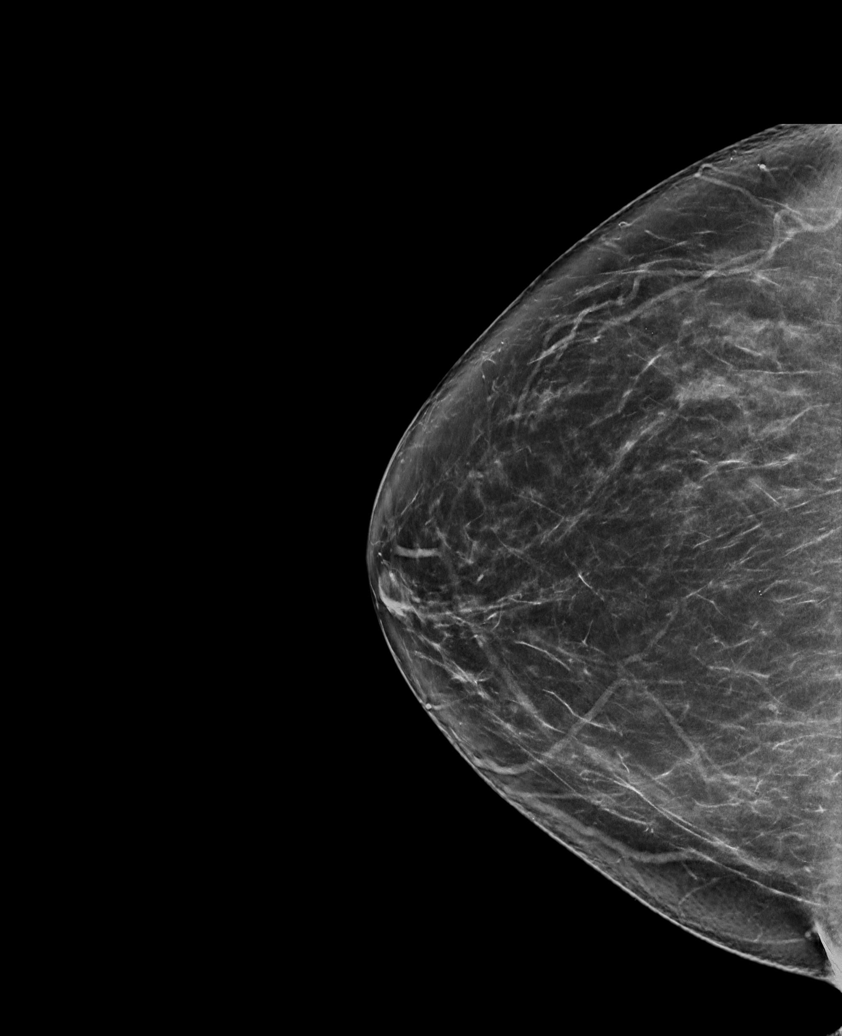

[R MLO synth-2D]
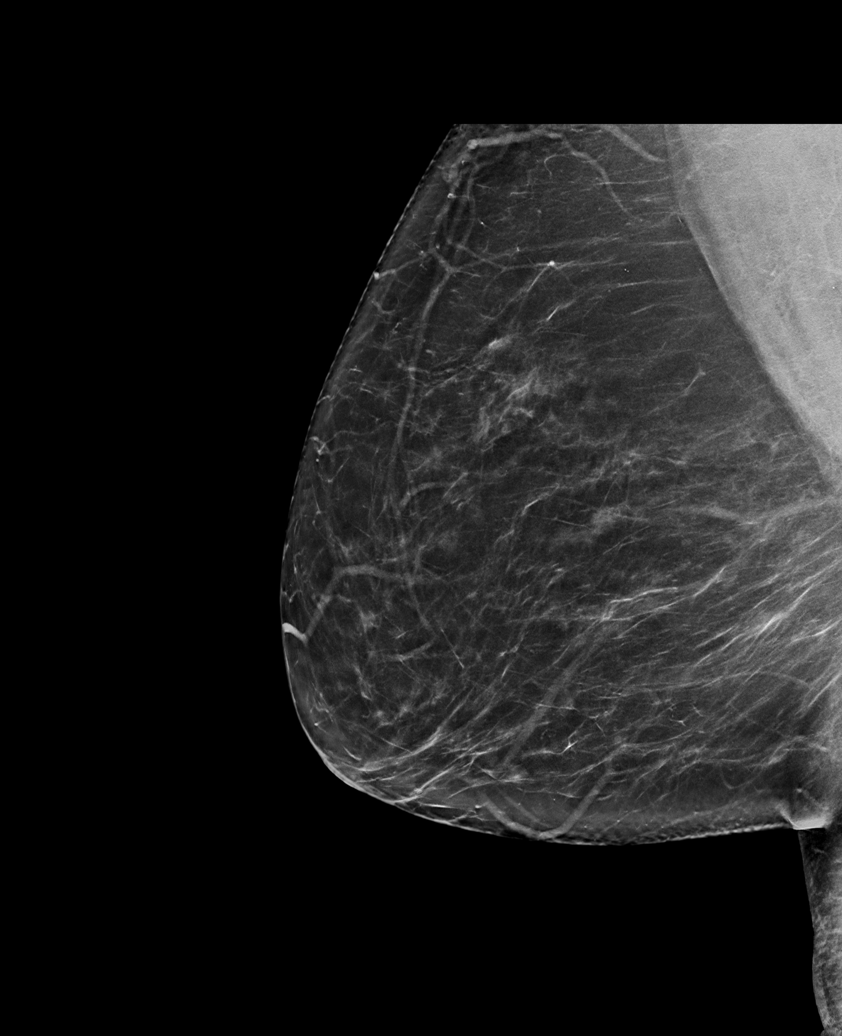

[L MLO tomo · tomo slice 51/101.0]
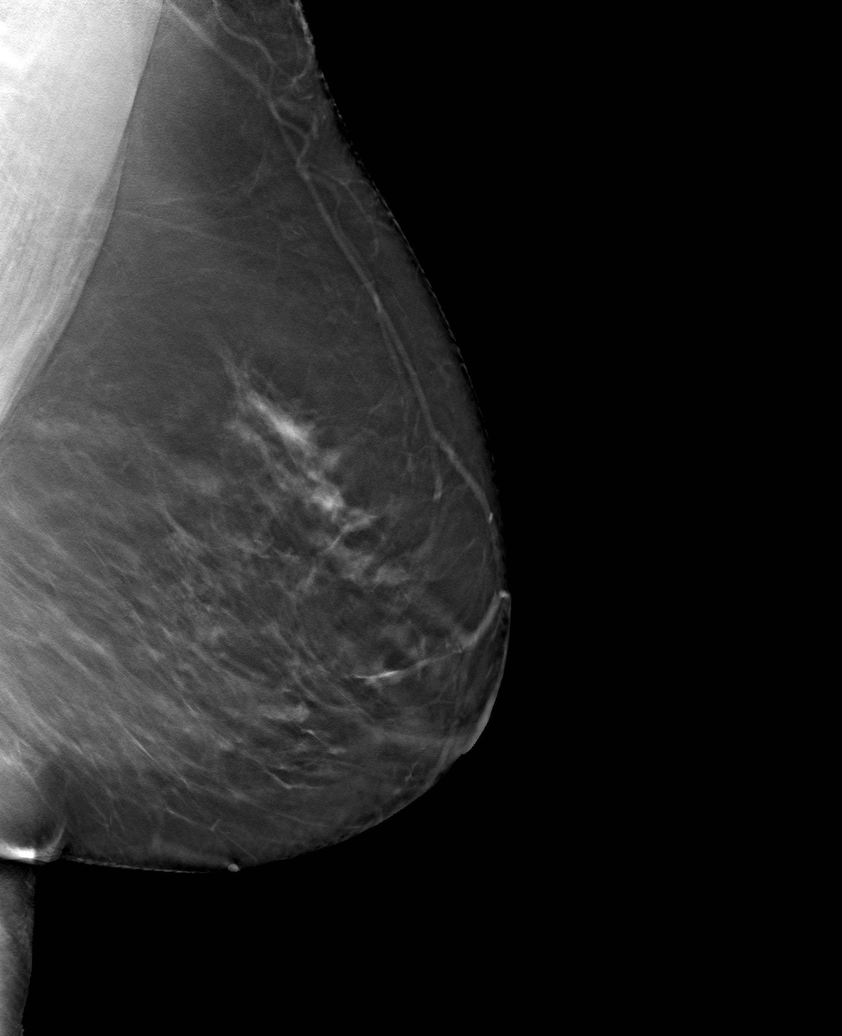

[6 of 30 positions shown; findings below may reference images not displayed]

ACR Breast Density Category b: There are scattered areas of
fibroglandular density.
FINDINGS: There are no findings suspicious for malignancy. Images were
processed with CAD.
IMPRESSION: No mammographic evidence of malignancy. A result letter of this
screening mammogram will be mailed directly to the patient.

RECOMMENDATION:
Screening mammogram in one year. (Code:[id number])

BI-RADS CATEGORY  1: Negative.

## 2020-08-23 ENCOUNTER — Other Ambulatory Visit: Payer: Self-pay

## 2020-08-23 ENCOUNTER — Ambulatory Visit (INDEPENDENT_AMBULATORY_CARE_PROVIDER_SITE_OTHER): Payer: Medicare Other | Admitting: *Deleted

## 2020-08-23 VITALS — BP 135/84 | HR 85

## 2020-08-23 DIAGNOSIS — L4059 Other psoriatic arthropathy: Secondary | ICD-10-CM

## 2020-08-23 DIAGNOSIS — L4050 Arthropathic psoriasis, unspecified: Secondary | ICD-10-CM

## 2020-08-23 MED ORDER — CERTOLIZUMAB PEGOL 2 X 200 MG ~~LOC~~ KIT
400.0000 mg | PACK | Freq: Once | SUBCUTANEOUS | Status: AC
  Administered 2020-08-23: 400 mg via SUBCUTANEOUS

## 2020-08-23 NOTE — Progress Notes (Signed)
Pharmacy Note  Subjective:   Patient presents to clinic today to receive monthly dose of Cimzia.  Patient running a fever or have signs/symptoms of infection? No  Patient currently on antibiotics for the treatment of infection? No  Patient have any upcoming invasive procedures/surgeries? No  Objective: CMP     Component Value Date/Time   NA 141 07/26/2020 1421   K 4.2 07/26/2020 1421   CL 107 07/26/2020 1421   CO2 26 07/26/2020 1421   GLUCOSE 85 07/26/2020 1421   BUN 14 07/26/2020 1421   CREATININE 0.84 07/26/2020 1421   CALCIUM 9.2 07/26/2020 1421   PROT 7.0 07/26/2020 1421   ALBUMIN 4.4 08/07/2016 0911   AST 16 07/26/2020 1421   ALT 16 07/26/2020 1421   ALKPHOS 82 08/07/2016 0911   BILITOT 0.5 07/26/2020 1421   GFRNONAA 73 07/26/2020 1421   GFRAA 85 07/26/2020 1421    CBC    Component Value Date/Time   WBC 7.9 07/26/2020 1421   RBC 4.96 07/26/2020 1421   HGB 14.1 07/26/2020 1421   HGB 14.4 11/25/2013 0943   HCT 42.8 07/26/2020 1421   PLT 203 07/26/2020 1421   MCV 86.3 07/26/2020 1421   MCH 28.4 07/26/2020 1421   MCHC 32.9 07/26/2020 1421   RDW 13.8 07/26/2020 1421   LYMPHSABS 2,315 07/26/2020 1421   MONOABS 560 08/07/2016 0911   EOSABS 150 07/26/2020 1421   BASOSABS 47 07/26/2020 1421    Baseline Immunosuppressant Therapy Labs TB GOLD Quantiferon TB Gold Latest Ref Rng & Units 08/27/2019  Quantiferon TB Gold Plus NEGATIVE NEGATIVE   Hepatitis Panel Hepatitis Latest Ref Rng & Units 08/07/2016  Hep B Surface Ag NEGATIVE NEGATIVE  Hep B IgM NON REACTIVE NON REACTIVE  Hep C Ab NEGATIVE NEGATIVE   HIV Lab Results  Component Value Date   HIV NONREACTIVE 08/07/2016   Immunoglobulins Immunoglobulin Electrophoresis Latest Ref Rng & Units 08/07/2016  IgG 694 - 1,618 mg/dL 1,114  IgM 48 - 271 mg/dL 151   SPEP Serum Protein Electrophoresis Latest Ref Rng & Units 07/26/2020  Total Protein 6.1 - 8.1 g/dL 7.0  Albumin 3.8 - 4.8 g/dL -  Alpha-1 0.2 - 0.3 g/dL  -  Alpha-2 0.5 - 0.9 g/dL -  Beta Globulin 0.4 - 0.6 g/dL -  Beta 2 0.2 - 0.5 g/dL -  Gamma Globulin 0.8 - 1.7 g/dL -  Interpretation - -   G6PD No results found for: G6PDH TPMT No results found for: TPMT   Chest x-ray: 02/03/2011 No active disease   Assessment/Plan:   Administrations This Visit     Certolizumab Pegol KIT 400 mg     Admin Date 08/23/2020 Action Given Dose 400 mg Route Subcutaneous Administered By ,  L, LPN             Patient tolerated injection well.   Appointment for next injection scheduled for 09/20/2020.  Patient due for labs in September 2022.  Patient is to call and reschedule appointment if running a fever with signs/symptoms of infection, on antibiotics for active infection or has an upcoming invasive procedure.  All questions encouraged and answered.  Instructed patient to call with any further questions or concerns.   

## 2020-09-20 ENCOUNTER — Other Ambulatory Visit: Payer: Self-pay

## 2020-09-20 ENCOUNTER — Ambulatory Visit (INDEPENDENT_AMBULATORY_CARE_PROVIDER_SITE_OTHER): Payer: Medicare Other | Admitting: *Deleted

## 2020-09-20 VITALS — BP 122/86 | HR 77

## 2020-09-20 DIAGNOSIS — L4059 Other psoriatic arthropathy: Secondary | ICD-10-CM | POA: Diagnosis not present

## 2020-09-20 DIAGNOSIS — L4050 Arthropathic psoriasis, unspecified: Secondary | ICD-10-CM

## 2020-09-20 MED ORDER — CERTOLIZUMAB PEGOL 2 X 200 MG ~~LOC~~ KIT
400.0000 mg | PACK | Freq: Once | SUBCUTANEOUS | Status: AC
  Administered 2020-09-20: 400 mg via SUBCUTANEOUS

## 2020-09-20 NOTE — Progress Notes (Signed)
Pharmacy Note  Subjective:   Patient presents to clinic today to receive monthly dose of Cimzia.  Patient running a fever or have signs/symptoms of infection? No  Patient currently on antibiotics for the treatment of infection? No  Patient have any upcoming invasive procedures/surgeries? No  Objective: CMP     Component Value Date/Time   NA 141 07/26/2020 1421   K 4.2 07/26/2020 1421   CL 107 07/26/2020 1421   CO2 26 07/26/2020 1421   GLUCOSE 85 07/26/2020 1421   BUN 14 07/26/2020 1421   CREATININE 0.84 07/26/2020 1421   CALCIUM 9.2 07/26/2020 1421   PROT 7.0 07/26/2020 1421   ALBUMIN 4.4 08/07/2016 0911   AST 16 07/26/2020 1421   ALT 16 07/26/2020 1421   ALKPHOS 82 08/07/2016 0911   BILITOT 0.5 07/26/2020 1421   GFRNONAA 73 07/26/2020 1421   GFRAA 85 07/26/2020 1421    CBC    Component Value Date/Time   WBC 7.9 07/26/2020 1421   RBC 4.96 07/26/2020 1421   HGB 14.1 07/26/2020 1421   HGB 14.4 11/25/2013 0943   HCT 42.8 07/26/2020 1421   PLT 203 07/26/2020 1421   MCV 86.3 07/26/2020 1421   MCH 28.4 07/26/2020 1421   MCHC 32.9 07/26/2020 1421   RDW 13.8 07/26/2020 1421   LYMPHSABS 2,315 07/26/2020 1421   MONOABS 560 08/07/2016 0911   EOSABS 150 07/26/2020 1421   BASOSABS 47 07/26/2020 1421    Baseline Immunosuppressant Therapy Labs TB GOLD Quantiferon TB Gold Latest Ref Rng & Units 08/27/2019  Quantiferon TB Gold Plus NEGATIVE NEGATIVE   Hepatitis Panel Hepatitis Latest Ref Rng & Units 08/07/2016  Hep B Surface Ag NEGATIVE NEGATIVE  Hep B IgM NON REACTIVE NON REACTIVE  Hep C Ab NEGATIVE NEGATIVE   HIV Lab Results  Component Value Date   HIV NONREACTIVE 08/07/2016   Immunoglobulins Immunoglobulin Electrophoresis Latest Ref Rng & Units 08/07/2016  IgG 694 - 1,618 mg/dL 1,114  IgM 48 - 271 mg/dL 151   SPEP Serum Protein Electrophoresis Latest Ref Rng & Units 07/26/2020  Total Protein 6.1 - 8.1 g/dL 7.0  Albumin 3.8 - 4.8 g/dL -  Alpha-1 0.2 - 0.3 g/dL  -  Alpha-2 0.5 - 0.9 g/dL -  Beta Globulin 0.4 - 0.6 g/dL -  Beta 2 0.2 - 0.5 g/dL -  Gamma Globulin 0.8 - 1.7 g/dL -  Interpretation - -   G6PD No results found for: G6PDH TPMT No results found for: TPMT   Chest x-ray: 02/03/2011 No active disease   Assessment/Plan:   Administrations This Visit     Certolizumab Pegol KIT 400 mg     Admin Date 09/20/2020 Action Given Dose 400 mg Route Subcutaneous Administered By Carole Binning, LPN             Patient tolerated injection well.   Appointment for next injection scheduled for 10/18/2020.  Patient due for labs in September 2022.  Patient is to call and reschedule appointment if running a fever with signs/symptoms of infection, on antibiotics for active infection or has an upcoming invasive procedure.  All questions encouraged and answered.  Instructed patient to call with any further questions or concerns.

## 2020-10-04 NOTE — Progress Notes (Signed)
Office Visit Note  Patient: Cynthia Snyder             Date of Birth: 02-06-1956           MRN: [id number]             PCP: Chesley Noon, MD Referring: Chesley Noon, MD Visit Date: 10/18/2020 Occupation: _0 @  Subjective:  Medication monitoring   History of Present Illness: Cynthia Snyder is a 65 y.o. female with history of psoriatic arthritis.  She is on cimzia 400 mg sq injections every 28 days and methotrexate 7 tablets by mouth once weekly. She denies any signs or symptoms of a psoriatic arthritis flare.  Patient ports that she has been going to physical therapy to improve her neck pain and stiffness and has started to notice improvement in her symptoms.  She denies any symptoms of radiculopathy.  She has not had any other joint pain or joint swelling recently.  She denies any Achilles tendinitis or plantar fasciitis.  She denies any SI joint discomfort.  She has not had any active psoriasis.  She denies any recent infections. She received the annual flu shot but does not plan on receiving the covid-19 booster.      Activities of Daily Living:  Patient reports morning stiffness for 30 minutes.   Patient Reports nocturnal pain.  Difficulty dressing/grooming: Denies Difficulty climbing stairs: Denies Difficulty getting out of chair: Denies Difficulty using hands for taps, buttons, cutlery, and/or writing: Reports  Review of Systems  Constitutional:  Negative for fatigue.  HENT:  Positive for mouth dryness and nose dryness. Negative for mouth sores.   Eyes:  Negative for pain, itching and dryness.  Respiratory:  Negative for shortness of breath and difficulty breathing.   Cardiovascular:  Negative for chest pain and palpitations.  Gastrointestinal:  Negative for blood in stool, constipation and diarrhea.  Endocrine: Negative for increased urination.  Genitourinary:  Negative for difficulty urinating.  Musculoskeletal:  Positive for joint pain,  joint pain and morning stiffness. Negative for joint swelling and muscle tenderness.  Skin:  Negative for color change, rash and redness.  Allergic/Immunologic: Negative for susceptible to infections.  Neurological:  Positive for numbness. Negative for dizziness, headaches, memory loss and weakness.  Hematological:  Negative for bruising/bleeding tendency.  Psychiatric/Behavioral:  Negative for confusion.    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   Pain of right hip joint 03/26/2017   Psoriatic arthropathy ([place]) 11/07/2016   Psoriasis 08/04/2016   DJD (degenerative joint disease), cervical 08/04/2016   Esophagitis 08/04/2016   Restless leg syndrome 08/04/2016   High risk medication use 08/04/2016   Atrophic vaginitis 01/22/2015    Class: Chronic    Past Medical History:  Diagnosis Date   Abnormal Pap smear    many yrs ago   Endometrial cancer ([place])    Endometriosis    Esophagus disorder    Psoriatic arthritis ([place])    Shingles    Shingles     Family History  Problem Relation Age of Onset   Heart disease Mother    Hypertension Mother    Stroke Mother    Diabetes Mother    Osteoporosis Mother    Hypothyroidism Mother    Heart disease Father    Heart disease Brother    Stroke Brother    Cancer Paternal Aunt        ovarian  Colon cancer Maternal Grandmother    Breast cancer Neg Hx    Past Surgical History:  Procedure Laterality Date   ABDOMINAL HYSTERECTOMY     age 51   APPENDECTOMY     BILATERAL SALPINGOOPHORECTOMY Right 11/24/08   lysis of adhesion   CATARACT EXTRACTION, BILATERAL     esophageal stretched     EXPLORATORY LAPAROTOMY     jaw bone graft  2004   KNEE ARTHROPLASTY     KNEE SURGERY     LAPAROSCOPY     age 34   LIPOSUCTION TRUNK  04/06/2016   Dr. Lenon Curt for body image   LIVER BIOPSY     mild steatosis   NOSE SURGERY     Social History   Social History  Narrative   Not on file   Immunization History  Administered Date(s) Administered   Influenza Inj Mdck Quad Pf 10/23/2017   Influenza Split 10/20/2017   Influenza, Seasonal, Injecte, Preservative Fre 10/29/2013, 12/08/2014, 11/18/2015   Moderna Sars-Covid-2 Vaccination 04/28/2019, 05/29/2019   Tdap 11/06/2012   Zoster, Live 10/01/2015     Objective: Vital Signs: BP (!) 146/90 (BP Location: Left Arm, Patient Position: Sitting, Cuff Size: Normal)   Pulse 71   Ht _0  (1.575 m)   Wt 177 lb (80.3 kg)   LMP 02/06/1982   BMI 32.37 kg/m    Physical Exam Vitals and nursing note reviewed.  Constitutional:      Appearance: She is well-developed.  HENT:     Head: Normocephalic and atraumatic.  Eyes:     Conjunctiva/sclera: Conjunctivae normal.  Pulmonary:     Effort: Pulmonary effort is normal.  Abdominal:     Palpations: Abdomen is soft.  Musculoskeletal:     Cervical back: Normal range of motion.  Skin:    General: Skin is warm and dry.     Capillary Refill: Capillary refill takes less than 2 seconds.  Neurological:     Mental Status: She is alert and oriented to person, place, and time.  Psychiatric:        Behavior: Behavior normal.     Musculoskeletal Exam: C-spine has slightly limited ROM with lateral rotation.  Thoracic and lumbar spine have good ROM with no discomfort.  Shoulder joints, elbow joints, wrist joints, MCPs, PIPs, and DIPs good ROM with no synovitis.  Complete fist formation bilaterally.  PIP and DIP thickening consistent with OA of both hands.  Hip joints have good ROM with no discomfort.  Knee joints have good ROM with no discomfort.  No warmth or effusion of knee joints. Ankle joints have good ROM with some discomfort in the left ankle.  No tenderness over MTP joints.  Mild PIP and DIP thickening consistent with OA of both feet.  No evidence of achilles tendonitis or plantar fasciitis.   CDAI Exam: CDAI Score: -- Patient Global: --; Provider Global:  -- Swollen: --; Tender: -- Joint Exam 10/18/2020   No joint exam has been documented for this visit   There is currently no information documented on the homunculus. Go to the Rheumatology activity and complete the homunculus joint exam.  Investigation: No additional findings.  Imaging: No results found.  Recent Labs: Lab Results  Component Value Date   WBC 7.9 07/26/2020   HGB 14.1 07/26/2020   PLT 203 07/26/2020   NA 141 07/26/2020   K 4.2 07/26/2020   CL 107 07/26/2020   CO2 26 07/26/2020   GLUCOSE 85 07/26/2020   BUN 14 07/26/2020  CREATININE 0.84 07/26/2020   BILITOT 0.5 07/26/2020   ALKPHOS 82 08/07/2016   AST 16 07/26/2020   ALT 16 07/26/2020   PROT 7.0 07/26/2020   ALBUMIN 4.4 08/07/2016   CALCIUM 9.2 07/26/2020   GFRAA 85 07/26/2020   QFTBGOLDPLUS NEGATIVE 08/27/2019    Speciality Comments: Enbrel discontinued August 2021-not covered by Medicare Cimzia- started 12/22/2019  Procedures:  No procedures performed Allergies: Patient has no known allergies.    Assessment / Plan:     Visit Diagnoses: Psoriatic arthropathy ([place]) - She has no synovitis or dactylitis on examination.  She has not had any signs or symptoms of a psoriatic arthritis flare.  She has clinically been doing well on Cimzia 400 mg subcutaneous injections every 28 days and methotrexate 7 tablets by mouth once weekly.  She has not missed any doses of these medications recently and has not had any recent infections.  She has no evidence of Achilles tendinitis or plantar fasciitis on examination today.  No SI joint tenderness to palpation.  She is currently going to physical therapy to improve her neck pain and stiffness but has not been experiencing any other joint pain or inflammation recently.  She will continue on the current treatment regimen.  She was advised to notify us if she develops increased joint pain or joint swelling.  She will follow-up in the office in 5 months.  Plan: Certolizumab  Pegol KIT 400 mg  Psoriasis: She has no active psoriasis at this time.   High risk medication use - Cimzia 400 mg sq injections every 28 days, Methotrexate 7 tablets by mouth once weekly (4 on saturdays and 3 tablets on sundays).  D/c Enbrel-August 2021 as it was not covered.   CBC and CMP updated on 07/26/20.  She is due to update lab work.  Orders for CBC and CMP released.  Her next lab work will be due in December and every 3 months.  Standing orders for CBC and CMP are in place.  TB gold negative on 08/27/19.  Due to update TB gold.  Order released. Plan: CBC with Differential/Platelet, COMPLETE METABOLIC PANEL WITH GFR, QuantiFERON-TB Gold Plus Discussed the importance of holding cimzia and MTX if she develops signs or symptoms of an infection and to resume once the infection has completely cleared.   She received the annual influenza vaccination but does not plan to receive the COVID-19 booster.  Screening for tuberculosis - Order for TB gold released today. Plan: QuantiFERON-TB Gold Plus  DDD (degenerative disc disease), cervical: She has slightly limited ROM with lateral rotation.  She is currently going to physical therapy which has improved her discomfort and stiffness.  No symptoms of radiculopathy at this time.    Osteopenia of multiple sites: DEXA updated on 05/25/20 and results were reviewed today in the office: RFN BMD 0.645 with T-score -1.8.  She recently started taking an OTC vitamin D supplement daily.   Other medical conditions are listed as follows:   History of restless legs syndrome  History of esophagitis     Orders: Orders Placed This Encounter  Procedures   CBC with Differential/Platelet   COMPLETE METABOLIC PANEL WITH GFR   QuantiFERON-TB Gold Plus   Meds ordered this encounter  Medications   Certolizumab Pegol KIT 400 mg      Follow-Up Instructions: Return in about 5 months (around 03/20/2021) for Psoriatic arthritis.   Ofilia Neas, PA-C  Note -  This record has been created using Dragon software.  Chart  creation errors have been sought, but may not always  have been located. Such creation errors do not reflect on  the standard of medical care.

## 2020-10-05 ENCOUNTER — Other Ambulatory Visit: Payer: Self-pay | Admitting: Physician Assistant

## 2020-10-05 NOTE — Telephone Encounter (Signed)
Next Visit: 10/18/2020   Last Visit: 05/10/2020   Last Fill: 07/07/2020  DX:  Psoriatic arthropathy    Current Dose per office note 05/10/2020, Methotrexate 7 tablets by mouth once weekly (4 on saturdays and 3 tablets on sundays)   Labs: 07/26/2020 CBC and CMP are normal  Okay to refill MTX?

## 2020-10-18 ENCOUNTER — Ambulatory Visit (INDEPENDENT_AMBULATORY_CARE_PROVIDER_SITE_OTHER): Payer: Medicare Other | Admitting: Physician Assistant

## 2020-10-18 ENCOUNTER — Encounter: Payer: Self-pay | Admitting: Physician Assistant

## 2020-10-18 ENCOUNTER — Other Ambulatory Visit: Payer: Self-pay

## 2020-10-18 VITALS — BP 146/90 | HR 71 | Ht 62.0 in | Wt 177.0 lb

## 2020-10-18 DIAGNOSIS — M5030 Other cervical disc degeneration, unspecified cervical region: Secondary | ICD-10-CM

## 2020-10-18 DIAGNOSIS — L409 Psoriasis, unspecified: Secondary | ICD-10-CM

## 2020-10-18 DIAGNOSIS — Z111 Encounter for screening for respiratory tuberculosis: Secondary | ICD-10-CM

## 2020-10-18 DIAGNOSIS — Z8719 Personal history of other diseases of the digestive system: Secondary | ICD-10-CM

## 2020-10-18 DIAGNOSIS — R29898 Other symptoms and signs involving the musculoskeletal system: Secondary | ICD-10-CM

## 2020-10-18 DIAGNOSIS — Z79899 Other long term (current) drug therapy: Secondary | ICD-10-CM

## 2020-10-18 DIAGNOSIS — M8589 Other specified disorders of bone density and structure, multiple sites: Secondary | ICD-10-CM

## 2020-10-18 DIAGNOSIS — L4050 Arthropathic psoriasis, unspecified: Secondary | ICD-10-CM

## 2020-10-18 DIAGNOSIS — Z8669 Personal history of other diseases of the nervous system and sense organs: Secondary | ICD-10-CM

## 2020-10-18 MED ORDER — CERTOLIZUMAB PEGOL 2 X 200 MG ~~LOC~~ KIT
400.0000 mg | PACK | Freq: Once | SUBCUTANEOUS | Status: AC
  Administered 2020-10-18: 400 mg via SUBCUTANEOUS

## 2020-10-18 NOTE — Patient Instructions (Signed)
Standing Labs We placed an order today for your standing lab work.   Please have your standing labs drawn in December and every 3 months  If possible, please have your labs drawn 2 weeks prior to your appointment so that the provider can discuss your results at your appointment.  Please note that you may see your imaging and lab results in [facility] before we have reviewed them. We may be awaiting multiple results to interpret others before contacting you. Please allow our office up to 72 hours to thoroughly review all of the results before contacting the office for clarification of your results.  We have open lab daily: Monday through Thursday from 1:30-4:30 PM and Friday from 1:30-4:00 PM at the office of Dr. Bo Merino, [facility].   Please be advised, all patients with office appointments requiring lab work will take precedent over walk-in lab work.  If possible, please come for your lab work on Monday and Friday afternoons, as you may experience shorter wait times. The office is located at [address], [place], [place], [place] [address] No appointment is necessary.   Labs are drawn by [facility]. Please bring your co-pay at the time of your lab draw.  You may receive a bill from [facility] for your lab work.  If you wish to have your labs drawn at another location, please call the office 24 hours in advance to send orders.  If you have any questions regarding directions or hours of operation,  please call [phone].   As a reminder, please drink plenty of water prior to coming for your lab work. Thanks!   Hand Exercises Hand exercises can be helpful for almost anyone. These exercises can strengthen the hands, improve flexibility and movement, and increase blood flow to the hands. These results can make work and daily tasks easier. Hand exercises can be especially helpful for people who have joint pain from arthritis or have nerve damage from overuse (carpal tunnel  syndrome). These exercises can also help people who have injured a hand. Exercises Most of these hand exercises are gentle stretching and motion exercises. It is usually safe to do them often throughout the day. Warming up your hands before exercise may help to reduce stiffness. You can do this with gentle massage or by placing your hands in warm water for 10-15 minutes. It is normal to feel some stretching, pulling, tightness, or mild discomfort as you begin new exercises. This will gradually improve. Stop an exercise right away if you feel sudden, severe pain or your pain gets worse. Ask your health care provider which exercises are best for you. Knuckle bend or "claw" fist  Stand or sit with your arm, hand, and all five fingers pointed straight up. Make sure to keep your wrist straight during the exercise. Gently bend your fingers down toward your palm until the tips of your fingers are touching the top of your palm. Keep your big knuckle straight and just bend the small knuckles in your fingers. Hold this position for __________ seconds. Straighten (extend) your fingers back to the starting position. Repeat this exercise 5-10 times with each hand. Full finger fist  Stand or sit with your arm, hand, and all five fingers pointed straight up. Make sure to keep your wrist straight during the exercise. Gently bend your fingers into your palm until the tips of your fingers are touching the middle of your palm. Hold this position for __________ seconds. Extend your fingers back to the starting  position, stretching every joint fully. Repeat this exercise 5-10 times with each hand. Straight fist Stand or sit with your arm, hand, and all five fingers pointed straight up. Make sure to keep your wrist straight during the exercise. Gently bend your fingers at the big knuckle, where your fingers meet your hand, and the middle knuckle. Keep the knuckle at the tips of your fingers straight and try to touch  the bottom of your palm. Hold this position for __________ seconds. Extend your fingers back to the starting position, stretching every joint fully. Repeat this exercise 5-10 times with each hand. Tabletop  Stand or sit with your arm, hand, and all five fingers pointed straight up. Make sure to keep your wrist straight during the exercise. Gently bend your fingers at the big knuckle, where your fingers meet your hand, as far down as you can while keeping the small knuckles in your fingers straight. Think of forming a tabletop with your fingers. Hold this position for __________ seconds. Extend your fingers back to the starting position, stretching every joint fully. Repeat this exercise 5-10 times with each hand. Finger spread  Place your hand flat on a table with your palm facing down. Make sure your wrist stays straight as you do this exercise. Spread your fingers and thumb apart from each other as far as you can until you feel a gentle stretch. Hold this position for __________ seconds. Bring your fingers and thumb tight together again. Hold this position for __________ seconds. Repeat this exercise 5-10 times with each hand. Making circles  Stand or sit with your arm, hand, and all five fingers pointed straight up. Make sure to keep your wrist straight during the exercise. Make a circle by touching the tip of your thumb to the tip of your index finger. Hold for __________ seconds. Then open your hand wide. Repeat this motion with your thumb and each finger on your hand. Repeat this exercise 5-10 times with each hand. Thumb motion  Sit with your forearm resting on a table and your wrist straight. Your thumb should be facing up toward the ceiling. Keep your fingers relaxed as you move your thumb. Lift your thumb up as high as you can toward the ceiling. Hold for __________ seconds. Bend your thumb across your palm as far as you can, reaching the tip of your thumb for the small finger  (pinkie) side of your palm. Hold for __________ seconds. Repeat this exercise 5-10 times with each hand. Grip strengthening  Hold a stress ball or other soft ball in the middle of your hand. Slowly increase the pressure, squeezing the ball as much as you can without causing pain. Think of bringing the tips of your fingers into the middle of your palm. All of your finger joints should bend when doing this exercise. Hold your squeeze for __________ seconds, then relax. Repeat this exercise 5-10 times with each hand. Contact a health care provider if: Your hand pain or discomfort gets much worse when you do an exercise. Your hand pain or discomfort does not improve within 2 hours after you exercise. If you have any of these problems, stop doing these exercises right away. Do not do them again unless your health care provider says that you can. Get help right away if: You develop sudden, severe hand pain or swelling. If this happens, stop doing these exercises right away. Do not do them again unless your health care provider says that you can. This information is not  intended to replace advice given to you by your health care provider. Make sure you discuss any questions you have with your health care provider. Document Revised: 05/13/2020 Document Reviewed: 05/13/2020 Elsevier Patient Education  [place].

## 2020-10-18 NOTE — Progress Notes (Signed)
Pharmacy Note  Subjective:   Patient presents to clinic today to receive monthly dose of Cimzia.  Patient running a fever or have signs/symptoms of infection? No  Patient currently on antibiotics for the treatment of infection? No  Patient have any upcoming invasive procedures/surgeries? No  Objective: CMP     Component Value Date/Time   NA 141 07/26/2020 1421   K 4.2 07/26/2020 1421   CL 107 07/26/2020 1421   CO2 26 07/26/2020 1421   GLUCOSE 85 07/26/2020 1421   BUN 14 07/26/2020 1421   CREATININE 0.84 07/26/2020 1421   CALCIUM 9.2 07/26/2020 1421   PROT 7.0 07/26/2020 1421   ALBUMIN 4.4 08/07/2016 0911   AST 16 07/26/2020 1421   ALT 16 07/26/2020 1421   ALKPHOS 82 08/07/2016 0911   BILITOT 0.5 07/26/2020 1421   GFRNONAA 73 07/26/2020 1421   GFRAA 85 07/26/2020 1421    CBC    Component Value Date/Time   WBC 7.9 07/26/2020 1421   RBC 4.96 07/26/2020 1421   HGB 14.1 07/26/2020 1421   HGB 14.4 11/25/2013 0943   HCT 42.8 07/26/2020 1421   PLT 203 07/26/2020 1421   MCV 86.3 07/26/2020 1421   MCH 28.4 07/26/2020 1421   MCHC 32.9 07/26/2020 1421   RDW 13.8 07/26/2020 1421   LYMPHSABS 2,315 07/26/2020 1421   MONOABS 560 08/07/2016 0911   EOSABS 150 07/26/2020 1421   BASOSABS 47 07/26/2020 1421    Baseline Immunosuppressant Therapy Labs TB GOLD Quantiferon TB Gold Latest Ref Rng & Units 08/27/2019  Quantiferon TB Gold Plus NEGATIVE NEGATIVE   Hepatitis Panel Hepatitis Latest Ref Rng & Units 08/07/2016  Hep B Surface Ag NEGATIVE NEGATIVE  Hep B IgM NON REACTIVE NON REACTIVE  Hep C Ab NEGATIVE NEGATIVE   HIV Lab Results  Component Value Date   HIV NONREACTIVE 08/07/2016   Immunoglobulins Immunoglobulin Electrophoresis Latest Ref Rng & Units 08/07/2016  IgG 694 - 1,618 mg/dL 1,114  IgM 48 - 271 mg/dL 151   SPEP Serum Protein Electrophoresis Latest Ref Rng & Units 07/26/2020  Total Protein 6.1 - 8.1 g/dL 7.0  Albumin 3.8 - 4.8 g/dL -  Alpha-1 0.2 - 0.3 g/dL  -  Alpha-2 0.5 - 0.9 g/dL -  Beta Globulin 0.4 - 0.6 g/dL -  Beta 2 0.2 - 0.5 g/dL -  Gamma Globulin 0.8 - 1.7 g/dL -  Interpretation - -   G6PD No results found for: G6PDH TPMT No results found for: TPMT   Chest x-ray: 02/03/2011 No active disease   Assessment/Plan:   Administrations This Visit     Certolizumab Pegol KIT 400 mg     Admin Date 10/18/2020 Action Given Dose 400 mg Route Subcutaneous Administered By Carole Binning, LPN             Patient tolerated injection well.   Appointment for next injection scheduled for November 15, 2020.  Patient due for labs in today, completed while in office and again in December 2022.Marland Kitchen  Patient is to call and reschedule appointment if running a fever with signs/symptoms of infection, on antibiotics for active infection or has an upcoming invasive procedure.  All questions encouraged and answered.  Instructed patient to call with any further questions or concerns.

## 2020-10-19 NOTE — Progress Notes (Signed)
ALT is borderline elevated-31. AST WNL.  Rest of CMP WNL.  CBC WNL.  Please advise the patient to try to avoid taking tylenol, NSAIDs, and alcohol use.

## 2020-10-21 LAB — CBC WITH DIFFERENTIAL/PLATELET
Absolute Monocytes: 533 cells/uL (ref 200–950)
Basophils Absolute: 39 cells/uL (ref 0–200)
Basophils Relative: 0.6 %
Eosinophils Absolute: 130 cells/uL (ref 15–500)
Eosinophils Relative: 2 %
HCT: 42.5 % (ref 35.0–45.0)
Hemoglobin: 14.1 g/dL (ref 11.7–15.5)
Lymphs Abs: 1898 cells/uL (ref 850–3900)
MCH: 29.2 pg (ref 27.0–33.0)
MCHC: 33.2 g/dL (ref 32.0–36.0)
MCV: 88 fL (ref 80.0–100.0)
MPV: 10.9 fL (ref 7.5–12.5)
Monocytes Relative: 8.2 %
Neutro Abs: 3900 cells/uL (ref 1500–7800)
Neutrophils Relative %: 60 %
Platelets: 210 10*3/uL (ref 140–400)
RBC: 4.83 10*6/uL (ref 3.80–5.10)
RDW: 14.3 % (ref 11.0–15.0)
Total Lymphocyte: 29.2 %
WBC: 6.5 10*3/uL (ref 3.8–10.8)

## 2020-10-21 LAB — COMPLETE METABOLIC PANEL WITH GFR
AG Ratio: 1.8 (calc) (ref 1.0–2.5)
ALT: 31 U/L — ABNORMAL HIGH (ref 6–29)
AST: 21 U/L (ref 10–35)
Albumin: 4.3 g/dL (ref 3.6–5.1)
Alkaline phosphatase (APISO): 71 U/L (ref 37–153)
BUN: 12 mg/dL (ref 7–25)
CO2: 30 mmol/L (ref 20–32)
Calcium: 9.1 mg/dL (ref 8.6–10.4)
Chloride: 106 mmol/L (ref 98–110)
Creat: 0.82 mg/dL (ref 0.50–1.05)
Globulin: 2.4 g/dL (calc) (ref 1.9–3.7)
Glucose, Bld: 78 mg/dL (ref 65–99)
Potassium: 4.2 mmol/L (ref 3.5–5.3)
Sodium: 142 mmol/L (ref 135–146)
Total Bilirubin: 0.4 mg/dL (ref 0.2–1.2)
Total Protein: 6.7 g/dL (ref 6.1–8.1)
eGFR: 79 mL/min/{1.73_m2} (ref >=60)

## 2020-10-21 LAB — QUANTIFERON-TB GOLD PLUS
Mitogen-NIL: >10 IU/mL
NIL: 0.04 IU/mL
QuantiFERON-TB Gold Plus: NEGATIVE
TB1-NIL: <0 IU/mL
TB2-NIL: 0 IU/mL

## 2020-10-21 NOTE — Progress Notes (Signed)
TB gold negative

## 2020-11-15 ENCOUNTER — Other Ambulatory Visit: Payer: Self-pay

## 2020-11-15 ENCOUNTER — Ambulatory Visit (INDEPENDENT_AMBULATORY_CARE_PROVIDER_SITE_OTHER): Payer: Medicare Other | Admitting: *Deleted

## 2020-11-15 VITALS — BP 153/91 | HR 85

## 2020-11-15 DIAGNOSIS — L4050 Arthropathic psoriasis, unspecified: Secondary | ICD-10-CM | POA: Diagnosis not present

## 2020-11-15 MED ORDER — CERTOLIZUMAB PEGOL 2 X 200 MG ~~LOC~~ KIT
400.0000 mg | PACK | Freq: Once | SUBCUTANEOUS | Status: AC
Start: 2020-11-15 — End: 2020-11-15
  Administered 2020-11-15: 400 mg via SUBCUTANEOUS

## 2020-11-15 NOTE — Progress Notes (Signed)
Pharmacy Note  Subjective:   Patient presents to clinic today to receive monthly dose of Cimzia.  Patient running a fever or have signs/symptoms of infection? No  Patient currently on antibiotics for the treatment of infection? No  Patient have any upcoming invasive procedures/surgeries? No  Objective: CMP     Component Value Date/Time   NA 142 10/18/2020 1009   K 4.2 10/18/2020 1009   CL 106 10/18/2020 1009   CO2 30 10/18/2020 1009   GLUCOSE 78 10/18/2020 1009   BUN 12 10/18/2020 1009   CREATININE 0.82 10/18/2020 1009   CALCIUM 9.1 10/18/2020 1009   PROT 6.7 10/18/2020 1009   ALBUMIN 4.4 08/07/2016 0911   AST 21 10/18/2020 1009   ALT 31 (H) 10/18/2020 1009   ALKPHOS 82 08/07/2016 0911   BILITOT 0.4 10/18/2020 1009   GFRNONAA 73 07/26/2020 1421   GFRAA 85 07/26/2020 1421    CBC    Component Value Date/Time   WBC 6.5 10/18/2020 1009   RBC 4.83 10/18/2020 1009   HGB 14.1 10/18/2020 1009   HGB 14.4 11/25/2013 0943   HCT 42.5 10/18/2020 1009   PLT 210 10/18/2020 1009   MCV 88.0 10/18/2020 1009   MCH 29.2 10/18/2020 1009   MCHC 33.2 10/18/2020 1009   RDW 14.3 10/18/2020 1009   LYMPHSABS 1,898 10/18/2020 1009   MONOABS 560 08/07/2016 0911   EOSABS 130 10/18/2020 1009   BASOSABS 39 10/18/2020 1009    Baseline Immunosuppressant Therapy Labs TB GOLD Quantiferon TB Gold Latest Ref Rng & Units 10/18/2020  Quantiferon TB Gold Plus NEGATIVE NEGATIVE   Hepatitis Panel Hepatitis Latest Ref Rng & Units 08/07/2016  Hep B Surface Ag NEGATIVE NEGATIVE  Hep B IgM NON REACTIVE NON REACTIVE  Hep C Ab NEGATIVE NEGATIVE   HIV Lab Results  Component Value Date   HIV NONREACTIVE 08/07/2016   Immunoglobulins Immunoglobulin Electrophoresis Latest Ref Rng & Units 08/07/2016  IgG 694 - 1,618 mg/dL 1,114  IgM 48 - 271 mg/dL 151   SPEP Serum Protein Electrophoresis Latest Ref Rng & Units 10/18/2020  Total Protein 6.1 - 8.1 g/dL 6.7  Albumin 3.8 - 4.8 g/dL -  Alpha-1 0.2 - 0.3  g/dL -  Alpha-2 0.5 - 0.9 g/dL -  Beta Globulin 0.4 - 0.6 g/dL -  Beta 2 0.2 - 0.5 g/dL -  Gamma Globulin 0.8 - 1.7 g/dL -  Interpretation - -   G6PD No results found for: G6PDH TPMT No results found for: TPMT   Chest x-ray: 02/03/2011 No active disease   Assessment/Plan:   Administrations This Visit     Certolizumab Pegol KIT 400 mg     Admin Date 11/15/2020 Action Given Dose 400 mg Route Subcutaneous Administered By Carole Binning, LPN             Patient tolerated injection well.   Appointment for next injection scheduled for 12/13/2020.  Patient due for labs in December 2022.  Patient is to call and reschedule appointment if running a fever with signs/symptoms of infection, on antibiotics for active infection or has an upcoming invasive procedure.  All questions encouraged and answered.  Instructed patient to call with any further questions or concerns.

## 2020-12-13 ENCOUNTER — Ambulatory Visit (INDEPENDENT_AMBULATORY_CARE_PROVIDER_SITE_OTHER): Payer: Medicare Other | Admitting: *Deleted

## 2020-12-13 ENCOUNTER — Other Ambulatory Visit: Payer: Self-pay

## 2020-12-13 VITALS — BP 120/80 | HR 124

## 2020-12-13 DIAGNOSIS — L4050 Arthropathic psoriasis, unspecified: Secondary | ICD-10-CM | POA: Diagnosis not present

## 2020-12-13 MED ORDER — CERTOLIZUMAB PEGOL 2 X 200 MG ~~LOC~~ KIT
400.0000 mg | PACK | Freq: Once | SUBCUTANEOUS | Status: AC
  Administered 2020-12-13: 400 mg via SUBCUTANEOUS

## 2020-12-13 NOTE — Progress Notes (Signed)
Subjective:   Patient presents to clinic today to receive monthly dose of Cimzia.  Patient running a fever or have signs/symptoms of infection? No  Patient currently on antibiotics for the treatment of infection? No  Patient have any upcoming invasive procedures/surgeries? No  Objective: CMP     Component Value Date/Time   NA 142 10/18/2020 1009   K 4.2 10/18/2020 1009   CL 106 10/18/2020 1009   CO2 30 10/18/2020 1009   GLUCOSE 78 10/18/2020 1009   BUN 12 10/18/2020 1009   CREATININE 0.82 10/18/2020 1009   CALCIUM 9.1 10/18/2020 1009   PROT 6.7 10/18/2020 1009   ALBUMIN 4.4 08/07/2016 0911   AST 21 10/18/2020 1009   ALT 31 (H) 10/18/2020 1009   ALKPHOS 82 08/07/2016 0911   BILITOT 0.4 10/18/2020 1009   GFRNONAA 73 07/26/2020 1421   GFRAA 85 07/26/2020 1421    CBC    Component Value Date/Time   WBC 6.5 10/18/2020 1009   RBC 4.83 10/18/2020 1009   HGB 14.1 10/18/2020 1009   HGB 14.4 11/25/2013 0943   HCT 42.5 10/18/2020 1009   PLT 210 10/18/2020 1009   MCV 88.0 10/18/2020 1009   MCH 29.2 10/18/2020 1009   MCHC 33.2 10/18/2020 1009   RDW 14.3 10/18/2020 1009   LYMPHSABS 1,898 10/18/2020 1009   MONOABS 560 08/07/2016 0911   EOSABS 130 10/18/2020 1009   BASOSABS 39 10/18/2020 1009    Baseline Immunosuppressant Therapy Labs TB GOLD Quantiferon TB Gold Latest Ref Rng & Units 10/18/2020  Quantiferon TB Gold Plus NEGATIVE NEGATIVE   Hepatitis Panel Hepatitis Latest Ref Rng & Units 08/07/2016  Hep B Surface Ag NEGATIVE NEGATIVE  Hep B IgM NON REACTIVE NON REACTIVE  Hep C Ab NEGATIVE NEGATIVE   HIV Lab Results  Component Value Date   HIV NONREACTIVE 08/07/2016   Immunoglobulins Immunoglobulin Electrophoresis Latest Ref Rng & Units 08/07/2016  IgG 694 - 1,618 mg/dL 1,114  IgM 48 - 271 mg/dL 151   SPEP Serum Protein Electrophoresis Latest Ref Rng & Units 10/18/2020  Total Protein 6.1 - 8.1 g/dL 6.7  Albumin 3.8 - 4.8 g/dL -  Alpha-1 0.2 - 0.3 g/dL -  Alpha-2  0.5 - 0.9 g/dL -  Beta Globulin 0.4 - 0.6 g/dL -  Beta 2 0.2 - 0.5 g/dL -  Gamma Globulin 0.8 - 1.7 g/dL -  Interpretation - -   G6PD No results found for: G6PDH TPMT No results found for: TPMT   Chest x-ray:  02/03/2011 No active disease   Assessment/Plan:   Administrations This Visit     Certolizumab Pegol KIT 400 mg     Admin Date 12/13/2020 Action Given Dose 400 mg Route Subcutaneous Administered By ,  L, LPN             Patient tolerated injection well .   Appointment for next injection scheduled for 01/10/2021.  Patient due for labs in December.  Patient is to call and reschedule appointment if running a fever with signs/symptoms of infection, on antibiotics for active infection or has an upcoming invasive procedure.  All questions encouraged and answered.  Instructed patient to call with any further questions or concerns.   

## 2021-01-10 ENCOUNTER — Other Ambulatory Visit: Payer: Self-pay

## 2021-01-10 ENCOUNTER — Ambulatory Visit (INDEPENDENT_AMBULATORY_CARE_PROVIDER_SITE_OTHER): Payer: Medicare Other | Admitting: *Deleted

## 2021-01-10 VITALS — BP 156/94 | HR 77

## 2021-01-10 DIAGNOSIS — L4050 Arthropathic psoriasis, unspecified: Secondary | ICD-10-CM

## 2021-01-10 DIAGNOSIS — Z79899 Other long term (current) drug therapy: Secondary | ICD-10-CM

## 2021-01-10 MED ORDER — CERTOLIZUMAB PEGOL 2 X 200 MG ~~LOC~~ KIT
400.0000 mg | PACK | Freq: Once | SUBCUTANEOUS | Status: AC
Start: 2021-01-10 — End: 2021-01-10
  Administered 2021-01-10: 400 mg via SUBCUTANEOUS

## 2021-01-10 NOTE — Progress Notes (Signed)
Subjective:   Patient presents to clinic today to receive monthly dose of Cimzia.  Patient running a fever or have signs/symptoms of infection? No  Patient currently on antibiotics for the treatment of infection? No  Patient have any upcoming invasive procedures/surgeries? No  Objective: CMP     Component Value Date/Time   NA 142 10/18/2020 1009   K 4.2 10/18/2020 1009   CL 106 10/18/2020 1009   CO2 30 10/18/2020 1009   GLUCOSE 78 10/18/2020 1009   BUN 12 10/18/2020 1009   CREATININE 0.82 10/18/2020 1009   CALCIUM 9.1 10/18/2020 1009   PROT 6.7 10/18/2020 1009   ALBUMIN 4.4 08/07/2016 0911   AST 21 10/18/2020 1009   ALT 31 (H) 10/18/2020 1009   ALKPHOS 82 08/07/2016 0911   BILITOT 0.4 10/18/2020 1009   GFRNONAA 73 07/26/2020 1421   GFRAA 85 07/26/2020 1421    CBC    Component Value Date/Time   WBC 6.5 10/18/2020 1009   RBC 4.83 10/18/2020 1009   HGB 14.1 10/18/2020 1009   HGB 14.4 11/25/2013 0943   HCT 42.5 10/18/2020 1009   PLT 210 10/18/2020 1009   MCV 88.0 10/18/2020 1009   MCH 29.2 10/18/2020 1009   MCHC 33.2 10/18/2020 1009   RDW 14.3 10/18/2020 1009   LYMPHSABS 1,898 10/18/2020 1009   MONOABS 560 08/07/2016 0911   EOSABS 130 10/18/2020 1009   BASOSABS 39 10/18/2020 1009    Baseline Immunosuppressant Therapy Labs TB GOLD Quantiferon TB Gold Latest Ref Rng & Units 10/18/2020  Quantiferon TB Gold Plus NEGATIVE NEGATIVE   Hepatitis Panel Hepatitis Latest Ref Rng & Units 08/07/2016  Hep B Surface Ag NEGATIVE NEGATIVE  Hep B IgM NON REACTIVE NON REACTIVE  Hep C Ab NEGATIVE NEGATIVE   HIV Lab Results  Component Value Date   HIV NONREACTIVE 08/07/2016   Immunoglobulins Immunoglobulin Electrophoresis Latest Ref Rng & Units 08/07/2016  IgG 694 - 1,618 mg/dL 1,114  IgM 48 - 271 mg/dL 151   SPEP Serum Protein Electrophoresis Latest Ref Rng & Units 10/18/2020  Total Protein 6.1 - 8.1 g/dL 6.7  Albumin 3.8 - 4.8 g/dL -  Alpha-1 0.2 - 0.3 g/dL -  Alpha-2  0.5 - 0.9 g/dL -  Beta Globulin 0.4 - 0.6 g/dL -  Beta 2 0.2 - 0.5 g/dL -  Gamma Globulin 0.8 - 1.7 g/dL -  Interpretation - -   G6PD No results found for: G6PDH TPMT No results found for: TPMT   Chest x-ray: 02/03/2011 No active disease   Assessment/Plan:   Administrations This Visit     Certolizumab Pegol KIT 400 mg     Admin Date 01/10/2021 Action Given Dose 400 mg Route Subcutaneous Administered By Carole Binning, LPN             Patient tolerated injection well.   Appointment for next injection scheduled for 02/08/2021.  Patient due for labs in today and again in March 2023.  Patient is to call and reschedule appointment if running a fever with signs/symptoms of infection, on antibiotics for active infection or has an upcoming invasive procedure.  All questions encouraged and answered.  Instructed patient to call with any further questions or concerns.

## 2021-01-11 ENCOUNTER — Other Ambulatory Visit: Payer: Self-pay | Admitting: *Deleted

## 2021-01-11 DIAGNOSIS — Z79899 Other long term (current) drug therapy: Secondary | ICD-10-CM

## 2021-01-11 LAB — CBC WITH DIFFERENTIAL/PLATELET
Absolute Monocytes: 501 cells/uL (ref 200–950)
Basophils Absolute: 33 cells/uL (ref 0–200)
Basophils Relative: 0.5 %
Eosinophils Absolute: 117 cells/uL (ref 15–500)
Eosinophils Relative: 1.8 %
HCT: 41.1 % (ref 35.0–45.0)
Hemoglobin: 13.7 g/dL (ref 11.7–15.5)
Lymphs Abs: 1794 cells/uL (ref 850–3900)
MCH: 29.7 pg (ref 27.0–33.0)
MCHC: 33.3 g/dL (ref 32.0–36.0)
MCV: 89.2 fL (ref 80.0–100.0)
MPV: 11.7 fL (ref 7.5–12.5)
Monocytes Relative: 7.7 %
Neutro Abs: 4056 cells/uL (ref 1500–7800)
Neutrophils Relative %: 62.4 %
Platelets: 184 10*3/uL (ref 140–400)
RBC: 4.61 10*6/uL (ref 3.80–5.10)
RDW: 14.6 % (ref 11.0–15.0)
Total Lymphocyte: 27.6 %
WBC: 6.5 10*3/uL (ref 3.8–10.8)

## 2021-01-11 LAB — COMPLETE METABOLIC PANEL WITH GFR
AG Ratio: 1.6 (calc) (ref 1.0–2.5)
ALT: 27 U/L (ref 6–29)
AST: 25 U/L (ref 10–35)
Albumin: 4.1 g/dL (ref 3.6–5.1)
Alkaline phosphatase (APISO): 70 U/L (ref 37–153)
BUN/Creatinine Ratio: 10 (calc) (ref 6–22)
BUN: 12 mg/dL (ref 7–25)
CO2: 27 mmol/L (ref 20–32)
Calcium: 8.9 mg/dL (ref 8.6–10.4)
Chloride: 105 mmol/L (ref 98–110)
Creat: 1.15 mg/dL — ABNORMAL HIGH (ref 0.50–1.05)
Globulin: 2.5 g/dL (calc) (ref 1.9–3.7)
Glucose, Bld: 92 mg/dL (ref 65–99)
Potassium: 3.9 mmol/L (ref 3.5–5.3)
Sodium: 141 mmol/L (ref 135–146)
Total Bilirubin: 0.5 mg/dL (ref 0.2–1.2)
Total Protein: 6.6 g/dL (ref 6.1–8.1)
eGFR: 53 mL/min/{1.73_m2} — ABNORMAL LOW (ref >=60)

## 2021-01-11 MED ORDER — METHOTREXATE 2.5 MG PO TABS: 12.5000 mg | ORAL_TABLET | ORAL | 0 refills | Status: DC

## 2021-01-11 NOTE — Progress Notes (Signed)
CBC is normal.  Creatinine is elevated.  Patient had no synovitis at the last visit.  Please have patient reduce the dose of methotrexate to 5 tablets p.o. weekly.  Recheck BMP in 1 month.

## 2021-01-11 NOTE — Telephone Encounter (Signed)
-----   Message from Bo Merino, MD sent at 01/11/2021 12:10 PM EST ----- CBC is normal.  Creatinine is elevated.  Patient had no synovitis at the last visit.  Please have patient reduce the dose of methotrexate to 5 tablets p.o. weekly.  Recheck BMP in 1 month.

## 2021-02-08 ENCOUNTER — Other Ambulatory Visit: Payer: Self-pay

## 2021-02-08 ENCOUNTER — Ambulatory Visit (INDEPENDENT_AMBULATORY_CARE_PROVIDER_SITE_OTHER): Payer: Medicare Other | Admitting: *Deleted

## 2021-02-08 VITALS — BP 161/79 | HR 72

## 2021-02-08 DIAGNOSIS — L4050 Arthropathic psoriasis, unspecified: Secondary | ICD-10-CM

## 2021-02-08 MED ORDER — CERTOLIZUMAB PEGOL 2 X 200 MG ~~LOC~~ KIT
400.0000 mg | PACK | Freq: Once | SUBCUTANEOUS | Status: AC
  Administered 2021-02-08: 400 mg via SUBCUTANEOUS

## 2021-02-08 NOTE — Progress Notes (Signed)
Subjective:   Patient presents to clinic today to receive monthly dose of Cimzia.  Patient running a fever or have signs/symptoms of infection? No  Patient currently on antibiotics for the treatment of infection? No  Patient have any upcoming invasive procedures/surgeries? No  Objective: CMP     Component Value Date/Time   NA 141 01/10/2021 1411   K 3.9 01/10/2021 1411   CL 105 01/10/2021 1411   CO2 27 01/10/2021 1411   GLUCOSE 92 01/10/2021 1411   BUN 12 01/10/2021 1411   CREATININE 1.15 (H) 01/10/2021 1411   CALCIUM 8.9 01/10/2021 1411   PROT 6.6 01/10/2021 1411   ALBUMIN 4.4 08/07/2016 0911   AST 25 01/10/2021 1411   ALT 27 01/10/2021 1411   ALKPHOS 82 08/07/2016 0911   BILITOT 0.5 01/10/2021 1411   GFRNONAA 73 07/26/2020 1421   GFRAA 85 07/26/2020 1421    CBC    Component Value Date/Time   WBC 6.5 01/10/2021 1411   RBC 4.61 01/10/2021 1411   HGB 13.7 01/10/2021 1411   HGB 14.4 11/25/2013 0943   HCT 41.1 01/10/2021 1411   PLT 184 01/10/2021 1411   MCV 89.2 01/10/2021 1411   MCH 29.7 01/10/2021 1411   MCHC 33.3 01/10/2021 1411   RDW 14.6 01/10/2021 1411   LYMPHSABS 1,794 01/10/2021 1411   MONOABS 560 08/07/2016 0911   EOSABS 117 01/10/2021 1411   BASOSABS 33 01/10/2021 1411    Baseline Immunosuppressant Therapy Labs TB GOLD Quantiferon TB Gold Latest Ref Rng & Units 10/18/2020  Quantiferon TB Gold Plus NEGATIVE NEGATIVE   Hepatitis Panel Hepatitis Latest Ref Rng & Units 08/07/2016  Hep B Surface Ag NEGATIVE NEGATIVE  Hep B IgM NON REACTIVE NON REACTIVE  Hep C Ab NEGATIVE NEGATIVE   HIV Lab Results  Component Value Date   HIV NONREACTIVE 08/07/2016   Immunoglobulins Immunoglobulin Electrophoresis Latest Ref Rng & Units 08/07/2016  IgG 694 - 1,618 mg/dL 1,114  IgM 48 - 271 mg/dL 151   SPEP Serum Protein Electrophoresis Latest Ref Rng & Units 01/10/2021  Total Protein 6.1 - 8.1 g/dL 6.6  Albumin 3.8 - 4.8 g/dL -  Alpha-1 0.2 - 0.3 g/dL -  Alpha-2  0.5 - 0.9 g/dL -  Beta Globulin 0.4 - 0.6 g/dL -  Beta 2 0.2 - 0.5 g/dL -  Gamma Globulin 0.8 - 1.7 g/dL -  Interpretation - -   G6PD No results found for: G6PDH TPMT No results found for: TPMT   Chest x-ray: 02/03/2011 No active disease   Assessment/Plan:   Administrations This Visit     Certolizumab Pegol KIT 400 mg     Admin Date 02/08/2021 Action Given Dose 400 mg Route Subcutaneous Administered By Carole Binning, LPN             Patient tolerated injection well.   Appointment for next injection scheduled for 03/10/2020.  Patient due for labs in March 2023.  Patient is to call and reschedule appointment if running a fever with signs/symptoms of infection, on antibiotics for active infection or has an upcoming invasive procedure.  All questions encouraged and answered.  Instructed patient to call with any further questions or concerns.

## 2021-03-10 ENCOUNTER — Other Ambulatory Visit: Payer: Self-pay

## 2021-03-10 ENCOUNTER — Ambulatory Visit (INDEPENDENT_AMBULATORY_CARE_PROVIDER_SITE_OTHER): Payer: Medicare Other | Admitting: *Deleted

## 2021-03-10 VITALS — BP 128/88 | HR 89

## 2021-03-10 DIAGNOSIS — L4050 Arthropathic psoriasis, unspecified: Secondary | ICD-10-CM

## 2021-03-10 MED ORDER — CERTOLIZUMAB PEGOL 2 X 200 MG ~~LOC~~ KIT
400.0000 mg | PACK | Freq: Once | SUBCUTANEOUS | Status: AC
  Administered 2021-03-10: 400 mg via SUBCUTANEOUS

## 2021-03-10 NOTE — Progress Notes (Signed)
Pharmacy Note  Subjective:   Patient presents to clinic today to receive monthly dose of Cimzia.  Patient running a fever or have signs/symptoms of infection? No  Patient currently on antibiotics for the treatment of infection? No  Patient have any upcoming invasive procedures/surgeries? No  Objective: CMP     Component Value Date/Time   NA 141 01/10/2021 1411   K 3.9 01/10/2021 1411   CL 105 01/10/2021 1411   CO2 27 01/10/2021 1411   GLUCOSE 92 01/10/2021 1411   BUN 12 01/10/2021 1411   CREATININE 1.15 (H) 01/10/2021 1411   CALCIUM 8.9 01/10/2021 1411   PROT 6.6 01/10/2021 1411   ALBUMIN 4.4 08/07/2016 0911   AST 25 01/10/2021 1411   ALT 27 01/10/2021 1411   ALKPHOS 82 08/07/2016 0911   BILITOT 0.5 01/10/2021 1411   GFRNONAA 73 07/26/2020 1421   GFRAA 85 07/26/2020 1421    CBC    Component Value Date/Time   WBC 6.5 01/10/2021 1411   RBC 4.61 01/10/2021 1411   HGB 13.7 01/10/2021 1411   HGB 14.4 11/25/2013 0943   HCT 41.1 01/10/2021 1411   PLT 184 01/10/2021 1411   MCV 89.2 01/10/2021 1411   MCH 29.7 01/10/2021 1411   MCHC 33.3 01/10/2021 1411   RDW 14.6 01/10/2021 1411   LYMPHSABS 1,794 01/10/2021 1411   MONOABS 560 08/07/2016 0911   EOSABS 117 01/10/2021 1411   BASOSABS 33 01/10/2021 1411    Baseline Immunosuppressant Therapy Labs TB GOLD Quantiferon TB Gold Latest Ref Rng & Units 10/18/2020  Quantiferon TB Gold Plus NEGATIVE NEGATIVE   Hepatitis Panel Hepatitis Latest Ref Rng & Units 08/07/2016  Hep B Surface Ag NEGATIVE NEGATIVE  Hep B IgM NON REACTIVE NON REACTIVE  Hep C Ab NEGATIVE NEGATIVE   HIV Lab Results  Component Value Date   HIV NONREACTIVE 08/07/2016   Immunoglobulins Immunoglobulin Electrophoresis Latest Ref Rng & Units 08/07/2016  IgG 694 - 1,618 mg/dL 1,114  IgM 48 - 271 mg/dL 151   SPEP Serum Protein Electrophoresis Latest Ref Rng & Units 01/10/2021  Total Protein 6.1 - 8.1 g/dL 6.6  Albumin 3.8 - 4.8 g/dL -  Alpha-1 0.2 - 0.3  g/dL -  Alpha-2 0.5 - 0.9 g/dL -  Beta Globulin 0.4 - 0.6 g/dL -  Beta 2 0.2 - 0.5 g/dL -  Gamma Globulin 0.8 - 1.7 g/dL -  Interpretation - -   G6PD No results found for: G6PDH TPMT No results found for: TPMT   Chest x-ray: 02/03/2011 No active disease  Assessment/Plan:   Administrations This Visit     certolizumab pegol (CIMZIA) kit 400 mg     Admin Date 03/10/2021 Action Given Dose 400 mg Route Subcutaneous Administered By Carole Binning, LPN             Patient tolerated injection well .   Appointment for next injection scheduled for 04/07/2021.  Patient due for labs in March 2023.  Patient is to call and reschedule appointment if running a fever with signs/symptoms of infection, on antibiotics for active infection or has an upcoming invasive procedure.  All questions encouraged and answered.  Instructed patient to call with any further questions or concerns.

## 2021-03-16 ENCOUNTER — Telehealth: Payer: Self-pay | Admitting: Rheumatology

## 2021-03-16 NOTE — Telephone Encounter (Signed)
Ok to reschedule visit to 04/07/21.  We can update lab work at that time as well.

## 2021-03-16 NOTE — Telephone Encounter (Signed)
Reached out to patient to reschedule appointment with Dr. Estanislado Pandy on 03/28/21. Patient states she has an appointment with Seth Bake for Cimzia injection on 3/2. Patient states she has appointments every day between 2/20 and 3/2. Patient would like to schedule an appointment with Lovena Le on 3/2 and also get her Cimzia at the same time if possible so she doesn't have to make two trips so close together.

## 2021-03-17 ENCOUNTER — Other Ambulatory Visit: Payer: Self-pay | Admitting: Nurse Practitioner

## 2021-03-17 DIAGNOSIS — Z1231 Encounter for screening mammogram for malignant neoplasm of breast: Secondary | ICD-10-CM

## 2021-03-24 NOTE — Progress Notes (Signed)
Office Visit Note  Patient: Cynthia Snyder             Date of Birth: 1955/08/27           MRN: [id number]             PCP: Eartha Inch, MD Referring: Eartha Inch, MD Visit Date: 04/07/2021 Occupation: @GUAROCC @  Subjective:  Medication monitoring   History of Present Illness: Cynthia Snyder is a 66 y.o. female with history of psoriatic arthritis.  Patient is on Cimzia 400 mg subcutaneous injections every month and methotrexate 5 tablets by mouth once weekly.  Patient reduced the dose of methotrexate from 7 tablets to 5 tablets weekly in December 2022 due to elevated creatinine.  She has not noticed any increased joint pain or joint swelling on the reduced dose of methotrexate.  She has not had any recent flares.  She denies any joint swelling at this time.  She is occasional arthralgias especially in both hands as well as her right SI joint.  She has been using a heating pad as needed on her right SI joint.  She denies any Achilles tendinitis or plantar fasciitis. No recent infections.      Activities of Daily Living:  Patient reports morning stiffness for 15-20 minutes.   Patient Reports nocturnal pain.  Difficulty dressing/grooming: Denies Difficulty climbing stairs: Denies Difficulty getting out of chair: Denies Difficulty using hands for taps, buttons, cutlery, and/or writing: Denies  Review of Systems  Constitutional:  Negative for fatigue.  HENT:  Positive for mouth dryness. Negative for mouth sores and nose dryness.   Eyes:  Negative for pain, itching and dryness.  Respiratory:  Negative for shortness of breath and difficulty breathing.   Cardiovascular:  Positive for chest pain. Negative for palpitations.  Gastrointestinal:  Negative for blood in stool, constipation and diarrhea.  Endocrine: Negative for increased urination.  Genitourinary:  Negative for difficulty urinating.  Musculoskeletal:  Positive for joint pain, joint pain and morning  stiffness. Negative for joint swelling, myalgias, muscle tenderness and myalgias.  Skin:  Negative for color change, rash and redness.  Allergic/Immunologic: Negative for susceptible to infections.  Neurological:  Positive for numbness. Negative for dizziness, headaches, memory loss and weakness.  Hematological:  Negative for bruising/bleeding tendency.  Psychiatric/Behavioral:  Negative for confusion.    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   Pain of right hip joint 03/26/2017   Psoriatic arthropathy (HCC) 11/07/2016   Psoriasis 08/04/2016   DJD (degenerative joint disease), cervical 08/04/2016   Esophagitis 08/04/2016   Restless leg syndrome 08/04/2016   High risk medication use 08/04/2016   Atrophic vaginitis 01/22/2015    Class: Chronic    Past Medical History:  Diagnosis Date   Abnormal Pap smear    many yrs ago   Endometrial cancer (HCC)    Endometriosis    Esophagus disorder    Psoriatic arthritis (HCC)    Shingles    Shingles     Family History  Problem Relation Age of Onset   Heart disease Mother    Hypertension Mother    Stroke Mother    Diabetes Mother    Osteoporosis Mother    Hypothyroidism Mother    Heart disease Father    Heart disease Brother    Stroke Brother    Cancer Paternal Aunt        ovarian   Colon  cancer Maternal Grandmother    Breast cancer Neg Hx    Past Surgical History:  Procedure Laterality Date   ABDOMINAL HYSTERECTOMY     age 61   APPENDECTOMY     BILATERAL SALPINGOOPHORECTOMY Right 11/24/08   lysis of adhesion   CATARACT EXTRACTION, BILATERAL     esophageal stretched     EXPLORATORY LAPAROTOMY     jaw bone graft  2004   KNEE ARTHROPLASTY     KNEE SURGERY     LAPAROSCOPY     age 52   LIPOSUCTION TRUNK  04/06/2016   Dr. Izora Ribas for body image   LIVER BIOPSY     mild steatosis   NOSE SURGERY     Social History   Social History Narrative    Not on file   Immunization History  Administered Date(s) Administered   Influenza Inj Mdck Quad Pf 10/23/2017   Influenza Split 10/20/2017   Influenza, Seasonal, Injecte, Preservative Fre 10/29/2013, 12/08/2014, 11/18/2015   Moderna Sars-Covid-2 Vaccination 04/28/2019, 05/29/2019   Tdap 11/06/2012   Zoster, Live 10/01/2015     Objective: Vital Signs: BP (!) 148/96 (BP Location: Left Arm, Patient Position: Sitting, Cuff Size: Normal)   Pulse 71   Ht 5\' 2"  (1.575 m)   Wt 179 lb 9.6 oz (81.5 kg)   LMP 02/06/1982   BMI 32.85 kg/m    Physical Exam Vitals and nursing note reviewed.  Constitutional:      Appearance: She is well-developed.  HENT:     Head: Normocephalic and atraumatic.  Eyes:     Conjunctiva/sclera: Conjunctivae normal.  Cardiovascular:     Rate and Rhythm: Normal rate and regular rhythm.     Heart sounds: Normal heart sounds.  Pulmonary:     Effort: Pulmonary effort is normal.     Breath sounds: Normal breath sounds.  Abdominal:     General: Bowel sounds are normal.     Palpations: Abdomen is soft.  Musculoskeletal:     Cervical back: Normal range of motion.  Lymphadenopathy:     Cervical: No cervical adenopathy.  Skin:    General: Skin is warm and dry.     Capillary Refill: Capillary refill takes less than 2 seconds.  Neurological:     Mental Status: She is alert and oriented to person, place, and time.  Psychiatric:        Behavior: Behavior normal.     Musculoskeletal Exam: C-spine, thoracic spine, lumbar spine have good range of motion.  No midline spinal tenderness.  Tenderness over both SI joints especially the right side.  Shoulder joints, elbow joints, wrist joints, MCPs, PIPs, DIPs have good range of motion with no synovitis.  PIP and DIP thickening consistent with osteoarthritis of both hands.  Hip joints have good range of motion with no groin pain.  Knee joints have good range of motion with no warmth or effusion.  Ankle joints have good  range of motion.  Tenderness over the left ankle joint.   CDAI Exam: CDAI Score: -- Patient Global: --; Provider Global: -- Swollen: --; Tender: -- Joint Exam 04/07/2021   No joint exam has been documented for this visit   There is currently no information documented on the homunculus. Go to the Rheumatology activity and complete the homunculus joint exam.  Investigation: No additional findings.  Imaging: No results found.  Recent Labs: Lab Results  Component Value Date   WBC 6.5 01/10/2021   HGB 13.7 01/10/2021   PLT 184 01/10/2021  NA 141 01/10/2021   K 3.9 01/10/2021   CL 105 01/10/2021   CO2 27 01/10/2021   GLUCOSE 92 01/10/2021   BUN 12 01/10/2021   CREATININE 1.15 (H) 01/10/2021   BILITOT 0.5 01/10/2021   ALKPHOS 82 08/07/2016   AST 25 01/10/2021   ALT 27 01/10/2021   PROT 6.6 01/10/2021   ALBUMIN 4.4 08/07/2016   CALCIUM 8.9 01/10/2021   GFRAA 85 07/26/2020   QFTBGOLDPLUS NEGATIVE 10/18/2020    Speciality Comments: Enbrel discontinued August 2021-not covered by Medicare Cimzia- started 12/22/2019  Procedures:  No procedures performed Allergies: Patient has no known allergies.   Assessment / Plan:     Visit Diagnoses: Psoriatic arthropathy (HCC): She has no synovitis or dactylitis on examination. She has tenderness to palpation over both SI joints, right >left. She has been using a heating pad as needed and plans on establishing care with a new chiropractor.  She has no evidence of achilles tendonitis or plantar fasciitis.  She experiences intermittent arthralgias and joint stiffness especially with weather changes.  Overall her psoriatic arthritis is well controlled on Cimzia 400 mg sq injections every 28 days and methotrexate 5 tablets by mouth once weekly.  She reduce the dose of methotrexate from 7 tablets to 5 tablets weekly in December 2022 due to elevated creatinine and low GFR.  The patient was advised to avoid NSAID use.  She has not noticed any  increased joint pain or inflammation on the reduced dose of methotrexate.  No medication changes will be made at this time.  CBC and CMP will be updated today to monitor for drug toxicity.  She was advised to notify us if she develops signs or symptoms of a flare.  She will follow-up in the office in 5 months.  High risk medication use - Cimzia 400 mg sq injections every 28 days, Methotrexate 5 tablets by mouth once weekly.  D/c Enbrel-August 2021 - Plan: CBC with Differential/Platelet, COMPLETE METABOLIC PANEL WITH GFR CBC and CMP drawn on 01/10/2021.  She is due to update lab work today.  Orders for CBC and CMP were released.  Her next lab work will be due in June and every 3 months to monitor for drug toxicity.  TB Gold negative on 10/18/2020. She has not had any recent infections.  Discussed the importance of holding Cimzia and methotrexate if she develops signs or symptoms of an infection and to resume once the infection has completely cleared.   Psoriasis: She has no active psoriasis at this time.  DDD (degenerative disc disease), cervical: She has slightly limited range of motion with lateral rotation.  No symptoms of radiculopathy.  Other medical conditions are listed as follows:  History of restless legs syndrome  History of esophagitis  Osteopenia of multiple sites - DEXA updated on 05/25/20 and results were reviewed today in the office: RFN BMD 0.645 with T-score -1.8.   Orders: Orders Placed This Encounter  Procedures   CBC with Differential/Platelet   COMPLETE METABOLIC PANEL WITH GFR   No orders of the defined types were placed in this encounter.     Follow-Up Instructions: Return in about 5 months (around 09/07/2021) for Psoriatic arthritis.   Gearldine Bienenstock, PA-C  Note - This record has been created using Dragon software.  Chart creation errors have been sought, but may not always  have been located. Such creation errors do not reflect on  the standard of medical care.

## 2021-03-28 ENCOUNTER — Ambulatory Visit: Payer: Medicare Other | Admitting: Rheumatology

## 2021-04-07 ENCOUNTER — Encounter: Payer: Self-pay | Admitting: Physician Assistant

## 2021-04-07 ENCOUNTER — Ambulatory Visit: Payer: Medicare Other

## 2021-04-07 ENCOUNTER — Other Ambulatory Visit: Payer: Self-pay

## 2021-04-07 ENCOUNTER — Ambulatory Visit (INDEPENDENT_AMBULATORY_CARE_PROVIDER_SITE_OTHER): Payer: Medicare Other | Admitting: Physician Assistant

## 2021-04-07 VITALS — BP 148/96 | HR 71 | Ht 62.0 in | Wt 179.6 lb

## 2021-04-07 DIAGNOSIS — L409 Psoriasis, unspecified: Secondary | ICD-10-CM

## 2021-04-07 DIAGNOSIS — M5030 Other cervical disc degeneration, unspecified cervical region: Secondary | ICD-10-CM | POA: Diagnosis not present

## 2021-04-07 DIAGNOSIS — Z79899 Other long term (current) drug therapy: Secondary | ICD-10-CM | POA: Diagnosis not present

## 2021-04-07 DIAGNOSIS — M8589 Other specified disorders of bone density and structure, multiple sites: Secondary | ICD-10-CM

## 2021-04-07 DIAGNOSIS — L4050 Arthropathic psoriasis, unspecified: Secondary | ICD-10-CM | POA: Diagnosis not present

## 2021-04-07 DIAGNOSIS — Z8719 Personal history of other diseases of the digestive system: Secondary | ICD-10-CM

## 2021-04-07 DIAGNOSIS — Z8669 Personal history of other diseases of the nervous system and sense organs: Secondary | ICD-10-CM

## 2021-04-07 LAB — CBC WITH DIFFERENTIAL/PLATELET
Absolute Monocytes: 626 cells/uL (ref 200–950)
Basophils Absolute: 61 cells/uL (ref 0–200)
Basophils Relative: 0.7 %
Eosinophils Absolute: 218 cells/uL (ref 15–500)
Eosinophils Relative: 2.5 %
HCT: 41.4 % (ref 35.0–45.0)
Hemoglobin: 14 g/dL (ref 11.7–15.5)
Lymphs Abs: 3454 cells/uL (ref 850–3900)
MCH: 30.4 pg (ref 27.0–33.0)
MCHC: 33.8 g/dL (ref 32.0–36.0)
MCV: 90 fL (ref 80.0–100.0)
MPV: 11.6 fL (ref 7.5–12.5)
Monocytes Relative: 7.2 %
Neutro Abs: 4341 cells/uL (ref 1500–7800)
Neutrophils Relative %: 49.9 %
Platelets: 219 10*3/uL (ref 140–400)
RBC: 4.6 10*6/uL (ref 3.80–5.10)
RDW: 14.9 % (ref 11.0–15.0)
Total Lymphocyte: 39.7 %
WBC: 8.7 10*3/uL (ref 3.8–10.8)

## 2021-04-07 LAB — COMPLETE METABOLIC PANEL WITH GFR
AG Ratio: 1.8 (calc) (ref 1.0–2.5)
ALT: 25 U/L (ref 6–29)
AST: 18 U/L (ref 10–35)
Albumin: 4.4 g/dL (ref 3.6–5.1)
Alkaline phosphatase (APISO): 71 U/L (ref 37–153)
BUN: 16 mg/dL (ref 7–25)
CO2: 28 mmol/L (ref 20–32)
Calcium: 9.5 mg/dL (ref 8.6–10.4)
Chloride: 105 mmol/L (ref 98–110)
Creat: 0.86 mg/dL (ref 0.50–1.05)
Globulin: 2.5 g/dL (calc) (ref 1.9–3.7)
Glucose, Bld: 75 mg/dL (ref 65–99)
Potassium: 4.2 mmol/L (ref 3.5–5.3)
Sodium: 141 mmol/L (ref 135–146)
Total Bilirubin: 0.5 mg/dL (ref 0.2–1.2)
Total Protein: 6.9 g/dL (ref 6.1–8.1)
eGFR: 74 mL/min/{1.73_m2} (ref >=60)

## 2021-04-07 MED ORDER — CERTOLIZUMAB PEGOL 2 X 200 MG ~~LOC~~ KIT
400.0000 mg | PACK | Freq: Once | SUBCUTANEOUS | Status: AC
  Administered 2021-04-07: 400 mg via SUBCUTANEOUS

## 2021-04-07 NOTE — Patient Instructions (Signed)
Standing Labs ?We placed an order today for your standing lab work.  ? ?Please have your standing labs drawn in June and every 3 months  ? ?If possible, please have your labs drawn 2 weeks prior to your appointment so that the provider can discuss your results at your appointment. ? ?Please note that you may see your imaging and lab results in MyChart before we have reviewed them. ?We may be awaiting multiple results to interpret others before contacting you. ?Please allow our office up to 72 hours to thoroughly review all of the results before contacting the office for clarification of your results. ? ?We have open lab daily: ?Monday through Thursday from 1:30-4:30 PM and Friday from 1:30-4:00 PM ?at the office of Dr. Shaili Deveshwar, [facility].   ?Please be advised, all patients with office appointments requiring lab work will take precedent over walk-in lab work.  ?If possible, please come for your lab work on Monday and Friday afternoons, as you may experience shorter wait times. ?The office is located at [address], [address], [place], [place] [address] ?No appointment is necessary.   ?Labs are drawn by [facility]. Please bring your co-pay at the time of your lab draw.  You may receive a bill from [facility] for your lab work. ? ?Please note if you are on Hydroxychloroquine and and an order has been placed for a Hydroxychloroquine level, you will need to have it drawn 4 hours or more after your last dose. ? ?If you wish to have your labs drawn at another location, please call the office 24 hours in advance to send orders. ? ?If you have any questions regarding directions or hours of operation,  ?please call [phone].   ?As a reminder, please drink plenty of water prior to coming for your lab work. Thanks! ? ?

## 2021-04-07 NOTE — Progress Notes (Signed)
Pharmacy Note ? ?Subjective:   ?Patient presents to clinic today to receive monthly dose of Cimzia. ? ?Patient running a fever or have signs/symptoms of infection? No ? ?Patient currently on antibiotics for the treatment of infection? No ? ?Patient have any upcoming invasive procedures/surgeries? No ? ?Objective: ?CMP  ?   ?Component Value Date/Time  ? NA 141 01/10/2021 1411  ? K 3.9 01/10/2021 1411  ? CL 105 01/10/2021 1411  ? CO2 27 01/10/2021 1411  ? GLUCOSE 92 01/10/2021 1411  ? BUN 12 01/10/2021 1411  ? CREATININE 1.15 (H) 01/10/2021 1411  ? CALCIUM 8.9 01/10/2021 1411  ? PROT 6.6 01/10/2021 1411  ? ALBUMIN 4.4 08/07/2016 0911  ? AST 25 01/10/2021 1411  ? ALT 27 01/10/2021 1411  ? ALKPHOS 82 08/07/2016 0911  ? BILITOT 0.5 01/10/2021 1411  ? GFRNONAA 73 07/26/2020 1421  ? GFRAA 85 07/26/2020 1421  ? ? ?CBC ?   ?Component Value Date/Time  ? WBC 6.5 01/10/2021 1411  ? RBC 4.61 01/10/2021 1411  ? HGB 13.7 01/10/2021 1411  ? HGB 14.4 11/25/2013 0943  ? HCT 41.1 01/10/2021 1411  ? PLT 184 01/10/2021 1411  ? MCV 89.2 01/10/2021 1411  ? MCH 29.7 01/10/2021 1411  ? MCHC 33.3 01/10/2021 1411  ? RDW 14.6 01/10/2021 1411  ? LYMPHSABS 1,794 01/10/2021 1411  ? MONOABS 560 08/07/2016 0911  ? EOSABS 117 01/10/2021 1411  ? BASOSABS 33 01/10/2021 1411  ? ? ?Baseline Immunosuppressant Therapy Labs ?TB GOLD ?Quantiferon TB Gold Latest Ref Rng & Units 10/18/2020  ?Quantiferon TB Gold Plus NEGATIVE NEGATIVE  ? ?Hepatitis Panel ?Hepatitis Latest Ref Rng & Units 08/07/2016  ?Hep B Surface Ag NEGATIVE NEGATIVE  ?Hep B IgM NON REACTIVE NON REACTIVE  ?Hep C Ab NEGATIVE NEGATIVE  ? ?HIV ?Lab Results  ?Component Value Date  ? HIV NONREACTIVE 08/07/2016  ? ?Immunoglobulins ?Immunoglobulin Electrophoresis Latest Ref Rng & Units 08/07/2016  ?IgG 694 - 1,618 mg/dL 1,114  ?IgM 48 - 271 mg/dL 151  ? ?SPEP ?Serum Protein Electrophoresis Latest Ref Rng & Units 01/10/2021  ?Total Protein 6.1 - 8.1 g/dL 6.6  ?Albumin 3.8 - 4.8 g/dL -  ?Alpha-1 0.2 - 0.3  g/dL -  ?Alpha-2 0.5 - 0.9 g/dL -  ?Beta Globulin 0.4 - 0.6 g/dL -  ?Beta 2 0.2 - 0.5 g/dL -  ?Gamma Globulin 0.8 - 1.7 g/dL -  ?Interpretation - -  ? ?G6PD ?No results found for: G6PDH ?TPMT ?No results found for: TPMT  ? ?Chest x-ray: 02/03/2011 No active disease ? ?Assessment/Plan:  ? ?Administrations This Visit   ? ? certolizumab pegol (CIMZIA) kit 400 mg   ? ? Admin Date ?04/07/2021 Action ?Given Dose ?400 mg Route ?Subcutaneous Administered By ?Carole Binning, LPN  ? ?  ?  ? ?  ?  ? ?Patient tolerated injection well.  ? ?Appointment for next injection scheduled for 05/05/2021.  Patient due for labs today and were drawn while in office.  Patient is to call and reschedule appointment if running a fever with signs/symptoms of infection, on antibiotics for active infection or has an upcoming invasive procedure. ? ?All questions encouraged and answered.  Instructed patient to call with any further questions or concerns. ?  ?

## 2021-04-07 NOTE — Addendum Note (Signed)
Addended by: Carole Binning on: 04/07/2021 03:22 PM ? ? Modules accepted: Orders ? ?

## 2021-04-08 NOTE — Progress Notes (Signed)
CBC and CMP WNL

## 2021-05-05 ENCOUNTER — Ambulatory Visit (INDEPENDENT_AMBULATORY_CARE_PROVIDER_SITE_OTHER): Payer: Medicare Other | Admitting: *Deleted

## 2021-05-05 ENCOUNTER — Ambulatory Visit: Payer: Medicare Other

## 2021-05-05 VITALS — BP 134/92 | HR 80

## 2021-05-05 DIAGNOSIS — L4050 Arthropathic psoriasis, unspecified: Secondary | ICD-10-CM

## 2021-05-05 MED ORDER — CERTOLIZUMAB PEGOL 2 X 200 MG ~~LOC~~ KIT
400.0000 mg | PACK | Freq: Once | SUBCUTANEOUS | Status: AC
  Administered 2021-05-05: 400 mg via SUBCUTANEOUS

## 2021-05-05 NOTE — Progress Notes (Signed)
Subjective:   ?Patient presents to clinic today to receive monthly dose of Cimzia. ? ?Patient running a fever or have signs/symptoms of infection? No ? ?Patient currently on antibiotics for the treatment of infection? No ? ?Patient have any upcoming invasive procedures/surgeries? No ? ?Objective: ?CMP  ?   ?Component Value Date/Time  ? NA 141 04/07/2021 1510  ? K 4.2 04/07/2021 1510  ? CL 105 04/07/2021 1510  ? CO2 28 04/07/2021 1510  ? GLUCOSE 75 04/07/2021 1510  ? BUN 16 04/07/2021 1510  ? CREATININE 0.86 04/07/2021 1510  ? CALCIUM 9.5 04/07/2021 1510  ? PROT 6.9 04/07/2021 1510  ? ALBUMIN 4.4 08/07/2016 0911  ? AST 18 04/07/2021 1510  ? ALT 25 04/07/2021 1510  ? ALKPHOS 82 08/07/2016 0911  ? BILITOT 0.5 04/07/2021 1510  ? GFRNONAA 73 07/26/2020 1421  ? GFRAA 85 07/26/2020 1421  ? ? ?CBC ?   ?Component Value Date/Time  ? WBC 8.7 04/07/2021 1510  ? RBC 4.60 04/07/2021 1510  ? HGB 14.0 04/07/2021 1510  ? HGB 14.4 11/25/2013 0943  ? HCT 41.4 04/07/2021 1510  ? PLT 219 04/07/2021 1510  ? MCV 90.0 04/07/2021 1510  ? MCH 30.4 04/07/2021 1510  ? MCHC 33.8 04/07/2021 1510  ? RDW 14.9 04/07/2021 1510  ? LYMPHSABS 3,454 04/07/2021 1510  ? MONOABS 560 08/07/2016 0911  ? EOSABS 218 04/07/2021 1510  ? BASOSABS 61 04/07/2021 1510  ? ? ?Baseline Immunosuppressant Therapy Labs ?TB GOLD ? ?  Latest Ref Rng & Units 10/18/2020  ? 10:09 AM  ?Quantiferon TB Gold  ?Quantiferon TB Gold Plus NEGATIVE NEGATIVE    ? ?Hepatitis Panel ? ?  Latest Ref Rng & Units 08/07/2016  ?  9:11 AM  ?Hepatitis  ?Hep B Surface Ag NEGATIVE NEGATIVE    ?Hep B IgM NON REACTIVE NON REACTIVE    ?Hep C Ab NEGATIVE NEGATIVE    ? ?HIV ?Lab Results  ?Component Value Date  ? HIV NONREACTIVE 08/07/2016  ? ?Immunoglobulins ? ?  Latest Ref Rng & Units 08/07/2016  ?  9:11 AM  ?Immunoglobulin Electrophoresis  ?IgG 694 - 1,618 mg/dL 1,114    ?IgM 48 - 271 mg/dL 151    ? ?SPEP ? ?  Latest Ref Rng & Units 04/07/2021  ?  3:10 PM  ?Serum Protein Electrophoresis  ?Total Protein 6.1 -  8.1 g/dL 6.9    ? ?G6PD ?No results found for: G6PDH ?TPMT ?No results found for: TPMT  ? ?Chest x-ray: 02/03/2011 No active disease ? ?Assessment/Plan:  ? ?Administrations This Visit   ? ? certolizumab pegol (CIMZIA) kit 400 mg   ? ? Admin Date ?05/05/2021 Action ?Given Dose ?400 mg Route ?Subcutaneous Administered By ?Carole Binning, LPN  ? ?  ?  ? ?  ?  ? ?Patient tolerated injection well.  ? ?Appointment for next injection scheduled for 06/02/2021.  Patient due for labs in June 2023.  Patient is to call and reschedule appointment if running a fever with signs/symptoms of infection, on antibiotics for active infection or has an upcoming invasive procedure. ? ?All questions encouraged and answered.  Instructed patient to call with any further questions or concerns. ?  ?

## 2021-05-06 ENCOUNTER — Telehealth: Payer: Self-pay | Admitting: Pharmacist

## 2021-05-06 NOTE — Telephone Encounter (Signed)
Submitted request for Verification of Benefits for patient's in-office Cimzia via [facility] portal ?  ?Harshita Bernales, PharmD, MPH, BCPS ?Clinical Pharmacist (Rheumatology and Pulmonology) ?

## 2021-05-09 NOTE — Telephone Encounter (Signed)
Received fax from [place] with Verification of Benefits. ? ?For in-network providers, Cimzia has 20% coinsurance through Medicare Part B. CPT code [id number] is valid and billable with 20% co-insurance. Prior authorization is not required. ? ?Patient has active Medicare supplemental plan G though [employer].  This covers Part B coinsurance. Supplement does not cover Part B deductible. ? ?Knox Saliva, PharmD, MPH, BCPS ?Clinical Pharmacist (Rheumatology and Pulmonology) ?

## 2021-05-16 ENCOUNTER — Ambulatory Visit
Admission: RE | Admit: 2021-05-16 | Discharge: 2021-05-16 | Disposition: A | Discharge status: Home / Self Care | Payer: Medicare Other | Source: Ambulatory Visit | Attending: Nurse Practitioner | Admitting: Nurse Practitioner

## 2021-05-16 DIAGNOSIS — Z1231 Encounter for screening mammogram for malignant neoplasm of breast: Secondary | ICD-10-CM

## 2021-05-17 ENCOUNTER — Other Ambulatory Visit: Payer: Self-pay | Admitting: Nurse Practitioner

## 2021-05-17 DIAGNOSIS — R928 Other abnormal and inconclusive findings on diagnostic imaging of breast: Secondary | ICD-10-CM

## 2021-05-26 ENCOUNTER — Ambulatory Visit
Admission: RE | Admit: 2021-05-26 | Discharge: 2021-05-26 | Disposition: A | Discharge status: Home / Self Care | Payer: Medicare Other | Source: Ambulatory Visit | Attending: Nurse Practitioner | Admitting: Nurse Practitioner

## 2021-05-26 DIAGNOSIS — R928 Other abnormal and inconclusive findings on diagnostic imaging of breast: Secondary | ICD-10-CM

## 2021-06-02 ENCOUNTER — Ambulatory Visit (INDEPENDENT_AMBULATORY_CARE_PROVIDER_SITE_OTHER): Payer: Medicare Other | Admitting: *Deleted

## 2021-06-02 VITALS — BP 129/92 | HR 91

## 2021-06-02 DIAGNOSIS — L4050 Arthropathic psoriasis, unspecified: Secondary | ICD-10-CM

## 2021-06-02 DIAGNOSIS — L4059 Other psoriatic arthropathy: Secondary | ICD-10-CM

## 2021-06-02 DIAGNOSIS — M0579 Rheumatoid arthritis with rheumatoid factor of multiple sites without organ or systems involvement: Secondary | ICD-10-CM | POA: Diagnosis not present

## 2021-06-02 MED ORDER — CERTOLIZUMAB PEGOL 2 X 200 MG ~~LOC~~ KIT
400.0000 mg | PACK | Freq: Once | SUBCUTANEOUS | Status: AC
  Administered 2021-06-02: 400 mg via SUBCUTANEOUS

## 2021-06-02 NOTE — Progress Notes (Signed)
Pharmacy Note ? ?Subjective:   ?Patient presents to clinic today to receive monthly dose of Cimzia. ? ?Patient running a fever or have signs/symptoms of infection? No ? ?Patient currently on antibiotics for the treatment of infection? No ? ?Patient have any upcoming invasive procedures/surgeries? No ? ?Objective: ?CMP  ?   ?Component Value Date/Time  ? NA 141 04/07/2021 1510  ? K 4.2 04/07/2021 1510  ? CL 105 04/07/2021 1510  ? CO2 28 04/07/2021 1510  ? GLUCOSE 75 04/07/2021 1510  ? BUN 16 04/07/2021 1510  ? CREATININE 0.86 04/07/2021 1510  ? CALCIUM 9.5 04/07/2021 1510  ? PROT 6.9 04/07/2021 1510  ? ALBUMIN 4.4 08/07/2016 0911  ? AST 18 04/07/2021 1510  ? ALT 25 04/07/2021 1510  ? ALKPHOS 82 08/07/2016 0911  ? BILITOT 0.5 04/07/2021 1510  ? GFRNONAA 73 07/26/2020 1421  ? GFRAA 85 07/26/2020 1421  ? ? ?CBC ?   ?Component Value Date/Time  ? WBC 8.7 04/07/2021 1510  ? RBC 4.60 04/07/2021 1510  ? HGB 14.0 04/07/2021 1510  ? HGB 14.4 11/25/2013 0943  ? HCT 41.4 04/07/2021 1510  ? PLT 219 04/07/2021 1510  ? MCV 90.0 04/07/2021 1510  ? MCH 30.4 04/07/2021 1510  ? MCHC 33.8 04/07/2021 1510  ? RDW 14.9 04/07/2021 1510  ? LYMPHSABS 3,454 04/07/2021 1510  ? MONOABS 560 08/07/2016 0911  ? EOSABS 218 04/07/2021 1510  ? BASOSABS 61 04/07/2021 1510  ? ? ?Baseline Immunosuppressant Therapy Labs ?TB GOLD ? ?  Latest Ref Rng & Units 10/18/2020  ? 10:09 AM  ?Quantiferon TB Gold  ?Quantiferon TB Gold Plus NEGATIVE NEGATIVE    ? ?Hepatitis Panel ? ?  Latest Ref Rng & Units 08/07/2016  ?  9:11 AM  ?Hepatitis  ?Hep B Surface Ag NEGATIVE NEGATIVE    ?Hep B IgM NON REACTIVE NON REACTIVE    ?Hep C Ab NEGATIVE NEGATIVE    ? ?HIV ?Lab Results  ?Component Value Date  ? HIV NONREACTIVE 08/07/2016  ? ?Immunoglobulins ? ?  Latest Ref Rng & Units 08/07/2016  ?  9:11 AM  ?Immunoglobulin Electrophoresis  ?IgG 694 - 1,618 mg/dL 1,114    ?IgM 48 - 271 mg/dL 151    ? ?SPEP ? ?  Latest Ref Rng & Units 04/07/2021  ?  3:10 PM  ?Serum Protein Electrophoresis   ?Total Protein 6.1 - 8.1 g/dL 6.9    ? ?G6PD ?No results found for: G6PDH ?TPMT ?No results found for: TPMT  ? ?Chest x-ray: 02/03/2011 No active disease ? ?Assessment/Plan:  ? ?Administrations This Visit   ? ? certolizumab pegol (CIMZIA) kit 400 mg   ? ? Admin Date ?06/02/2021 Action ?Given Dose ?400 mg Route ?Subcutaneous Administered By ?Carole Binning, LPN  ? ?  ?  ? ?  ?  ? ?Patient tolerated injection well.  ? ?Appointment for next injection scheduled for 06/30/2021.  Patient due for labs in June 2023.  Patient is to call and reschedule appointment if running a fever with signs/symptoms of infection, on antibiotics for active infection or has an upcoming invasive procedure. ? ?All questions encouraged and answered.  Instructed patient to call with any further questions or concerns. ?  ?

## 2021-06-30 ENCOUNTER — Ambulatory Visit (INDEPENDENT_AMBULATORY_CARE_PROVIDER_SITE_OTHER): Payer: Medicare Other | Admitting: *Deleted

## 2021-06-30 VITALS — BP 142/92 | HR 86

## 2021-06-30 DIAGNOSIS — Z79899 Other long term (current) drug therapy: Secondary | ICD-10-CM | POA: Diagnosis not present

## 2021-06-30 DIAGNOSIS — L4050 Arthropathic psoriasis, unspecified: Secondary | ICD-10-CM

## 2021-06-30 DIAGNOSIS — M0579 Rheumatoid arthritis with rheumatoid factor of multiple sites without organ or systems involvement: Secondary | ICD-10-CM | POA: Diagnosis not present

## 2021-06-30 DIAGNOSIS — L4059 Other psoriatic arthropathy: Secondary | ICD-10-CM

## 2021-06-30 MED ORDER — CERTOLIZUMAB PEGOL 2 X 200 MG ~~LOC~~ KIT
400.0000 mg | PACK | Freq: Once | SUBCUTANEOUS | Status: AC
  Administered 2021-06-30: 400 mg via SUBCUTANEOUS

## 2021-06-30 NOTE — Progress Notes (Signed)
Pharmacy Note  Subjective:   Patient presents to clinic today to receive monthly dose of Cimzia.  Patient running a fever or have signs/symptoms of infection? No  Patient currently on antibiotics for the treatment of infection? No  Patient have any upcoming invasive procedures/surgeries? No  Objective: CMP     Component Value Date/Time   NA 141 04/07/2021 1510   K 4.2 04/07/2021 1510   CL 105 04/07/2021 1510   CO2 28 04/07/2021 1510   GLUCOSE 75 04/07/2021 1510   BUN 16 04/07/2021 1510   CREATININE 0.86 04/07/2021 1510   CALCIUM 9.5 04/07/2021 1510   PROT 6.9 04/07/2021 1510   ALBUMIN 4.4 08/07/2016 0911   AST 18 04/07/2021 1510   ALT 25 04/07/2021 1510   ALKPHOS 82 08/07/2016 0911   BILITOT 0.5 04/07/2021 1510   GFRNONAA 73 07/26/2020 1421   GFRAA 85 07/26/2020 1421    CBC    Component Value Date/Time   WBC 8.7 04/07/2021 1510   RBC 4.60 04/07/2021 1510   HGB 14.0 04/07/2021 1510   HGB 14.4 11/25/2013 0943   HCT 41.4 04/07/2021 1510   PLT 219 04/07/2021 1510   MCV 90.0 04/07/2021 1510   MCH 30.4 04/07/2021 1510   MCHC 33.8 04/07/2021 1510   RDW 14.9 04/07/2021 1510   LYMPHSABS 3,454 04/07/2021 1510   MONOABS 560 08/07/2016 0911   EOSABS 218 04/07/2021 1510   BASOSABS 61 04/07/2021 1510    Baseline Immunosuppressant Therapy Labs TB GOLD    Latest Ref Rng & Units 10/18/2020   10:09 AM  Quantiferon TB Gold  Quantiferon TB Gold Plus NEGATIVE NEGATIVE     Hepatitis Panel    Latest Ref Rng & Units 08/07/2016    9:11 AM  Hepatitis  Hep B Surface Ag NEGATIVE NEGATIVE    Hep B IgM NON REACTIVE NON REACTIVE    Hep C Ab NEGATIVE NEGATIVE     HIV Lab Results  Component Value Date   HIV NONREACTIVE 08/07/2016   Immunoglobulins    Latest Ref Rng & Units 08/07/2016    9:11 AM  Immunoglobulin Electrophoresis  IgG 694 - 1,618 mg/dL 1,114    IgM 48 - 271 mg/dL 151     SPEP    Latest Ref Rng & Units 04/07/2021    3:10 PM  Serum Protein Electrophoresis   Total Protein 6.1 - 8.1 g/dL 6.9     G6PD No results found for: G6PDH TPMT No results found for: TPMT   Chest x-ray: 02/03/2011 No active disease  Assessment/Plan:   Administrations This Visit     certolizumab pegol (CIMZIA) kit 400 mg     Admin Date 06/30/2021 Action Given Dose 400 mg Route Subcutaneous Administered By Carole Binning, LPN             Patient tolerated injection well.   Appointment for next injection scheduled for 07/28/2021.  Patient due for labs in June 2023.  Patient is to call and reschedule appointment if running a fever with signs/symptoms of infection, on antibiotics for active infection or has an upcoming invasive procedure.  All questions encouraged and answered.  Instructed patient to call with any further questions or concerns.

## 2021-07-15 ENCOUNTER — Other Ambulatory Visit: Payer: Self-pay | Admitting: *Deleted

## 2021-07-15 DIAGNOSIS — L409 Psoriasis, unspecified: Secondary | ICD-10-CM

## 2021-07-15 DIAGNOSIS — L4050 Arthropathic psoriasis, unspecified: Secondary | ICD-10-CM

## 2021-07-15 DIAGNOSIS — Z79899 Other long term (current) drug therapy: Secondary | ICD-10-CM

## 2021-07-16 LAB — COMPLETE METABOLIC PANEL WITH GFR
AG Ratio: 1.9 (calc) (ref 1.0–2.5)
ALT: 20 U/L (ref 6–29)
AST: 17 U/L (ref 10–35)
Albumin: 4.5 g/dL (ref 3.6–5.1)
Alkaline phosphatase (APISO): 73 U/L (ref 37–153)
BUN: 15 mg/dL (ref 7–25)
CO2: 27 mmol/L (ref 20–32)
Calcium: 9.3 mg/dL (ref 8.6–10.4)
Chloride: 105 mmol/L (ref 98–110)
Creat: 0.83 mg/dL (ref 0.50–1.05)
Globulin: 2.4 g/dL (calc) (ref 1.9–3.7)
Glucose, Bld: 86 mg/dL (ref 65–99)
Potassium: 4.3 mmol/L (ref 3.5–5.3)
Sodium: 140 mmol/L (ref 135–146)
Total Bilirubin: 0.6 mg/dL (ref 0.2–1.2)
Total Protein: 6.9 g/dL (ref 6.1–8.1)
eGFR: 78 mL/min/{1.73_m2} (ref >=60)

## 2021-07-16 LAB — CBC WITH DIFFERENTIAL/PLATELET
Absolute Monocytes: 549 cells/uL (ref 200–950)
Basophils Absolute: 49 cells/uL (ref 0–200)
Basophils Relative: 0.6 %
Eosinophils Absolute: 148 cells/uL (ref 15–500)
Eosinophils Relative: 1.8 %
HCT: 43.3 % (ref 35.0–45.0)
Hemoglobin: 14.4 g/dL (ref 11.7–15.5)
Lymphs Abs: 3329 cells/uL (ref 850–3900)
MCH: 29.9 pg (ref 27.0–33.0)
MCHC: 33.3 g/dL (ref 32.0–36.0)
MCV: 89.8 fL (ref 80.0–100.0)
MPV: 10.8 fL (ref 7.5–12.5)
Monocytes Relative: 6.7 %
Neutro Abs: 4125 cells/uL (ref 1500–7800)
Neutrophils Relative %: 50.3 %
Platelets: 249 10*3/uL (ref 140–400)
RBC: 4.82 10*6/uL (ref 3.80–5.10)
RDW: 14.1 % (ref 11.0–15.0)
Total Lymphocyte: 40.6 %
WBC: 8.2 10*3/uL (ref 3.8–10.8)

## 2021-07-16 NOTE — Progress Notes (Signed)
CBC and CMP are normal.

## 2021-07-28 ENCOUNTER — Ambulatory Visit (INDEPENDENT_AMBULATORY_CARE_PROVIDER_SITE_OTHER): Payer: Medicare Other | Admitting: *Deleted

## 2021-07-28 VITALS — BP 153/85 | HR 89

## 2021-07-28 DIAGNOSIS — L4050 Arthropathic psoriasis, unspecified: Secondary | ICD-10-CM

## 2021-07-28 MED ORDER — CERTOLIZUMAB PEGOL 2 X 200 MG ~~LOC~~ KIT
400.0000 mg | PACK | Freq: Once | SUBCUTANEOUS | Status: AC
  Administered 2021-07-28: 400 mg via SUBCUTANEOUS

## 2021-07-28 NOTE — Progress Notes (Signed)
/  Pharmacy Note  Subjective:   Patient presents to clinic today to receive monthly dose of Cimzia.  Patient running a fever or have signs/symptoms of infection? No  Patient currently on antibiotics for the treatment of infection? No  Patient have any upcoming invasive procedures/surgeries? No  Objective: CMP     Component Value Date/Time   NA 140 07/15/2021 1419   K 4.3 07/15/2021 1419   CL 105 07/15/2021 1419   CO2 27 07/15/2021 1419   GLUCOSE 86 07/15/2021 1419   BUN 15 07/15/2021 1419   CREATININE 0.83 07/15/2021 1419   CALCIUM 9.3 07/15/2021 1419   PROT 6.9 07/15/2021 1419   ALBUMIN 4.4 08/07/2016 0911   AST 17 07/15/2021 1419   ALT 20 07/15/2021 1419   ALKPHOS 82 08/07/2016 0911   BILITOT 0.6 07/15/2021 1419   GFRNONAA 73 07/26/2020 1421   GFRAA 85 07/26/2020 1421    CBC    Component Value Date/Time   WBC 8.2 07/15/2021 1419   RBC 4.82 07/15/2021 1419   HGB 14.4 07/15/2021 1419   HGB 14.4 11/25/2013 0943   HCT 43.3 07/15/2021 1419   PLT 249 07/15/2021 1419   MCV 89.8 07/15/2021 1419   MCH 29.9 07/15/2021 1419   MCHC 33.3 07/15/2021 1419   RDW 14.1 07/15/2021 1419   LYMPHSABS 3,329 07/15/2021 1419   MONOABS 560 08/07/2016 0911   EOSABS 148 07/15/2021 1419   BASOSABS 49 07/15/2021 1419    Baseline Immunosuppressant Therapy Labs TB GOLD    Latest Ref Rng & Units 10/18/2020   10:09 AM  Quantiferon TB Gold  Quantiferon TB Gold Plus NEGATIVE NEGATIVE    Hepatitis Panel    Latest Ref Rng & Units 08/07/2016    9:11 AM  Hepatitis  Hep B Surface Ag NEGATIVE NEGATIVE   Hep B IgM NON REACTIVE NON REACTIVE   Hep C Ab NEGATIVE NEGATIVE    HIV Lab Results  Component Value Date   HIV NONREACTIVE 08/07/2016   Immunoglobulins    Latest Ref Rng & Units 08/07/2016    9:11 AM  Immunoglobulin Electrophoresis  IgG 694 - 1,618 mg/dL 1,114   IgM 48 - 271 mg/dL 151    SPEP    Latest Ref Rng & Units 07/15/2021    2:19 PM  Serum Protein Electrophoresis  Total  Protein 6.1 - 8.1 g/dL 6.9    G6PD No results found for: "G6PDH" TPMT No results found for: "TPMT"   Chest x-ray: 02/03/2011 No active disease  Assessment/Plan:   Administrations This Visit     certolizumab pegol (CIMZIA) kit 400 mg     Admin Date 07/28/2021 Action Given Dose 400 mg Route Subcutaneous Administered By Carole Binning, LPN             Patient tolerated injection well.   Appointment for next injection scheduled for August 25, 2021.  Patient due for labs in September 2023.  Patient is to call and reschedule appointment if running a fever with signs/symptoms of infection, on antibiotics for active infection or has an upcoming invasive procedure.  All questions encouraged and answered.  Instructed patient to call with any further questions or concerns.

## 2021-08-25 ENCOUNTER — Ambulatory Visit (INDEPENDENT_AMBULATORY_CARE_PROVIDER_SITE_OTHER): Payer: Medicare Other | Admitting: *Deleted

## 2021-08-25 VITALS — BP 123/88 | HR 79

## 2021-08-25 DIAGNOSIS — L4050 Arthropathic psoriasis, unspecified: Secondary | ICD-10-CM

## 2021-08-25 MED ORDER — CERTOLIZUMAB PEGOL 2 X 200 MG ~~LOC~~ KIT
400.0000 mg | PACK | Freq: Once | SUBCUTANEOUS | Status: AC
  Administered 2021-08-25: 400 mg via SUBCUTANEOUS

## 2021-08-25 NOTE — Progress Notes (Signed)
Subjective:   Patient presents to clinic today to receive monthly dose of Cimzia.  Patient running a fever or have signs/symptoms of infection? No  Patient currently on antibiotics for the treatment of infection? No  Patient have any upcoming invasive procedures/surgeries? No  Objective: CMP     Component Value Date/Time   NA 140 07/15/2021 1419   K 4.3 07/15/2021 1419   CL 105 07/15/2021 1419   CO2 27 07/15/2021 1419   GLUCOSE 86 07/15/2021 1419   BUN 15 07/15/2021 1419   CREATININE 0.83 07/15/2021 1419   CALCIUM 9.3 07/15/2021 1419   PROT 6.9 07/15/2021 1419   ALBUMIN 4.4 08/07/2016 0911   AST 17 07/15/2021 1419   ALT 20 07/15/2021 1419   ALKPHOS 82 08/07/2016 0911   BILITOT 0.6 07/15/2021 1419   GFRNONAA 73 07/26/2020 1421   GFRAA 85 07/26/2020 1421    CBC    Component Value Date/Time   WBC 8.2 07/15/2021 1419   RBC 4.82 07/15/2021 1419   HGB 14.4 07/15/2021 1419   HGB 14.4 11/25/2013 0943   HCT 43.3 07/15/2021 1419   PLT 249 07/15/2021 1419   MCV 89.8 07/15/2021 1419   MCH 29.9 07/15/2021 1419   MCHC 33.3 07/15/2021 1419   RDW 14.1 07/15/2021 1419   LYMPHSABS 3,329 07/15/2021 1419   MONOABS 560 08/07/2016 0911   EOSABS 148 07/15/2021 1419   BASOSABS 49 07/15/2021 1419    Baseline Immunosuppressant Therapy Labs TB GOLD    Latest Ref Rng & Units 10/18/2020   10:09 AM  Quantiferon TB Gold  Quantiferon TB Gold Plus NEGATIVE NEGATIVE    Hepatitis Panel    Latest Ref Rng & Units 08/07/2016    9:11 AM  Hepatitis  Hep B Surface Ag NEGATIVE NEGATIVE   Hep B IgM NON REACTIVE NON REACTIVE   Hep C Ab NEGATIVE NEGATIVE    HIV Lab Results  Component Value Date   HIV NONREACTIVE 08/07/2016   Immunoglobulins    Latest Ref Rng & Units 08/07/2016    9:11 AM  Immunoglobulin Electrophoresis  IgG 694 - 1,618 mg/dL 1,114   IgM 48 - 271 mg/dL 151    SPEP    Latest Ref Rng & Units 07/15/2021    2:19 PM  Serum Protein Electrophoresis  Total Protein 6.1 - 8.1  g/dL 6.9    G6PD No results found for: "G6PDH" TPMT No results found for: "TPMT"   Chest x-ray: 02/03/2011 No active disease  Assessment/Plan:   Administrations This Visit     certolizumab pegol (CIMZIA) kit 400 mg     Admin Date 08/25/2021 Action Given Dose 400 mg Route Subcutaneous Administered By Carole Binning, LPN             Patient tolerated injection well.   Appointment for next injection scheduled for 09/22/2021.  Patient due for labs in September 2023.  Patient is to call and reschedule appointment if running a fever with signs/symptoms of infection, on antibiotics for active infection or has an upcoming invasive procedure.  All questions encouraged and answered.  Instructed patient to call with any further questions or concerns.

## 2021-09-20 NOTE — Progress Notes (Deleted)
Office Visit Note  Patient: Cynthia Snyder             Date of Birth: 02-24-1955           MRN: [id number]             PCP: Chesley Noon, MD Referring: Chesley Noon, MD Visit Date: 10/04/2021 Occupation: '@GUAROCC'$ @  Subjective:  No chief complaint on file.   History of Present Illness: Cynthia Snyder is a 66 y.o. female ***   Activities of Daily Living:  Patient reports morning stiffness for *** {minute/hour:19697}.   Patient {ACTIONS;DENIES/REPORTS:21021675::"Denies"} nocturnal pain.  Difficulty dressing/grooming: {ACTIONS;DENIES/REPORTS:21021675::"Denies"} Difficulty climbing stairs: {ACTIONS;DENIES/REPORTS:21021675::"Denies"} Difficulty getting out of chair: {ACTIONS;DENIES/REPORTS:21021675::"Denies"} Difficulty using hands for taps, buttons, cutlery, and/or writing: {ACTIONS;DENIES/REPORTS:21021675::"Denies"}  No Rheumatology ROS completed.   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   Pain of right hip joint 03/26/2017   Psoriatic arthropathy ([place]) 11/07/2016   Psoriasis 08/04/2016   DJD (degenerative joint disease), cervical 08/04/2016   Esophagitis 08/04/2016   Restless leg syndrome 08/04/2016   High risk medication use 08/04/2016   Atrophic vaginitis 01/22/2015    Class: Chronic    Past Medical History:  Diagnosis Date   Abnormal Pap smear    many yrs ago   Endometrial cancer ([place])    Endometriosis    Esophagus disorder    Psoriatic arthritis ([place])    Shingles    Shingles     Family History  Problem Relation Age of Onset   Heart disease Mother    Hypertension Mother    Stroke Mother    Diabetes Mother    Osteoporosis Mother    Hypothyroidism Mother    Heart disease Father    Heart disease Brother    Stroke Brother    Cancer Paternal Aunt        ovarian   Colon cancer Maternal Grandmother    Breast cancer Neg Hx    Past Surgical History:   Procedure Laterality Date   ABDOMINAL HYSTERECTOMY     age 73   APPENDECTOMY     BILATERAL SALPINGOOPHORECTOMY Right 11/24/08   lysis of adhesion   CATARACT EXTRACTION, BILATERAL     esophageal stretched     EXPLORATORY LAPAROTOMY     jaw bone graft  2004   KNEE ARTHROPLASTY     KNEE SURGERY     LAPAROSCOPY     age 9   LIPOSUCTION TRUNK  04/06/2016   Dr. Lenon Curt for body image   LIVER BIOPSY     mild steatosis   NOSE SURGERY     Social History   Social History Narrative   Not on file   Immunization History  Administered Date(s) Administered   Influenza Inj Mdck Quad Pf 10/23/2017   Influenza Split 10/20/2017   Influenza, Seasonal, Injecte, Preservative Fre 10/29/2013, 12/08/2014, 11/18/2015   Moderna Sars-Covid-2 Vaccination 04/28/2019, 05/29/2019   Tdap 11/06/2012   Zoster, Live 10/01/2015     Objective: Vital Signs: LMP 02/06/1982    Physical Exam   Musculoskeletal Exam: ***  CDAI Exam: CDAI Score: -- Patient Global: --; Provider Global: -- Swollen: --; Tender: -- Joint Exam 10/04/2021   No joint exam has been documented for this visit   There is currently no information documented on the homunculus. Go to the Rheumatology activity and complete the homunculus joint exam.  Investigation: No additional findings.  Imaging: No results  found.  Recent Labs: Lab Results  Component Value Date   WBC 8.2 07/15/2021   HGB 14.4 07/15/2021   PLT 249 07/15/2021   NA 140 07/15/2021   K 4.3 07/15/2021   CL 105 07/15/2021   CO2 27 07/15/2021   GLUCOSE 86 07/15/2021   BUN 15 07/15/2021   CREATININE 0.83 07/15/2021   BILITOT 0.6 07/15/2021   ALKPHOS 82 08/07/2016   AST 17 07/15/2021   ALT 20 07/15/2021   PROT 6.9 07/15/2021   ALBUMIN 4.4 08/07/2016   CALCIUM 9.3 07/15/2021   GFRAA 85 07/26/2020   QFTBGOLDPLUS NEGATIVE 10/18/2020    Speciality Comments: Enbrel discontinued August 2021-not covered by Medicare Cimzia- started 12/22/2019  Procedures:   No procedures performed Allergies: Patient has no known allergies.   Assessment / Plan:     Visit Diagnoses: No diagnosis found.  Orders: No orders of the defined types were placed in this encounter.  No orders of the defined types were placed in this encounter.   Face-to-face time spent with patient was *** minutes. Greater than 50% of time was spent in counseling and coordination of care.  Follow-Up Instructions: No follow-ups on file.   Earnestine Mealing, CMA  Note - This record has been created using Editor, commissioning.  Chart creation errors have been sought, but may not always  have been located. Such creation errors do not reflect on  the standard of medical care.

## 2021-09-22 ENCOUNTER — Ambulatory Visit: Payer: Medicare Other | Attending: Rheumatology | Admitting: *Deleted

## 2021-09-22 VITALS — BP 151/90 | HR 80

## 2021-09-22 DIAGNOSIS — L4050 Arthropathic psoriasis, unspecified: Secondary | ICD-10-CM | POA: Insufficient documentation

## 2021-09-22 MED ORDER — CERTOLIZUMAB PEGOL 2 X 200 MG ~~LOC~~ KIT
400.0000 mg | PACK | Freq: Once | SUBCUTANEOUS | Status: AC
  Administered 2021-09-22: 400 mg via SUBCUTANEOUS

## 2021-09-22 NOTE — Progress Notes (Signed)
Pharmacy Note  Subjective:   Patient presents to clinic today to receive monthly dose of Cimzia.  Patient running a fever or have signs/symptoms of infection? No  Patient currently on antibiotics for the treatment of infection? No  Patient have any upcoming invasive procedures/surgeries? No  Objective: CMP     Component Value Date/Time   NA 140 07/15/2021 1419   K 4.3 07/15/2021 1419   CL 105 07/15/2021 1419   CO2 27 07/15/2021 1419   GLUCOSE 86 07/15/2021 1419   BUN 15 07/15/2021 1419   CREATININE 0.83 07/15/2021 1419   CALCIUM 9.3 07/15/2021 1419   PROT 6.9 07/15/2021 1419   ALBUMIN 4.4 08/07/2016 0911   AST 17 07/15/2021 1419   ALT 20 07/15/2021 1419   ALKPHOS 82 08/07/2016 0911   BILITOT 0.6 07/15/2021 1419   GFRNONAA 73 07/26/2020 1421   GFRAA 85 07/26/2020 1421    CBC    Component Value Date/Time   WBC 8.2 07/15/2021 1419   RBC 4.82 07/15/2021 1419   HGB 14.4 07/15/2021 1419   HGB 14.4 11/25/2013 0943   HCT 43.3 07/15/2021 1419   PLT 249 07/15/2021 1419   MCV 89.8 07/15/2021 1419   MCH 29.9 07/15/2021 1419   MCHC 33.3 07/15/2021 1419   RDW 14.1 07/15/2021 1419   LYMPHSABS 3,329 07/15/2021 1419   MONOABS 560 08/07/2016 0911   EOSABS 148 07/15/2021 1419   BASOSABS 49 07/15/2021 1419    Baseline Immunosuppressant Therapy Labs TB GOLD    Latest Ref Rng & Units 10/18/2020   10:09 AM  Quantiferon TB Gold  Quantiferon TB Gold Plus NEGATIVE NEGATIVE    Hepatitis Panel    Latest Ref Rng & Units 08/07/2016    9:11 AM  Hepatitis  Hep B Surface Ag NEGATIVE NEGATIVE   Hep B IgM NON REACTIVE NON REACTIVE   Hep C Ab NEGATIVE NEGATIVE    HIV Lab Results  Component Value Date   HIV NONREACTIVE 08/07/2016   Immunoglobulins    Latest Ref Rng & Units 08/07/2016    9:11 AM  Immunoglobulin Electrophoresis  IgG 694 - 1,618 mg/dL 1,114   IgM 48 - 271 mg/dL 151    SPEP    Latest Ref Rng & Units 07/15/2021    2:19 PM  Serum Protein Electrophoresis  Total  Protein 6.1 - 8.1 g/dL 6.9    G6PD No results found for: "G6PDH" TPMT No results found for: "TPMT"   Chest x-ray: 02/03/2011 No active disease  Assessment/Plan:   Administrations This Visit     certolizumab pegol (CIMZIA) kit 400 mg     Admin Date 09/22/2021 Action Given Dose 400 mg Route Subcutaneous Administered By Carole Binning, LPN             Patient tolerated injection well.   Appointment for next injection scheduled for 10/20/2021.  Patient due for labs in September 2023.  Patient is to call and reschedule appointment if running a fever with signs/symptoms of infection, on antibiotics for active infection or has an upcoming invasive procedure.  All questions encouraged and answered.  Instructed patient to call with any further questions or concerns.

## 2021-10-04 ENCOUNTER — Ambulatory Visit: Payer: Medicare Other | Admitting: Rheumatology

## 2021-10-04 ENCOUNTER — Encounter: Payer: Self-pay | Admitting: Physician Assistant

## 2021-10-04 ENCOUNTER — Ambulatory Visit: Payer: Medicare Other | Attending: Rheumatology | Admitting: Physician Assistant

## 2021-10-04 VITALS — BP 120/80 | HR 88 | Resp 14 | Ht 62.0 in | Wt 175.0 lb

## 2021-10-04 DIAGNOSIS — Z8669 Personal history of other diseases of the nervous system and sense organs: Secondary | ICD-10-CM | POA: Diagnosis present

## 2021-10-04 DIAGNOSIS — L4050 Arthropathic psoriasis, unspecified: Secondary | ICD-10-CM

## 2021-10-04 DIAGNOSIS — L409 Psoriasis, unspecified: Secondary | ICD-10-CM

## 2021-10-04 DIAGNOSIS — Z8719 Personal history of other diseases of the digestive system: Secondary | ICD-10-CM

## 2021-10-04 DIAGNOSIS — M5030 Other cervical disc degeneration, unspecified cervical region: Secondary | ICD-10-CM

## 2021-10-04 DIAGNOSIS — M8589 Other specified disorders of bone density and structure, multiple sites: Secondary | ICD-10-CM

## 2021-10-04 DIAGNOSIS — Z111 Encounter for screening for respiratory tuberculosis: Secondary | ICD-10-CM | POA: Diagnosis present

## 2021-10-04 DIAGNOSIS — Z79899 Other long term (current) drug therapy: Secondary | ICD-10-CM | POA: Diagnosis present

## 2021-10-04 NOTE — Progress Notes (Signed)
Office Visit Note  Patient: Cynthia Snyder             Date of Birth: November 30, 1955           MRN: [id number]             PCP: Chesley Noon, MD Referring: Chesley Noon, MD Visit Date: 10/04/2021 Occupation: '@GUAROCC'$ @  Subjective:  Medication monitoring  History of Present Illness: Cynthia Snyder is a 66 y.o. female with history of psoriatic arthritis and DDD. She remains on Cimzia 400 mg sq injections every 28 days and Methotrexate 4 tablets by mouth once weekly.  She is tolerating combination therapy without any side effects or injection site reactions.  She denies any signs or symptoms of a psoriatic arthritis flare.  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  She is not having to sleep in a recliner at night which has been causing some increased discomfort in her lower back.  She denies any other joint pain or joint swelling at this time.  She denies any Achilles tendinitis or plantar fasciitis.  She denies any active psoriasis.  She states that she has had some increased fatigue which she attributes to interrupted sleep at night.  She denies any recent or recurrent infections.  She denies any new medical conditions. She received her annual flu shot.  Activities of Daily Living:  Patient reports morning stiffness for 30 minutes.   Patient Denies nocturnal pain.  Difficulty dressing/grooming: Denies Difficulty climbing stairs: Denies Difficulty getting out of chair: Denies Difficulty using hands for taps, buttons, cutlery, and/or writing: Denies  Review of Systems  Constitutional:  Positive for fatigue.  HENT:  Positive for mouth dryness. Negative for mouth sores and nose dryness.   Eyes:  Negative for pain, visual disturbance and dryness.  Respiratory:  Positive for shortness of breath. Negative for cough, hemoptysis and difficulty breathing.        With  exertion   Cardiovascular:  Negative for chest pain, palpitations, hypertension and swelling in legs/feet.  Gastrointestinal:  Negative for blood in stool, constipation and diarrhea.  Endocrine: Negative for increased urination.  Genitourinary:  Negative for painful urination.  Musculoskeletal:  Positive for joint pain, joint pain, joint swelling and morning stiffness. Negative for myalgias, muscle weakness, muscle tenderness and myalgias.  Skin:  Negative for color change, pallor, rash, hair loss, nodules/bumps, skin tightness, ulcers and sensitivity to sunlight.  Allergic/Immunologic: Negative for susceptible to infections.  Neurological:  Negative for dizziness, numbness, headaches and weakness.  Hematological:  Negative for swollen glands.  Psychiatric/Behavioral:  Positive for sleep disturbance. Negative for depressed mood. The patient is not nervous/anxious.     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   Pain of right hip joint 03/26/2017   Psoriatic arthropathy ([place]) 11/07/2016   Psoriasis 08/04/2016   DJD (degenerative joint disease), cervical 08/04/2016   Esophagitis 08/04/2016   Restless leg syndrome 08/04/2016   High risk medication use 08/04/2016   Atrophic vaginitis 01/22/2015    Class: Chronic    Past Medical History:  Diagnosis Date   Abnormal Pap smear    many yrs ago   Endometrial cancer ([place])    Endometriosis    Esophagus disorder    Psoriatic arthritis ([place])    Shingles    Shingles  Family History  Problem Relation Age of Onset   Heart disease Mother    Hypertension Mother    Stroke Mother    Diabetes Mother    Osteoporosis Mother    Hypothyroidism Mother    Heart disease Father    Heart disease Brother    Stroke Brother    Cancer Paternal Aunt        ovarian   Colon cancer Maternal Grandmother    Breast cancer Neg Hx    Past Surgical History:  Procedure  Laterality Date   ABDOMINAL HYSTERECTOMY     age 84   APPENDECTOMY     BILATERAL SALPINGOOPHORECTOMY Right 11/24/08   lysis of adhesion   CATARACT EXTRACTION, BILATERAL     esophageal stretched     EXPLORATORY LAPAROTOMY     jaw bone graft  2004   KNEE ARTHROPLASTY     KNEE SURGERY     LAPAROSCOPY     age 45   LIPOSUCTION TRUNK  04/06/2016   Dr. Lenon Curt for body image   LIVER BIOPSY     mild steatosis   NOSE SURGERY     Social History   Social History Narrative   Not on file   Immunization History  Administered Date(s) Administered   Influenza Inj Mdck Quad Pf 10/23/2017   Influenza Split 10/20/2017   Influenza, Seasonal, Injecte, Preservative Fre 10/29/2013, 12/08/2014, 11/18/2015   Moderna Sars-Covid-2 Vaccination 04/28/2019, 05/29/2019   Tdap 11/06/2012   Zoster, Live 10/01/2015     Objective: Vital Signs: BP 120/80 (BP Location: Left Arm, Patient Position: Sitting, Cuff Size: Normal)   Pulse 88   Resp 14   Ht '5\' 2"'$  (1.575 m)   Wt 175 lb (79.4 kg)   LMP 02/06/1982   BMI 32.01 kg/m    Physical Exam Vitals and nursing note reviewed.  Constitutional:      Appearance: She is well-developed.  HENT:     Head: Normocephalic and atraumatic.  Eyes:     Conjunctiva/sclera: Conjunctivae normal.  Cardiovascular:     Rate and Rhythm: Normal rate and regular rhythm.     Heart sounds: Normal heart sounds.  Pulmonary:     Effort: Pulmonary effort is normal.     Breath sounds: Normal breath sounds.  Abdominal:     General: Bowel sounds are normal.     Palpations: Abdomen is soft.  Musculoskeletal:     Cervical back: Normal range of motion.  Skin:    General: Skin is warm and dry.     Capillary Refill: Capillary refill takes less than 2 seconds.  Neurological:     Mental Status: She is alert and oriented to person, place, and time.  Psychiatric:        Behavior: Behavior normal.      Musculoskeletal Exam: C-spine, thoracic spine, lumbar spine have good range  of motion.  Some midline spinal tenderness and tenderness over the right SI joint.  Shoulder joints, elbow joints, wrist joints, MCPs, PIPs, DIPs have good range of motion with no synovitis.  Some PIP and DIP thickening consistent with osteoarthritis of both hands.  Complete fist formation bilaterally.  Hip joints have good range of motion with no groin pain.  Knee joints have good range of motion with no warmth or effusion.  Ankle joints have good range of motion with no tenderness or joint swelling.  No evidence of Achilles tendinitis or plantar fasciitis.  No tenderness over MTP joints.   CDAI Exam: CDAI Score: 0.4  Patient Global: 2 mm; Provider Global: 2 mm Swollen: 0 ; Tender: 0  Joint Exam 10/04/2021   No joint exam has been documented for this visit   There is currently no information documented on the homunculus. Go to the Rheumatology activity and complete the homunculus joint exam.  Investigation: No additional findings.  Imaging: No results found.  Recent Labs: Lab Results  Component Value Date   WBC 8.2 07/15/2021   HGB 14.4 07/15/2021   PLT 249 07/15/2021   NA 140 07/15/2021   K 4.3 07/15/2021   CL 105 07/15/2021   CO2 27 07/15/2021   GLUCOSE 86 07/15/2021   BUN 15 07/15/2021   CREATININE 0.83 07/15/2021   BILITOT 0.6 07/15/2021   ALKPHOS 82 08/07/2016   AST 17 07/15/2021   ALT 20 07/15/2021   PROT 6.9 07/15/2021   ALBUMIN 4.4 08/07/2016   CALCIUM 9.3 07/15/2021   GFRAA 85 07/26/2020   QFTBGOLDPLUS NEGATIVE 10/18/2020    Speciality Comments: Enbrel discontinued August 2021-not covered by Medicare Cimzia- started 12/22/2019  Procedures:  No procedures performed Allergies: Patient has no known allergies.   Assessment / Plan:     Visit Diagnoses: Psoriatic arthropathy ([place]): She has no synovitis or dactylitis on examination today.  No evidence of Achilles tendinitis or plantar fasciitis.  No active psoriasis at this time.  She has not had any signs or  symptoms of a psoriatic arthritis flare.  She has clinically been doing well on Cimzia 400 mg sq injections every 28 days and methotrexate 4 tablets by mouth once weekly.  She is tolerating combination therapy without any side effects or injection site reactions.  She has not had any recent or recurrent infections.  She is having some increased discomfort in her lower back which she attributes to having to sleep in a recliner since she has been acting as a caregiver for her husband.  On examination she has some midline spinal tenderness and tenderness over the right SI joint.  She is not experiencing any symptoms of radiculopathy at this time. She will remain on the current treatment regimen.  She was advised to notify us if she develops increased joint pain or joint swelling.  She will follow up in 5 months or sooner if needed.   High risk medication use - Cimzia 400 mg sq injections every 28 days and Methotrexate 4 tablets by mouth once weekly.  Taking folic acid 287 mcg daily.  D/c Enbrel-August 2021 - Plan: CBC with Differential/Platelet, COMPLETE METABOLIC PANEL WITH GFR, QuantiFERON-TB Gold Plus CBC and CMP WNL on 07/15/21.  Orders for CBC and CMP released today.  Her next lab work will be due in early December and every 3 months.  Standing orders for CBC and CMP released today.  TB gold negative 10/18/20.  Order for TB gold released today.  No recent or recurrent infections.  Discussed the importance of holding cimzia and methotrexate if she develops signs or symptoms of an infection and to resume once the infection has completely cleared.   Screening for tuberculosis - Order for TB gold released today. Plan: QuantiFERON-TB Gold Plus  Psoriasis: No active psoriasis at this time.   DDD (degenerative disc disease), cervical: C-spine has good ROM with no discomfort.  No symptoms of radiculopathy.   Osteopenia of multiple sites - DEXA updated on 05/25/20 and results were reviewed today in the office:  RFN BMD 0.645 with T-score -1.8. She is taking a calcium and vitamin D supplement daily.  Other medical conditions are listed as follows:   History of restless legs syndrome  History of esophagitis  Orders: Orders Placed This Encounter  Procedures   CBC with Differential/Platelet   COMPLETE METABOLIC PANEL WITH GFR   QuantiFERON-TB Gold Plus   No orders of the defined types were placed in this encounter.     Follow-Up Instructions: Return in 5 months (on 03/06/2022) for Psoriatic arthritis.   Ofilia Neas, PA-C  Note - This record has been created using Dragon software.  Chart creation errors have been sought, but may not always  have been located. Such creation errors do not reflect on  the standard of medical care.

## 2021-10-05 NOTE — Progress Notes (Signed)
Glucose is 106. Rest of CMP WNL.  CBC WNL

## 2021-10-07 LAB — CBC WITH DIFFERENTIAL/PLATELET
Absolute Monocytes: 491 cells/uL (ref 200–950)
Basophils Absolute: 62 cells/uL (ref 0–200)
Basophils Relative: 0.8 %
Eosinophils Absolute: 117 cells/uL (ref 15–500)
Eosinophils Relative: 1.5 %
HCT: 43 % (ref 35.0–45.0)
Hemoglobin: 14.5 g/dL (ref 11.7–15.5)
Lymphs Abs: 3385 cells/uL (ref 850–3900)
MCH: 29.8 pg (ref 27.0–33.0)
MCHC: 33.7 g/dL (ref 32.0–36.0)
MCV: 88.3 fL (ref 80.0–100.0)
MPV: 11.3 fL (ref 7.5–12.5)
Monocytes Relative: 6.3 %
Neutro Abs: 3744 cells/uL (ref 1500–7800)
Neutrophils Relative %: 48 %
Platelets: 207 10*3/uL (ref 140–400)
RBC: 4.87 10*6/uL (ref 3.80–5.10)
RDW: 13.6 % (ref 11.0–15.0)
Total Lymphocyte: 43.4 %
WBC: 7.8 10*3/uL (ref 3.8–10.8)

## 2021-10-07 LAB — COMPLETE METABOLIC PANEL WITH GFR
AG Ratio: 1.7 (calc) (ref 1.0–2.5)
ALT: 26 U/L (ref 6–29)
AST: 23 U/L (ref 10–35)
Albumin: 4.3 g/dL (ref 3.6–5.1)
Alkaline phosphatase (APISO): 70 U/L (ref 37–153)
BUN: 17 mg/dL (ref 7–25)
CO2: 24 mmol/L (ref 20–32)
Calcium: 9.5 mg/dL (ref 8.6–10.4)
Chloride: 108 mmol/L (ref 98–110)
Creat: 0.91 mg/dL (ref 0.50–1.05)
Globulin: 2.5 g/dL (calc) (ref 1.9–3.7)
Glucose, Bld: 106 mg/dL — ABNORMAL HIGH (ref 65–99)
Potassium: 3.9 mmol/L (ref 3.5–5.3)
Sodium: 141 mmol/L (ref 135–146)
Total Bilirubin: 0.5 mg/dL (ref 0.2–1.2)
Total Protein: 6.8 g/dL (ref 6.1–8.1)
eGFR: 70 mL/min/{1.73_m2} (ref >=60)

## 2021-10-07 LAB — QUANTIFERON-TB GOLD PLUS
Mitogen-NIL: >10 IU/mL
NIL: 0.04 IU/mL
QuantiFERON-TB Gold Plus: NEGATIVE
TB1-NIL: <0 IU/mL
TB2-NIL: 0.01 IU/mL

## 2021-10-11 NOTE — Progress Notes (Signed)
TB gold negative

## 2021-10-20 ENCOUNTER — Ambulatory Visit: Payer: Medicare Other | Attending: Rheumatology | Admitting: *Deleted

## 2021-10-20 VITALS — BP 130/89 | HR 98

## 2021-10-20 DIAGNOSIS — L4050 Arthropathic psoriasis, unspecified: Secondary | ICD-10-CM | POA: Diagnosis present

## 2021-10-20 MED ORDER — CERTOLIZUMAB PEGOL 2 X 200 MG ~~LOC~~ KIT
400.0000 mg | PACK | Freq: Once | SUBCUTANEOUS | Status: AC
  Administered 2021-10-20: 400 mg via SUBCUTANEOUS

## 2021-10-20 NOTE — Progress Notes (Signed)
Subjective:   Patient presents to clinic today to receive monthly dose of Cimzia.  Patient running a fever or have signs/symptoms of infection? No  Patient currently on antibiotics for the treatment of infection? No  Patient have any upcoming invasive procedures/surgeries? No  Objective: CMP     Component Value Date/Time   NA 141 10/04/2021 1359   K 3.9 10/04/2021 1359   CL 108 10/04/2021 1359   CO2 24 10/04/2021 1359   GLUCOSE 106 (H) 10/04/2021 1359   BUN 17 10/04/2021 1359   CREATININE 0.91 10/04/2021 1359   CALCIUM 9.5 10/04/2021 1359   PROT 6.8 10/04/2021 1359   ALBUMIN 4.4 08/07/2016 0911   AST 23 10/04/2021 1359   ALT 26 10/04/2021 1359   ALKPHOS 82 08/07/2016 0911   BILITOT 0.5 10/04/2021 1359   GFRNONAA 73 07/26/2020 1421   GFRAA 85 07/26/2020 1421    CBC    Component Value Date/Time   WBC 7.8 10/04/2021 1359   RBC 4.87 10/04/2021 1359   HGB 14.5 10/04/2021 1359   HGB 14.4 11/25/2013 0943   HCT 43.0 10/04/2021 1359   PLT 207 10/04/2021 1359   MCV 88.3 10/04/2021 1359   MCH 29.8 10/04/2021 1359   MCHC 33.7 10/04/2021 1359   RDW 13.6 10/04/2021 1359   LYMPHSABS 3,385 10/04/2021 1359   MONOABS 560 08/07/2016 0911   EOSABS 117 10/04/2021 1359   BASOSABS 62 10/04/2021 1359    Baseline Immunosuppressant Therapy Labs TB GOLD    Latest Ref Rng & Units 10/04/2021    1:59 PM  Quantiferon TB Gold  Quantiferon TB Gold Plus NEGATIVE NEGATIVE    Hepatitis Panel    Latest Ref Rng & Units 08/07/2016    9:11 AM  Hepatitis  Hep B Surface Ag NEGATIVE NEGATIVE   Hep B IgM NON REACTIVE NON REACTIVE   Hep C Ab NEGATIVE NEGATIVE    HIV Lab Results  Component Value Date   HIV NONREACTIVE 08/07/2016   Immunoglobulins    Latest Ref Rng & Units 08/07/2016    9:11 AM  Immunoglobulin Electrophoresis  IgG 694 - 1,618 mg/dL 1,114   IgM 48 - 271 mg/dL 151    SPEP    Latest Ref Rng & Units 10/04/2021    1:59 PM  Serum Protein Electrophoresis  Total Protein 6.1 -  8.1 g/dL 6.8    G6PD No results found for: "G6PDH" TPMT No results found for: "TPMT"   Chest x-ray: 02/03/2011 No active disease  Assessment/Plan:     Patient tolerated injection well.   Appointment for next injection scheduled for 11/17/2021.  Patient due for labs in November 2023.  Patient is to call and reschedule appointment if running a fever with signs/symptoms of infection, on antibiotics for active infection or has an upcoming invasive procedure.  All questions encouraged and answered.  Instructed patient to call with any further questions or concerns.

## 2021-11-17 ENCOUNTER — Ambulatory Visit: Payer: Medicare Other | Attending: Rheumatology | Admitting: *Deleted

## 2021-11-17 VITALS — BP 144/84 | HR 76

## 2021-11-17 DIAGNOSIS — L4050 Arthropathic psoriasis, unspecified: Secondary | ICD-10-CM | POA: Diagnosis present

## 2021-11-17 MED ORDER — CERTOLIZUMAB PEGOL 2 X 200 MG ~~LOC~~ KIT
400.0000 mg | PACK | Freq: Once | SUBCUTANEOUS | Status: AC
  Administered 2021-11-17: 400 mg via SUBCUTANEOUS

## 2021-11-17 NOTE — Progress Notes (Signed)
Subjective:   Patient presents to clinic today to receive monthly dose of Cimzia.  Patient running a fever or have signs/symptoms of infection? No  Patient currently on antibiotics for the treatment of infection? No  Patient have any upcoming invasive procedures/surgeries? No  Objective: CMP     Component Value Date/Time   NA 141 10/04/2021 1359   K 3.9 10/04/2021 1359   CL 108 10/04/2021 1359   CO2 24 10/04/2021 1359   GLUCOSE 106 (H) 10/04/2021 1359   BUN 17 10/04/2021 1359   CREATININE 0.91 10/04/2021 1359   CALCIUM 9.5 10/04/2021 1359   PROT 6.8 10/04/2021 1359   ALBUMIN 4.4 08/07/2016 0911   AST 23 10/04/2021 1359   ALT 26 10/04/2021 1359   ALKPHOS 82 08/07/2016 0911   BILITOT 0.5 10/04/2021 1359   GFRNONAA 73 07/26/2020 1421   GFRAA 85 07/26/2020 1421    CBC    Component Value Date/Time   WBC 7.8 10/04/2021 1359   RBC 4.87 10/04/2021 1359   HGB 14.5 10/04/2021 1359   HGB 14.4 11/25/2013 0943   HCT 43.0 10/04/2021 1359   PLT 207 10/04/2021 1359   MCV 88.3 10/04/2021 1359   MCH 29.8 10/04/2021 1359   MCHC 33.7 10/04/2021 1359   RDW 13.6 10/04/2021 1359   LYMPHSABS 3,385 10/04/2021 1359   MONOABS 560 08/07/2016 0911   EOSABS 117 10/04/2021 1359   BASOSABS 62 10/04/2021 1359    Baseline Immunosuppressant Therapy Labs TB GOLD    Latest Ref Rng & Units 10/04/2021    1:59 PM  Quantiferon TB Gold  Quantiferon TB Gold Plus NEGATIVE NEGATIVE    Hepatitis Panel    Latest Ref Rng & Units 08/07/2016    9:11 AM  Hepatitis  Hep B Surface Ag NEGATIVE NEGATIVE   Hep B IgM NON REACTIVE NON REACTIVE   Hep C Ab NEGATIVE NEGATIVE    HIV Lab Results  Component Value Date   HIV NONREACTIVE 08/07/2016   Immunoglobulins    Latest Ref Rng & Units 08/07/2016    9:11 AM  Immunoglobulin Electrophoresis  IgG 694 - 1,618 mg/dL 1,114   IgM 48 - 271 mg/dL 151    SPEP    Latest Ref Rng & Units 10/04/2021    1:59 PM  Serum Protein Electrophoresis  Total Protein 6.1 -  8.1 g/dL 6.8    G6PD No results found for: "G6PDH" TPMT No results found for: "TPMT"   Chest x-ray: 02/03/2011 No active disease  Assessment/Plan:   Administrations This Visit     certolizumab pegol (CIMZIA) kit 400 mg     Admin Date 11/17/2021 Action Given Dose 400 mg Route Subcutaneous Administered By Carole Binning, LPN             Patient tolerated injection well.   Appointment for next injection scheduled for 12/15/2021.  Patient due for labs in November 2023.  Patient is to call and reschedule appointment if running a fever with signs/symptoms of infection, on antibiotics for active infection or has an upcoming invasive procedure.  All questions encouraged and answered.  Instructed patient to call with any further questions or concerns.

## 2021-11-30 ENCOUNTER — Other Ambulatory Visit: Payer: Self-pay | Admitting: Physician Assistant

## 2021-11-30 NOTE — Telephone Encounter (Signed)
Next Visit: 02/22/2022  Last Visit: 10/04/2021  Last Fill: 01/11/2021  DX: Psoriatic arthropathy   Current Dose per office note 10/04/2021: Methotrexate 4 tablets by mouth once weekly  Labs: 10/04/2021 Glucose is 106. Rest of CMP WNL.  CBC WNL  Okay to refill MTX?

## 2021-12-15 ENCOUNTER — Ambulatory Visit: Payer: Medicare Other | Attending: Rheumatology | Admitting: *Deleted

## 2021-12-15 VITALS — BP 145/62 | HR 82

## 2021-12-15 DIAGNOSIS — L4050 Arthropathic psoriasis, unspecified: Secondary | ICD-10-CM | POA: Insufficient documentation

## 2021-12-15 MED ORDER — CERTOLIZUMAB PEGOL 2 X 200 MG ~~LOC~~ KIT
400.0000 mg | PACK | Freq: Once | SUBCUTANEOUS | Status: AC
  Administered 2021-12-15: 400 mg via SUBCUTANEOUS

## 2021-12-15 NOTE — Progress Notes (Signed)
Subjective:   Patient presents to clinic today to receive monthly dose of Cimzia.  Patient running a fever or have signs/symptoms of infection? No  Patient currently on antibiotics for the treatment of infection? No  Patient have any upcoming invasive procedures/surgeries? No  Objective: CMP     Component Value Date/Time   NA 141 10/04/2021 1359   K 3.9 10/04/2021 1359   CL 108 10/04/2021 1359   CO2 24 10/04/2021 1359   GLUCOSE 106 (H) 10/04/2021 1359   BUN 17 10/04/2021 1359   CREATININE 0.91 10/04/2021 1359   CALCIUM 9.5 10/04/2021 1359   PROT 6.8 10/04/2021 1359   ALBUMIN 4.4 08/07/2016 0911   AST 23 10/04/2021 1359   ALT 26 10/04/2021 1359   ALKPHOS 82 08/07/2016 0911   BILITOT 0.5 10/04/2021 1359   GFRNONAA 73 07/26/2020 1421   GFRAA 85 07/26/2020 1421    CBC    Component Value Date/Time   WBC 7.8 10/04/2021 1359   RBC 4.87 10/04/2021 1359   HGB 14.5 10/04/2021 1359   HGB 14.4 11/25/2013 0943   HCT 43.0 10/04/2021 1359   PLT 207 10/04/2021 1359   MCV 88.3 10/04/2021 1359   MCH 29.8 10/04/2021 1359   MCHC 33.7 10/04/2021 1359   RDW 13.6 10/04/2021 1359   LYMPHSABS 3,385 10/04/2021 1359   MONOABS 560 08/07/2016 0911   EOSABS 117 10/04/2021 1359   BASOSABS 62 10/04/2021 1359    Baseline Immunosuppressant Therapy Labs TB GOLD    Latest Ref Rng & Units 10/04/2021    1:59 PM  Quantiferon TB Gold  Quantiferon TB Gold Plus NEGATIVE NEGATIVE    Hepatitis Panel    Latest Ref Rng & Units 08/07/2016    9:11 AM  Hepatitis  Hep B Surface Ag NEGATIVE NEGATIVE   Hep B IgM NON REACTIVE NON REACTIVE   Hep C Ab NEGATIVE NEGATIVE    HIV Lab Results  Component Value Date   HIV NONREACTIVE 08/07/2016   Immunoglobulins    Latest Ref Rng & Units 08/07/2016    9:11 AM  Immunoglobulin Electrophoresis  IgG 694 - 1,618 mg/dL 1,114   IgM 48 - 271 mg/dL 151    SPEP    Latest Ref Rng & Units 10/04/2021    1:59 PM  Serum Protein Electrophoresis  Total Protein 6.1 -  8.1 g/dL 6.8    G6PD No results found for: "G6PDH" TPMT No results found for: "TPMT"   Chest x-ray: 02/03/2011 No active disease   Assessment/Plan:   Administrations This Visit     certolizumab pegol (CIMZIA) kit 400 mg     Admin Date 12/15/2021 Action Given Dose 400 mg Route Subcutaneous Administered By Carole Binning, LPN             Patient tolerated injection well.   Appointment for next injection scheduled for 01/12/2022.  Patient due for labs 01/04/2022. Patient will update with next Cimzia injection.  Patient is to call and reschedule appointment if running a fever with signs/symptoms of infection, on antibiotics for active infection or has an upcoming invasive procedure.  All questions encouraged and answered.  Instructed patient to call with any further questions or concerns.

## 2022-01-06 ENCOUNTER — Telehealth: Payer: Self-pay | Admitting: Pharmacist

## 2022-01-06 NOTE — Telephone Encounter (Signed)
Submitted Cimzia reverification of benefits in [employer] Portal.  Left VM for patient to confirm she does not anticipate insurance changes in 2024. Requested return call to confirm  Knox Saliva, PharmD, MPH, BCPS, CPP Clinical Pharmacist (Rheumatology and Pulmonology)

## 2022-01-10 NOTE — Telephone Encounter (Signed)
LBPU triage - please disregard. This is for a rheumatology patient  Returned call to patient today regarding Cimzia reverification of benefits but unable to reach. Left VM requesting return call  Knox Saliva, PharmD, MPH, BCPS, CPP Clinical Pharmacist (Rheumatology and Pulmonology)

## 2022-01-12 ENCOUNTER — Ambulatory Visit: Payer: Medicare Other

## 2022-01-18 NOTE — Telephone Encounter (Signed)
Spoke with patient regarding Cimzia. She states that she is changing rx drug plans. Advised her to call our clinic once she gets new information.  She plans to keep her [employer]  Knox Saliva, PharmD, MPH, BCPS, CPP Clinical Pharmacist (Rheumatology and Pulmonology)

## 2022-01-19 ENCOUNTER — Ambulatory Visit: Payer: Medicare Other | Attending: Rheumatology | Admitting: *Deleted

## 2022-01-19 DIAGNOSIS — L409 Psoriasis, unspecified: Secondary | ICD-10-CM | POA: Diagnosis present

## 2022-01-19 DIAGNOSIS — Z79899 Other long term (current) drug therapy: Secondary | ICD-10-CM | POA: Insufficient documentation

## 2022-01-19 DIAGNOSIS — L4050 Arthropathic psoriasis, unspecified: Secondary | ICD-10-CM | POA: Insufficient documentation

## 2022-01-19 MED ORDER — CERTOLIZUMAB PEGOL 2 X 200 MG ~~LOC~~ KIT
400.0000 mg | PACK | Freq: Once | SUBCUTANEOUS | Status: AC
  Administered 2022-01-19: 400 mg via SUBCUTANEOUS

## 2022-01-19 NOTE — Progress Notes (Signed)
Subjective:   Patient presents to clinic today to receive monthly dose of Cimzia.  Patient running a fever or have signs/symptoms of infection? No  Patient currently on antibiotics for the treatment of infection? No  Patient have any upcoming invasive procedures/surgeries? No  Objective: CMP     Component Value Date/Time   NA 141 10/04/2021 1359   K 3.9 10/04/2021 1359   CL 108 10/04/2021 1359   CO2 24 10/04/2021 1359   GLUCOSE 106 (H) 10/04/2021 1359   BUN 17 10/04/2021 1359   CREATININE 0.91 10/04/2021 1359   CALCIUM 9.5 10/04/2021 1359   PROT 6.8 10/04/2021 1359   ALBUMIN 4.4 08/07/2016 0911   AST 23 10/04/2021 1359   ALT 26 10/04/2021 1359   ALKPHOS 82 08/07/2016 0911   BILITOT 0.5 10/04/2021 1359   GFRNONAA 73 07/26/2020 1421   GFRAA 85 07/26/2020 1421    CBC    Component Value Date/Time   WBC 7.8 10/04/2021 1359   RBC 4.87 10/04/2021 1359   HGB 14.5 10/04/2021 1359   HGB 14.4 11/25/2013 0943   HCT 43.0 10/04/2021 1359   PLT 207 10/04/2021 1359   MCV 88.3 10/04/2021 1359   MCH 29.8 10/04/2021 1359   MCHC 33.7 10/04/2021 1359   RDW 13.6 10/04/2021 1359   LYMPHSABS 3,385 10/04/2021 1359   MONOABS 560 08/07/2016 0911   EOSABS 117 10/04/2021 1359   BASOSABS 62 10/04/2021 1359    Baseline Immunosuppressant Therapy Labs TB GOLD    Latest Ref Rng & Units 10/04/2021    1:59 PM  Quantiferon TB Gold  Quantiferon TB Gold Plus NEGATIVE NEGATIVE    Hepatitis Panel    Latest Ref Rng & Units 08/07/2016    9:11 AM  Hepatitis  Hep B Surface Ag NEGATIVE NEGATIVE   Hep B IgM NON REACTIVE NON REACTIVE   Hep C Ab NEGATIVE NEGATIVE    HIV Lab Results  Component Value Date   HIV NONREACTIVE 08/07/2016   Immunoglobulins    Latest Ref Rng & Units 08/07/2016    9:11 AM  Immunoglobulin Electrophoresis  IgG 694 - 1,618 mg/dL 1,114   IgM 48 - 271 mg/dL 151    SPEP    Latest Ref Rng & Units 10/04/2021    1:59 PM  Serum Protein Electrophoresis  Total Protein 6.1 -  8.1 g/dL 6.8    G6PD No results found for: "G6PDH" TPMT No results found for: "TPMT"   Chest x-ray: 02/03/2011 No active disease    Assessment/Plan:   Administrations This Visit     certolizumab pegol (CIMZIA) kit 400 mg     Admin Date 01/19/2022 Action Given Dose 400 mg Route Subcutaneous Administered By Carole Binning, LPN             Patient tolerated injection well.   Appointment for next injection scheduled for 02/16/2022.  Patient due for labs today and they were drawn while in office.  Patient is to call and reschedule appointment if running a fever with signs/symptoms of infection, on antibiotics for active infection or has an upcoming invasive procedure.  All questions encouraged and answered.  Instructed patient to call with any further questions or concerns.

## 2022-01-20 LAB — COMPLETE METABOLIC PANEL WITH GFR
AG Ratio: 1.7 (calc) (ref 1.0–2.5)
ALT: 24 U/L (ref 6–29)
AST: 19 U/L (ref 10–35)
Albumin: 4.3 g/dL (ref 3.6–5.1)
Alkaline phosphatase (APISO): 65 U/L (ref 37–153)
BUN: 17 mg/dL (ref 7–25)
CO2: 26 mmol/L (ref 20–32)
Calcium: 9.2 mg/dL (ref 8.6–10.4)
Chloride: 107 mmol/L (ref 98–110)
Creat: 0.8 mg/dL (ref 0.50–1.05)
Globulin: 2.6 g/dL (calc) (ref 1.9–3.7)
Glucose, Bld: 77 mg/dL (ref 65–99)
Potassium: 4.7 mmol/L (ref 3.5–5.3)
Sodium: 141 mmol/L (ref 135–146)
Total Bilirubin: 0.5 mg/dL (ref 0.2–1.2)
Total Protein: 6.9 g/dL (ref 6.1–8.1)
eGFR: 81 mL/min/{1.73_m2} (ref >=60)

## 2022-01-20 LAB — CBC WITH DIFFERENTIAL/PLATELET
Absolute Monocytes: 810 cells/uL (ref 200–950)
Basophils Absolute: 57 cells/uL (ref 0–200)
Basophils Relative: 0.7 %
Eosinophils Absolute: 170 cells/uL (ref 15–500)
Eosinophils Relative: 2.1 %
HCT: 40.7 % (ref 35.0–45.0)
Hemoglobin: 13.9 g/dL (ref 11.7–15.5)
Lymphs Abs: 2560 cells/uL (ref 850–3900)
MCH: 29.7 pg (ref 27.0–33.0)
MCHC: 34.2 g/dL (ref 32.0–36.0)
MCV: 87 fL (ref 80.0–100.0)
MPV: 11.3 fL (ref 7.5–12.5)
Monocytes Relative: 10 %
Neutro Abs: 4504 cells/uL (ref 1500–7800)
Neutrophils Relative %: 55.6 %
Platelets: 230 10*3/uL (ref 140–400)
RBC: 4.68 10*6/uL (ref 3.80–5.10)
RDW: 13.2 % (ref 11.0–15.0)
Total Lymphocyte: 31.6 %
WBC: 8.1 10*3/uL (ref 3.8–10.8)

## 2022-01-20 NOTE — Progress Notes (Signed)
CBC and CMP are normal.

## 2022-02-10 NOTE — Progress Notes (Signed)
Office Visit Note  Patient: Cynthia Snyder             Date of Birth: 12-29-1955           MRN: [id number]             PCP: Chesley Noon, MD Referring: Chesley Noon, MD Visit Date: 02/22/2022 Occupation: '@GUAROCC'$ @  Subjective:  Medication monitoring  History of Present Illness: Cynthia Snyder is a 67 y.o. female with history of psoriatic arthritis, psoriasis and osteoarthritis.  She denies having a flare of psoriasis or psoriatic arthritis since the last visit.  She has a small patch on her right shin which is unchanged.  She states she has been getting Cimzia injections on monthly basis.  She has been taking methotrexate 4 tablets weekly.  She denies having any interruption in her treatment.  She has been tolerating both medications without any side effects.  She has been under a lot of stress that she takes care of her parents and her husband.  She continues to have some stiffness in the cervical spine.    Activities of Daily Living:  Patient reports morning stiffness for 30 min to 1 hour.   Patient Reports nocturnal pain.  Difficulty dressing/grooming: Denies Difficulty climbing stairs: Reports Difficulty getting out of chair: Denies Difficulty using hands for taps, buttons, cutlery, and/or writing: Denies  Review of Systems  Constitutional:  Positive for fatigue.  HENT:  Positive for mouth dryness. Negative for mouth sores.   Eyes:  Negative for dryness.  Respiratory:  Negative for shortness of breath.   Cardiovascular:  Negative for chest pain and palpitations.  Gastrointestinal:  Negative for blood in stool, constipation and diarrhea.  Endocrine: Negative for increased urination.  Genitourinary:  Negative for difficulty urinating.  Musculoskeletal:  Positive for joint pain, joint pain, myalgias, morning stiffness and myalgias. Negative for gait problem, joint swelling, muscle weakness and muscle tenderness.  Skin:  Positive for rash and sensitivity  to sunlight. Negative for color change and hair loss.  Allergic/Immunologic: Negative for susceptible to infections.  Neurological:  Positive for headaches. Negative for dizziness.  Hematological:  Negative for swollen glands.  Psychiatric/Behavioral:  Positive for depressed mood and sleep disturbance. The patient is nervous/anxious.     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   Pain of right hip joint 03/26/2017   Psoriatic arthropathy ([place]) 11/07/2016   Psoriasis 08/04/2016   DJD (degenerative joint disease), cervical 08/04/2016   Esophagitis 08/04/2016   Restless leg syndrome 08/04/2016   High risk medication use 08/04/2016   Atrophic vaginitis 01/22/2015    Class: Chronic    Past Medical History:  Diagnosis Date   Abnormal Pap smear    many yrs ago   Endometrial cancer ([place])    Endometriosis    Esophagus disorder    Psoriatic arthritis ([place])    Shingles    Shingles     Family History  Problem Relation Age of Onset   Heart disease Mother    Hypertension Mother    Stroke Mother    Diabetes Mother    Osteoporosis Mother    Hypothyroidism Mother    Heart disease Father    COPD Father    Heart disease Brother    Stroke Brother    Colon cancer Maternal Grandmother    Cancer Paternal Aunt        ovarian  Breast cancer Neg Hx    Past Surgical History:  Procedure Laterality Date   ABDOMINAL HYSTERECTOMY     age 20   APPENDECTOMY     BILATERAL SALPINGOOPHORECTOMY Right 11/24/08   lysis of adhesion   CATARACT EXTRACTION, BILATERAL     esophageal stretched     EXPLORATORY LAPAROTOMY     jaw bone graft  2004   KNEE ARTHROPLASTY     KNEE SURGERY     LAPAROSCOPY     age 41   LIPOSUCTION TRUNK  04/06/2016   Dr. Lenon Curt for body image   LIVER BIOPSY     mild steatosis   NOSE SURGERY     Social History   Social History Narrative   Not on file   Immunization History   Administered Date(s) Administered   Influenza Inj Mdck Quad Pf 10/23/2017   Influenza Split 10/20/2017   Influenza, Seasonal, Injecte, Preservative Fre 10/29/2013, 12/08/2014, 11/18/2015   Moderna Sars-Covid-2 Vaccination 04/28/2019, 05/29/2019   Tdap 11/06/2012   Zoster, Live 10/01/2015     Objective: Vital Signs: BP 116/80 (BP Location: Left Arm, Patient Position: Sitting, Cuff Size: Small)   Pulse 94   Resp 12   Ht '5\' 2"'$  (1.575 m)   Wt 176 lb (79.8 kg)   LMP 02/06/1982   BMI 32.19 kg/m    Physical Exam Vitals and nursing note reviewed.  Constitutional:      Appearance: She is well-developed.  HENT:     Head: Normocephalic and atraumatic.  Eyes:     Conjunctiva/sclera: Conjunctivae normal.  Cardiovascular:     Rate and Rhythm: Normal rate and regular rhythm.     Heart sounds: Normal heart sounds.  Pulmonary:     Effort: Pulmonary effort is normal.     Breath sounds: Normal breath sounds.  Abdominal:     General: Bowel sounds are normal.     Palpations: Abdomen is soft.  Musculoskeletal:     Cervical back: Normal range of motion.  Lymphadenopathy:     Cervical: No cervical adenopathy.  Skin:    General: Skin is warm and dry.     Capillary Refill: Capillary refill takes less than 2 seconds.  Neurological:     Mental Status: She is alert and oriented to person, place, and time.  Psychiatric:        Behavior: Behavior normal.      Musculoskeletal Exam: Cervical spine was in good range of motion.  She had mild thoracic kyphosis.  She had no tenderness over lumbar spine.  She had mild tenderness over right SI joint.  Shoulder joints, elbow joints, wrist joints with good range of motion.  She had bilateral DIP and PIP thickening with incomplete fist formation bilaterally.  Hip joints and knee joints were in good range of motion without any warmth swelling or effusion.  There was no Achilles tendinitis of Planter fasciitis.  There was no tenderness over ankles or  MTPs.  CDAI Exam: CDAI Score: -- Patient Global: --; Provider Global: -- Swollen: --; Tender: -- Joint Exam 02/22/2022   No joint exam has been documented for this visit   There is currently no information documented on the homunculus. Go to the Rheumatology activity and complete the homunculus joint exam.  Investigation: No additional findings.  Imaging: No results found.  Recent Labs: Lab Results  Component Value Date   WBC 8.1 01/19/2022   HGB 13.9 01/19/2022   PLT 230 01/19/2022   NA 141 01/19/2022   K  4.7 01/19/2022   CL 107 01/19/2022   CO2 26 01/19/2022   GLUCOSE 77 01/19/2022   BUN 17 01/19/2022   CREATININE 0.80 01/19/2022   BILITOT 0.5 01/19/2022   ALKPHOS 82 08/07/2016   AST 19 01/19/2022   ALT 24 01/19/2022   PROT 6.9 01/19/2022   ALBUMIN 4.4 08/07/2016   CALCIUM 9.2 01/19/2022   GFRAA 85 07/26/2020   QFTBGOLDPLUS NEGATIVE 10/04/2021    Speciality Comments: Enbrel discontinued August 2021-not covered by Medicare Cimzia- started 12/22/2019  Procedures:  No procedures performed Allergies: Patient has no known allergies.   Assessment / Plan:     Visit Diagnoses: Psoriatic arthropathy (HCC)-she denies having flare of psoriatic arthritis since the last visit.  She denies any episodes of Achilles tendinitis, Planter fasciitis or uveitis.  She has been getting Cimzia injections 400 mg subcu every 28 days in the office.  She is also taking methotrexate 4 tablets weekly without any interruption.  She denies any side effects from the medications.  She had no synovitis on examination.  No dactylitis, Planter fasciitis or Achilles tendinitis was noted.  She continues to have some tenderness over the right SI joint.  She has stiffness in her hands and incomplete fist formation due to underlying osteoarthritis.  Patient will receive her Cimzia injections in the office today.  Prescription refill for methotrexate was given.  High risk medication use - Cimzia 400 mg sq  injections every 28 days and Methotrexate 4 tablets by mouth once weekly.  Taking folic acid 093 mcg daily.  D/c Enbrel-August 2021.  CBC and CMP on January 19, 2022 were normal.  TB Gold was negative on October 04, 2021.  She was advised.  Will repeat labs in March and then every 3 months to monitor for drug toxicity.  Information on immunization was placed in the AVS.  She was advised to hold Cimzia and methotrexate if she develops an infection and resume after infection resolves.  Annual skin examination to screen for skin cancer was advised.  Psoriasis-she had a small psoriasis patch on her right shin.  Patient does not want to use any topical agents at this point.  DDD (degenerative disc disease), cervical-she had good range of motion of the cervical spine without discomfort.  Osteopenia of multiple sites - DEXA updated on 05/25/20 and results were reviewed today in the office: RFN BMD 0.645 with T-score -1.8.  Use of calcium rich diet, vitamin D and exercise was emphasized.  Cynthia Snyder is been under a lot of stress that she takes care of her parents and her husband.  Relaxation technique and exercise was emphasized.  History of esophagitis  History of restless legs syndrome  Orders: No orders of the defined types were placed in this encounter.  Meds ordered this encounter  Medications   methotrexate (RHEUMATREX) 2.5 MG tablet    Sig: Take 4 tablets (10 mg total) by mouth once a week.    Dispense:  48 tablet    Refill:  0   certolizumab pegol (CIMZIA) kit 400 mg     Follow-Up Instructions: Return in about 5 months (around 07/24/2022) for Psoriatic arthritis.   Bo Merino, MD  Note - This record has been created using Editor, commissioning.  Chart creation errors have been sought, but may not always  have been located. Such creation errors do not reflect on  the standard of medical care.

## 2022-02-15 ENCOUNTER — Other Ambulatory Visit (HOSPITAL_COMMUNITY): Payer: Self-pay

## 2022-02-15 NOTE — Telephone Encounter (Signed)
LVM for patient regarding insurance information for 2024.

## 2022-02-22 ENCOUNTER — Encounter: Payer: Self-pay | Admitting: Rheumatology

## 2022-02-22 ENCOUNTER — Ambulatory Visit: Payer: Medicare Other | Attending: Rheumatology | Admitting: Rheumatology

## 2022-02-22 VITALS — BP 116/80 | HR 94 | Resp 12 | Ht 62.0 in | Wt 176.0 lb

## 2022-02-22 DIAGNOSIS — Z8719 Personal history of other diseases of the digestive system: Secondary | ICD-10-CM

## 2022-02-22 DIAGNOSIS — F439 Reaction to severe stress, unspecified: Secondary | ICD-10-CM | POA: Insufficient documentation

## 2022-02-22 DIAGNOSIS — L409 Psoriasis, unspecified: Secondary | ICD-10-CM | POA: Diagnosis not present

## 2022-02-22 DIAGNOSIS — M5030 Other cervical disc degeneration, unspecified cervical region: Secondary | ICD-10-CM

## 2022-02-22 DIAGNOSIS — Z8669 Personal history of other diseases of the nervous system and sense organs: Secondary | ICD-10-CM

## 2022-02-22 DIAGNOSIS — L4050 Arthropathic psoriasis, unspecified: Secondary | ICD-10-CM | POA: Diagnosis not present

## 2022-02-22 DIAGNOSIS — Z79899 Other long term (current) drug therapy: Secondary | ICD-10-CM | POA: Diagnosis not present

## 2022-02-22 DIAGNOSIS — M8589 Other specified disorders of bone density and structure, multiple sites: Secondary | ICD-10-CM | POA: Insufficient documentation

## 2022-02-22 MED ORDER — METHOTREXATE SODIUM 2.5 MG PO TABS: 10.0000 mg | ORAL_TABLET | ORAL | 0 refills | Status: DC

## 2022-02-22 MED ORDER — CERTOLIZUMAB PEGOL 2 X 200 MG ~~LOC~~ KIT
400.0000 mg | PACK | Freq: Once | SUBCUTANEOUS | Status: AC
  Administered 2022-02-22: 400 mg via SUBCUTANEOUS

## 2022-02-22 NOTE — Progress Notes (Signed)
Subjective:   Patient presents to clinic today to receive monthly dose of Cimzia.  Patient running a fever or have signs/symptoms of infection? No  Patient currently on antibiotics for the treatment of infection? No  Patient have any upcoming invasive procedures/surgeries? No  Objective: CMP     Component Value Date/Time   NA 141 01/19/2022 1403   K 4.7 01/19/2022 1403   CL 107 01/19/2022 1403   CO2 26 01/19/2022 1403   GLUCOSE 77 01/19/2022 1403   BUN 17 01/19/2022 1403   CREATININE 0.80 01/19/2022 1403   CALCIUM 9.2 01/19/2022 1403   PROT 6.9 01/19/2022 1403   ALBUMIN 4.4 08/07/2016 0911   AST 19 01/19/2022 1403   ALT 24 01/19/2022 1403   ALKPHOS 82 08/07/2016 0911   BILITOT 0.5 01/19/2022 1403   GFRNONAA 73 07/26/2020 1421   GFRAA 85 07/26/2020 1421    CBC    Component Value Date/Time   WBC 8.1 01/19/2022 1403   RBC 4.68 01/19/2022 1403   HGB 13.9 01/19/2022 1403   HGB 14.4 11/25/2013 0943   HCT 40.7 01/19/2022 1403   PLT 230 01/19/2022 1403   MCV 87.0 01/19/2022 1403   MCH 29.7 01/19/2022 1403   MCHC 34.2 01/19/2022 1403   RDW 13.2 01/19/2022 1403   LYMPHSABS 2,560 01/19/2022 1403   MONOABS 560 08/07/2016 0911   EOSABS 170 01/19/2022 1403   BASOSABS 57 01/19/2022 1403    Baseline Immunosuppressant Therapy Labs TB GOLD    Latest Ref Rng & Units 10/04/2021    1:59 PM  Quantiferon TB Gold  Quantiferon TB Gold Plus NEGATIVE NEGATIVE    Hepatitis Panel    Latest Ref Rng & Units 08/07/2016    9:11 AM  Hepatitis  Hep B Surface Ag NEGATIVE NEGATIVE   Hep B IgM NON REACTIVE NON REACTIVE   Hep C Ab NEGATIVE NEGATIVE    HIV Lab Results  Component Value Date   HIV NONREACTIVE 08/07/2016   Immunoglobulins    Latest Ref Rng & Units 08/07/2016    9:11 AM  Immunoglobulin Electrophoresis  IgG 694 - 1,618 mg/dL 1,114   IgM 48 - 271 mg/dL 151    SPEP    Latest Ref Rng & Units 01/19/2022    2:03 PM  Serum Protein Electrophoresis  Total Protein 6.1 - 8.1  g/dL 6.9    G6PD No results found for: "G6PDH" TPMT No results found for: "TPMT"   Chest x-ray: 02/03/2011 No active disease    Assessment/Plan:   Administrations This Visit     certolizumab pegol (CIMZIA) kit 400 mg     Admin Date 02/22/2022 Action Given Dose 400 mg Route Subcutaneous Administered By Carole Binning, LPN              Patient tolerated injection well.   Appointment for next injection scheduled for 03/22/2022.  Patient due for labs in March 2024.  Patient is to call and reschedule appointment if running a fever with signs/symptoms of infection, on antibiotics for active infection or has an upcoming invasive procedure.  All questions encouraged and answered.  Instructed patient to call with any further questions or concerns.

## 2022-02-22 NOTE — Patient Instructions (Signed)
Standing Labs We placed an order today for your standing lab work.   Please have your standing labs drawn in March and every 3 months  Please have your labs drawn 2 weeks prior to your appointment so that the provider can discuss your lab results at your appointment.  Please note that you may see your imaging and lab results in [place] before we have reviewed them. We will contact you once all results are reviewed. Please allow our office up to 72 hours to thoroughly review all of the results before contacting the office for clarification of your results.  Lab hours are:   Monday through Thursday from 8:00 am -12:30 pm and 1:00 pm-5:00 pm and Friday from 8:00 am-12:00 pm.  Please be advised, all patients with office appointments requiring lab work will take precedent over walk-in lab work.   Labs are drawn by [facility]. Please bring your co-pay at the time of your lab draw.  You may receive a bill from [facility] for your lab work.  Please note if you are on Hydroxychloroquine and and an order has been placed for a Hydroxychloroquine level, you will need to have it drawn 4 hours or more after your last dose.  If you wish to have your labs drawn at another location, please call the office 24 hours in advance so we can fax the orders.  The office is located at [address] [place], [place], [place] [address] No appointment is necessary.    If you have any questions regarding directions or hours of operation,  please call [phone].   As a reminder, please drink plenty of water prior to coming for your lab work. Thanks!   Vaccines You are taking a medication(s) that can suppress your immune system.  The following immunizations are recommended: Flu annually Covid-19  Td/Tdap (tetanus, diphtheria, pertussis) every 10 years Pneumonia (Prevnar 15 then Pneumovax 23 at least 1 year apart.  Alternatively, can take Prevnar 20 without needing additional dose) Shingrix: 2 doses from 4 weeks to  6 months apart  Please check with your PCP to make sure you are up to date.   If you have signs or symptoms of an infection or start antibiotics: First, call your PCP for workup of your infection. Hold your medication through the infection, until you complete your antibiotics, and until symptoms resolve if you take the following: Injectable medication (Actemra, Benlysta, Cimzia, Cosentyx, Enbrel, Humira, Kevzara, Orencia, Remicade, Simponi, Stelara, Taltz, Tremfya) Methotrexate Leflunomide (Arava) Mycophenolate (Cellcept) Morrie Sheldon, Olumiant, or Rinvoq  Please get an annual skin examination to screen for skin cancer while you are on Cimzia.

## 2022-02-27 NOTE — Telephone Encounter (Signed)
Received fax from [facility] for Cimzia in-office injection verification of benefits. Patient has  Medicare + [employer]. Both are active plans. The supplemental plan follows Medicare guidelines.   [facility] is in network. Patient has $240 Medicare A/B deductible. Once deductible is met, patient's supplemental plan will pick up 20% coinsurance that is associated with Cimzia in-office injection. 80% is covered by Medicare A/B. Pre-certification is not required for Cimzia J0717. CPT code [phone] is valid and billable with 20% coinsurance.   Pdf sent to media tab of patient's chart.   Knox Saliva, PharmD, MPH, BCPS, CPP Clinical Pharmacist (Rheumatology and Pulmonology)

## 2022-03-22 ENCOUNTER — Ambulatory Visit: Payer: Medicare Other

## 2022-03-29 ENCOUNTER — Telehealth: Payer: Self-pay | Admitting: *Deleted

## 2022-03-29 NOTE — Telephone Encounter (Signed)
Attempted to contact the patient and left message for patient to call the office to reschedule Cimzia injection.

## 2022-04-05 ENCOUNTER — Ambulatory Visit: Payer: Medicare Other | Attending: Rheumatology | Admitting: *Deleted

## 2022-04-05 VITALS — BP 126/83 | HR 86

## 2022-04-05 DIAGNOSIS — L4050 Arthropathic psoriasis, unspecified: Secondary | ICD-10-CM | POA: Insufficient documentation

## 2022-04-05 MED ORDER — CERTOLIZUMAB PEGOL 2 X 200 MG ~~LOC~~ KIT
400.0000 mg | PACK | Freq: Once | SUBCUTANEOUS | Status: AC
  Administered 2022-04-05: 400 mg via SUBCUTANEOUS

## 2022-04-05 NOTE — Progress Notes (Signed)
Subjective:   Patient presents to clinic today to receive monthly dose of Cimzia.  Patient running a fever or have signs/symptoms of infection? No  Patient currently on antibiotics for the treatment of infection? No  Patient have any upcoming invasive procedures/surgeries? No  Objective: CMP     Component Value Date/Time   NA 141 01/19/2022 1403   K 4.7 01/19/2022 1403   CL 107 01/19/2022 1403   CO2 26 01/19/2022 1403   GLUCOSE 77 01/19/2022 1403   BUN 17 01/19/2022 1403   CREATININE 0.80 01/19/2022 1403   CALCIUM 9.2 01/19/2022 1403   PROT 6.9 01/19/2022 1403   ALBUMIN 4.4 08/07/2016 0911   AST 19 01/19/2022 1403   ALT 24 01/19/2022 1403   ALKPHOS 82 08/07/2016 0911   BILITOT 0.5 01/19/2022 1403   GFRNONAA 73 07/26/2020 1421   GFRAA 85 07/26/2020 1421    CBC    Component Value Date/Time   WBC 8.1 01/19/2022 1403   RBC 4.68 01/19/2022 1403   HGB 13.9 01/19/2022 1403   HGB 14.4 11/25/2013 0943   HCT 40.7 01/19/2022 1403   PLT 230 01/19/2022 1403   MCV 87.0 01/19/2022 1403   MCH 29.7 01/19/2022 1403   MCHC 34.2 01/19/2022 1403   RDW 13.2 01/19/2022 1403   LYMPHSABS 2,560 01/19/2022 1403   MONOABS 560 08/07/2016 0911   EOSABS 170 01/19/2022 1403   BASOSABS 57 01/19/2022 1403    Baseline Immunosuppressant Therapy Labs TB GOLD    Latest Ref Rng & Units 10/04/2021    1:59 PM  Quantiferon TB Gold  Quantiferon TB Gold Plus NEGATIVE NEGATIVE    Hepatitis Panel    Latest Ref Rng & Units 08/07/2016    9:11 AM  Hepatitis  Hep B Surface Ag NEGATIVE NEGATIVE   Hep B IgM NON REACTIVE NON REACTIVE   Hep C Ab NEGATIVE NEGATIVE    HIV Lab Results  Component Value Date   HIV NONREACTIVE 08/07/2016   Immunoglobulins    Latest Ref Rng & Units 08/07/2016    9:11 AM  Immunoglobulin Electrophoresis  IgG 694 - 1,618 mg/dL 1,114   IgM 48 - 271 mg/dL 151    SPEP    Latest Ref Rng & Units 01/19/2022    2:03 PM  Serum Protein Electrophoresis  Total Protein 6.1 - 8.1  g/dL 6.9    G6PD No results found for: "G6PDH" TPMT No results found for: "TPMT"   Chest x-ray: 02/03/2011 No active disease   Assessment/Plan:   Administrations This Visit     certolizumab pegol (CIMZIA) kit 400 mg     Admin Date 04/05/2022 Action Given Dose 400 mg Route Subcutaneous Administered By Carole Binning, LPN             Patient tolerated injection well.   Appointment for next injection scheduled for 05/03/2022.  Patient due for labs in March 2024.  Patient is to call and reschedule appointment if running a fever with signs/symptoms of infection, on antibiotics for active infection or has an upcoming invasive procedure.  All questions encouraged and answered.  Instructed patient to call with any further questions or concerns.

## 2022-05-03 ENCOUNTER — Ambulatory Visit: Payer: Medicare Other

## 2022-05-04 ENCOUNTER — Ambulatory Visit: Payer: Medicare Other | Attending: Rheumatology | Admitting: *Deleted

## 2022-05-04 DIAGNOSIS — Z79899 Other long term (current) drug therapy: Secondary | ICD-10-CM | POA: Diagnosis present

## 2022-05-04 DIAGNOSIS — L409 Psoriasis, unspecified: Secondary | ICD-10-CM | POA: Diagnosis present

## 2022-05-04 DIAGNOSIS — L4050 Arthropathic psoriasis, unspecified: Secondary | ICD-10-CM | POA: Insufficient documentation

## 2022-05-04 MED ORDER — CERTOLIZUMAB PEGOL 2 X 200 MG ~~LOC~~ KIT
400.0000 mg | PACK | Freq: Once | SUBCUTANEOUS | Status: AC
  Administered 2022-05-04: 400 mg via SUBCUTANEOUS

## 2022-05-04 NOTE — Progress Notes (Signed)
Pharmacy Note  Subjective:   Patient presents to clinic today to receive monthly dose of Cimzia.  Patient running a fever or have signs/symptoms of infection? No  Patient currently on antibiotics for the treatment of infection? No  Patient have any upcoming invasive procedures/surgeries? No  Objective: CMP     Component Value Date/Time   NA 141 01/19/2022 1403   K 4.7 01/19/2022 1403   CL 107 01/19/2022 1403   CO2 26 01/19/2022 1403   GLUCOSE 77 01/19/2022 1403   BUN 17 01/19/2022 1403   CREATININE 0.80 01/19/2022 1403   CALCIUM 9.2 01/19/2022 1403   PROT 6.9 01/19/2022 1403   ALBUMIN 4.4 08/07/2016 0911   AST 19 01/19/2022 1403   ALT 24 01/19/2022 1403   ALKPHOS 82 08/07/2016 0911   BILITOT 0.5 01/19/2022 1403   GFRNONAA 73 07/26/2020 1421   GFRAA 85 07/26/2020 1421    CBC    Component Value Date/Time   WBC 8.1 01/19/2022 1403   RBC 4.68 01/19/2022 1403   HGB 13.9 01/19/2022 1403   HGB 14.4 11/25/2013 0943   HCT 40.7 01/19/2022 1403   PLT 230 01/19/2022 1403   MCV 87.0 01/19/2022 1403   MCH 29.7 01/19/2022 1403   MCHC 34.2 01/19/2022 1403   RDW 13.2 01/19/2022 1403   LYMPHSABS 2,560 01/19/2022 1403   MONOABS 560 08/07/2016 0911   EOSABS 170 01/19/2022 1403   BASOSABS 57 01/19/2022 1403    Baseline Immunosuppressant Therapy Labs TB GOLD    Latest Ref Rng & Units 10/04/2021    1:59 PM  Quantiferon TB Gold  Quantiferon TB Gold Plus NEGATIVE NEGATIVE    Hepatitis Panel    Latest Ref Rng & Units 08/07/2016    9:11 AM  Hepatitis  Hep B Surface Ag NEGATIVE NEGATIVE   Hep B IgM NON REACTIVE NON REACTIVE   Hep C Ab NEGATIVE NEGATIVE    HIV Lab Results  Component Value Date   HIV NONREACTIVE 08/07/2016   Immunoglobulins    Latest Ref Rng & Units 08/07/2016    9:11 AM  Immunoglobulin Electrophoresis  IgG 694 - 1,618 mg/dL 1,114   IgM 48 - 271 mg/dL 151    SPEP    Latest Ref Rng & Units 01/19/2022    2:03 PM  Serum Protein Electrophoresis  Total  Protein 6.1 - 8.1 g/dL 6.9    G6PD No results found for: "G6PDH" TPMT No results found for: "TPMT"   Chest x-ray: 02/03/2011 No active disease   Assessment/Plan:   Administrations This Visit     certolizumab pegol (CIMZIA) kit 400 mg     Admin Date 05/04/2022 Action Given Dose 400 mg Route Subcutaneous Administered By Carole Binning, LPN             Patient tolerated injection well.   Appointment for next injection scheduled for 06/01/2022.  Patient due for labs today and had them drawn in the office.  Patient is to call and reschedule appointment if running a fever with signs/symptoms of infection, on antibiotics for active infection or has an upcoming invasive procedure.  All questions encouraged and answered.  Instructed patient to call with any further questions or concerns.

## 2022-05-05 LAB — CBC WITH DIFFERENTIAL/PLATELET
Absolute Monocytes: 697 cells/uL (ref 200–950)
Basophils Absolute: 52 cells/uL (ref 0–200)
Basophils Relative: 0.6 %
Eosinophils Absolute: 146 cells/uL (ref 15–500)
Eosinophils Relative: 1.7 %
HCT: 43.2 % (ref 35.0–45.0)
Hemoglobin: 14.5 g/dL (ref 11.7–15.5)
Lymphs Abs: 2520 cells/uL (ref 850–3900)
MCH: 28.8 pg (ref 27.0–33.0)
MCHC: 33.6 g/dL (ref 32.0–36.0)
MCV: 85.9 fL (ref 80.0–100.0)
MPV: 11.5 fL (ref 7.5–12.5)
Monocytes Relative: 8.1 %
Neutro Abs: 5186 cells/uL (ref 1500–7800)
Neutrophils Relative %: 60.3 %
Platelets: 223 10*3/uL (ref 140–400)
RBC: 5.03 10*6/uL (ref 3.80–5.10)
RDW: 13.4 % (ref 11.0–15.0)
Total Lymphocyte: 29.3 %
WBC: 8.6 10*3/uL (ref 3.8–10.8)

## 2022-05-05 LAB — COMPLETE METABOLIC PANEL WITH GFR
AG Ratio: 1.6 (calc) (ref 1.0–2.5)
ALT: 25 U/L (ref 6–29)
AST: 19 U/L (ref 10–35)
Albumin: 4.4 g/dL (ref 3.6–5.1)
Alkaline phosphatase (APISO): 69 U/L (ref 37–153)
BUN: 17 mg/dL (ref 7–25)
CO2: 26 mmol/L (ref 20–32)
Calcium: 9.5 mg/dL (ref 8.6–10.4)
Chloride: 106 mmol/L (ref 98–110)
Creat: 0.78 mg/dL (ref 0.50–1.05)
Globulin: 2.7 g/dL (calc) (ref 1.9–3.7)
Glucose, Bld: 86 mg/dL (ref 65–99)
Potassium: 4 mmol/L (ref 3.5–5.3)
Sodium: 141 mmol/L (ref 135–146)
Total Bilirubin: 0.4 mg/dL (ref 0.2–1.2)
Total Protein: 7.1 g/dL (ref 6.1–8.1)
eGFR: 83 mL/min/{1.73_m2} (ref >=60)

## 2022-05-05 NOTE — Progress Notes (Signed)
CBC and CMP are normal.

## 2022-06-01 ENCOUNTER — Ambulatory Visit: Payer: Medicare Other | Attending: Rheumatology | Admitting: *Deleted

## 2022-06-01 ENCOUNTER — Other Ambulatory Visit: Payer: Self-pay | Admitting: Rheumatology

## 2022-06-01 VITALS — BP 153/87 | HR 89

## 2022-06-01 DIAGNOSIS — L4050 Arthropathic psoriasis, unspecified: Secondary | ICD-10-CM

## 2022-06-01 MED ORDER — CERTOLIZUMAB PEGOL 2 X 200 MG ~~LOC~~ KIT
400.0000 mg | PACK | Freq: Once | SUBCUTANEOUS | Status: AC
  Administered 2022-06-01: 400 mg via SUBCUTANEOUS

## 2022-06-01 NOTE — Telephone Encounter (Signed)
Last Fill: 02/22/2022  Labs: 05/04/2022 CBC and CMP are normal.   Next Visit: 07/24/2022  Last Visit: 02/22/2022  DX: Psoriatic arthropathy   Current Dose per office note 02/22/2022: Methotrexate 4 tablets by mouth once weekly   Okay to refill Methotrexate?

## 2022-06-01 NOTE — Progress Notes (Signed)
Subjective:   Patient presents to clinic today to receive monthly dose of Cimzia.  Patient running a fever or have signs/symptoms of infection? No  Patient currently on antibiotics for the treatment of infection? No  Patient have any upcoming invasive procedures/surgeries? No  Objective: CMP     Component Value Date/Time   NA 141 05/04/2022 1412   K 4.0 05/04/2022 1412   CL 106 05/04/2022 1412   CO2 26 05/04/2022 1412   GLUCOSE 86 05/04/2022 1412   BUN 17 05/04/2022 1412   CREATININE 0.78 05/04/2022 1412   CALCIUM 9.5 05/04/2022 1412   PROT 7.1 05/04/2022 1412   ALBUMIN 4.4 08/07/2016 0911   AST 19 05/04/2022 1412   ALT 25 05/04/2022 1412   ALKPHOS 82 08/07/2016 0911   BILITOT 0.4 05/04/2022 1412   GFRNONAA 73 07/26/2020 1421   GFRAA 85 07/26/2020 1421    CBC    Component Value Date/Time   WBC 8.6 05/04/2022 1412   RBC 5.03 05/04/2022 1412   HGB 14.5 05/04/2022 1412   HGB 14.4 11/25/2013 0943   HCT 43.2 05/04/2022 1412   PLT 223 05/04/2022 1412   MCV 85.9 05/04/2022 1412   MCH 28.8 05/04/2022 1412   MCHC 33.6 05/04/2022 1412   RDW 13.4 05/04/2022 1412   LYMPHSABS 2,520 05/04/2022 1412   MONOABS 560 08/07/2016 0911   EOSABS 146 05/04/2022 1412   BASOSABS 52 05/04/2022 1412    Baseline Immunosuppressant Therapy Labs TB GOLD    Latest Ref Rng & Units 10/04/2021    1:59 PM  Quantiferon TB Gold  Quantiferon TB Gold Plus NEGATIVE NEGATIVE    Hepatitis Panel    Latest Ref Rng & Units 08/07/2016    9:11 AM  Hepatitis  Hep B Surface Ag NEGATIVE NEGATIVE   Hep B IgM NON REACTIVE NON REACTIVE   Hep C Ab NEGATIVE NEGATIVE    HIV Lab Results  Component Value Date   HIV NONREACTIVE 08/07/2016   Immunoglobulins    Latest Ref Rng & Units 08/07/2016    9:11 AM  Immunoglobulin Electrophoresis  IgG 694 - 1,618 mg/dL 9,562   IgM 48 - 130 mg/dL 865    SPEP    Latest Ref Rng & Units 05/04/2022    2:12 PM  Serum Protein Electrophoresis  Total Protein 6.1 - 8.1  g/dL 7.1    H8IO No results found for: "G6PDH" TPMT No results found for: "TPMT"   Chest x-ray:  02/03/2011 No active disease   Assessment/Plan:   Administrations This Visit     certolizumab pegol (CIMZIA) kit 400 mg     Admin Date 06/01/2022 Action Given Dose 400 mg Route Subcutaneous Administered By Henriette Combs, LPN             Patient tolerated injection well.   Appointment for next injection scheduled for 06/29/2022.  Patient due for labs in June 2024.  Patient is to call and reschedule appointment if running a fever with signs/symptoms of infection, on antibiotics for active infection or has an upcoming invasive procedure.  All questions encouraged and answered.  Instructed patient to call with any further questions or concerns.

## 2022-06-29 ENCOUNTER — Ambulatory Visit: Payer: Medicare Other

## 2022-07-07 ENCOUNTER — Telehealth: Payer: Self-pay | Admitting: *Deleted

## 2022-07-07 NOTE — Telephone Encounter (Signed)
Attempted to contact the patient to reschedule her Cimzia injection. Left message for patient to call the office to reschedule.

## 2022-07-10 ENCOUNTER — Telehealth: Payer: Self-pay | Admitting: Rheumatology

## 2022-07-10 NOTE — Telephone Encounter (Signed)
Attempted to contact the patient and left message for patient to call the office. Offered patient an appointment for 07/13/2022 at 2:00 pm for her Cimzia injection.

## 2022-07-10 NOTE — Telephone Encounter (Signed)
Patient called stating she was returning Cynthia Snyder's call regarding her Cimzia injection.

## 2022-07-10 NOTE — Telephone Encounter (Signed)
Patient called back stating she just listened to her message and requested a return call to confirm Thursday at 2:00 pm.

## 2022-07-10 NOTE — Progress Notes (Signed)
Office Visit Note  Patient: Cynthia Snyder             Date of Birth: 03-17-1955           MRN: [id number]             PCP: Lucila Maine Referring: Eartha Inch, MD Visit Date: 07/24/2022 Occupation: @GUAROCC @  Subjective:  Medication monitoring   History of Present Illness: Cynthia Snyder is a 67 y.o. female with history of psoriatic arthritis.  Patient remains on Cimzia 400 mg sq injections every 28 days and Methotrexate 4 tablets by mouth once weekly.  She is tolerating combination therapy without any side effects.  Her last dose of Cimzia was administered on 07/13/2022.  She continues to tolerate Cimzia without any side effects or injection site reactions.  She has not had any recent or recurrent infections.  She denies any signs or symptoms of a psoriatic arthritis flare.  She has no active psoriasis at this time.  She denies any active Achilles tendinitis or plantar fasciitis at this time.  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  She was previously going to the chiropractor but has not had time recently.  She denies any joint swelling currently.  Patient states that she will be having a cholecystectomy later this summer and will be holding Cimzia and methotrexate as discussed.  Activities of Daily Living:  Patient reports morning stiffness for 30 minutes.   Patient Reports nocturnal pain.  Difficulty dressing/grooming: Denies Difficulty climbing stairs: Reports Difficulty getting out of chair: Denies Difficulty using hands for taps, buttons, cutlery, and/or writing: Denies  Review of Systems  Constitutional:  Positive for fatigue.  HENT:  Positive for mouth dryness. Negative for mouth sores and nose dryness.   Eyes:  Negative for pain, visual disturbance and dryness.  Respiratory:  Negative for cough, hemoptysis, shortness of breath and difficulty breathing.    Cardiovascular:  Negative for chest pain, palpitations, hypertension and swelling in legs/feet.  Gastrointestinal:  Positive for nausea. Negative for blood in stool, constipation and diarrhea.  Endocrine: Negative for increased urination.  Genitourinary:  Negative for painful urination and involuntary urination.  Musculoskeletal:  Positive for joint pain, joint pain, myalgias, morning stiffness and myalgias. Negative for gait problem, joint swelling, muscle weakness and muscle tenderness.  Skin:  Negative for color change, pallor, rash, hair loss, nodules/bumps, skin tightness, ulcers and sensitivity to sunlight.  Allergic/Immunologic: Negative for susceptible to infections.  Neurological:  Positive for headaches. Negative for dizziness, numbness and weakness.  Hematological:  Negative for swollen glands.  Psychiatric/Behavioral:  Positive for sleep disturbance. Negative for depressed mood. The patient is not nervous/anxious.     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   Pain of right hip joint 03/26/2017   Psoriatic arthropathy (HCC) 11/07/2016   Psoriasis 08/04/2016   DJD (degenerative joint disease), cervical 08/04/2016   Esophagitis 08/04/2016   Restless leg syndrome 08/04/2016   High risk medication use 08/04/2016   Atrophic vaginitis 01/22/2015    Class: Chronic    Past Medical History:  Diagnosis Date   Abnormal Pap smear    many yrs ago   Endometrial cancer (HCC)    Endometriosis    Esophagus disorder    Psoriatic arthritis (HCC)    Shingles    Shingles  Family History  Problem Relation Age of Onset   Heart disease Mother    Hypertension Mother    Stroke Mother    Diabetes Mother    Osteoporosis Mother    Hypothyroidism Mother    Heart disease Father    COPD Father    Heart disease Brother    Stroke Brother    Colon cancer Maternal Grandmother    Cancer Paternal Aunt         ovarian   Breast cancer Neg Hx    Past Surgical History:  Procedure Laterality Date   ABDOMINAL HYSTERECTOMY     age 55   APPENDECTOMY     BILATERAL SALPINGOOPHORECTOMY Right 11/24/08   lysis of adhesion   CATARACT EXTRACTION, BILATERAL     esophageal stretched     EXPLORATORY LAPAROTOMY     jaw bone graft  2004   KNEE ARTHROPLASTY     KNEE SURGERY     LAPAROSCOPY     age 7   LIPOSUCTION TRUNK  04/06/2016   Dr. Izora Ribas for body image   LIVER BIOPSY     mild steatosis   NOSE SURGERY     Social History   Social History Narrative   Not on file   Immunization History  Administered Date(s) Administered   Influenza Inj Mdck Quad Pf 10/23/2017   Influenza Split 10/20/2017   Influenza, Seasonal, Injecte, Preservative Fre 10/29/2013, 12/08/2014, 11/18/2015   Moderna Sars-Covid-2 Vaccination 04/28/2019, 05/29/2019   Tdap 11/06/2012   Zoster, Live 10/01/2015     Objective: Vital Signs: BP (!) 133/95 (BP Location: Left Arm, Patient Position: Sitting, Cuff Size: Normal)   Pulse 91   Resp 14   Ht 5\' 2"  (1.575 m)   Wt 181 lb 6.4 oz (82.3 kg)   LMP 02/06/1982   BMI 33.18 kg/m    Physical Exam Vitals and nursing note reviewed.  Constitutional:      Appearance: She is well-developed.  HENT:     Head: Normocephalic and atraumatic.  Eyes:     Conjunctiva/sclera: Conjunctivae normal.  Cardiovascular:     Rate and Rhythm: Normal rate and regular rhythm.     Heart sounds: Normal heart sounds.  Pulmonary:     Effort: Pulmonary effort is normal.     Breath sounds: Normal breath sounds.  Abdominal:     General: Bowel sounds are normal.     Palpations: Abdomen is soft.  Musculoskeletal:     Cervical back: Normal range of motion.  Lymphadenopathy:     Cervical: No cervical adenopathy.  Skin:    General: Skin is warm and dry.     Capillary Refill: Capillary refill takes less than 2 seconds.  Neurological:     Mental Status: She is alert and oriented to person, place, and  time.  Psychiatric:        Behavior: Behavior normal.      Musculoskeletal Exam: C-spine, thoracic spine, and lumbar spine have good range of motion.  Shoulder joints, elbow joints, wrist joints, MCPs, PIPs, DIPs have good range of motion with no synovitis.  Complete fist formation bilaterally.  PIP and DIP thickening noted.  Hip joints have good range of motion with no groin pain.  Knee joints have good range of motion with no warmth or effusion.  Ankle joints have good range of motion with no tenderness or joint swelling.  No evidence of Achilles tendinitis or plantar fasciitis.  No tenderness or synovitis over MTP joints.  CDAI Exam: CDAI  Score: -- Patient Global: --; Provider Global: -- Swollen: --; Tender: -- Joint Exam 07/24/2022   No joint exam has been documented for this visit   There is currently no information documented on the homunculus. Go to the Rheumatology activity and complete the homunculus joint exam.  Investigation: No additional findings.  Imaging: No results found.  Recent Labs: Lab Results  Component Value Date   WBC 8.6 05/04/2022   HGB 14.5 05/04/2022   PLT 223 05/04/2022   NA 141 05/04/2022   K 4.0 05/04/2022   CL 106 05/04/2022   CO2 26 05/04/2022   GLUCOSE 86 05/04/2022   BUN 17 05/04/2022   CREATININE 0.78 05/04/2022   BILITOT 0.4 05/04/2022   ALKPHOS 82 08/07/2016   AST 19 05/04/2022   ALT 25 05/04/2022   PROT 7.1 05/04/2022   ALBUMIN 4.4 08/07/2016   CALCIUM 9.5 05/04/2022   GFRAA 85 07/26/2020   QFTBGOLDPLUS NEGATIVE 10/04/2021    Speciality Comments: Enbrel discontinued August 2021-not covered by Medicare Cimzia- started 12/22/2019  Procedures:  No procedures performed Allergies: Patient has no known allergies.   Assessment / Plan:     Visit Diagnoses: Psoriatic arthropathy (HCC): She has no synovitis or dactylitis on examination today.  She has not had any signs or symptoms of a psoriatic arthritis flare.  She has clinically  been doing well on Cimzia 400 mg subcutaneous injections every 28 days along with methotrexate 4 tablets by mouth once weekly.  She has been tolerating combination therapy without any side effects or injection site reactions from Cimzia.  She has not had any recent or recurrent infections.  No missed doses.  Her last dose of Cimzia was administered on 07/13/2022.  She has no active psoriasis at this time.  No SI joint tenderness.  No active Achilles tendinitis or plantar fasciitis.  No inflammation at this time.  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  She has been having difficulty sleeping at night due to caring for her loved ones.  Discussed the importance of focusing on stress management, good sleep hygiene, and regular exercise.  She will remain on Cimzia and methotrexate as prescribed.  She will follow up in 5 months or sooner if needed.    High risk medication use - Cimzia 400 mg sq injections every 28 days and Methotrexate 4 tablets by mouth once weekly.  CBC and CMP updated on 06/27/22. Her next lab work will be due in August and every 3 months to monitor for drug toxicity. TB gold negative on 10/04/21. Future order for TB gold placed today.  No recent or recurrent infections. Discussed the importance of holding cimzia and methotrexate if she develops signs or symptoms of an infection and to resume once the infection has completely cleared.  - Plan: QuantiFERON-TB Gold Plus  Screening for tuberculosis - Future order for TB gold placed today. Plan: QuantiFERON-TB Gold Plus  Psoriasis: No active psoriasis at this time.   DDD (degenerative disc disease), cervical: Good ROM with no discomfort at this time.   Osteopenia of multiple sites - DEXA updated on 05/25/20 and results were reviewed today in the office: RFN BMD 0.645 with T-score -1.8.  Due to update DEXA-ordered by gynecologist.   Other medical conditions are listed as  follows:  Stress  History of esophagitis  History of restless legs syndrome    Orders: Orders Placed This Encounter  Procedures   QuantiFERON-TB Gold Plus  No orders of the defined types were placed in this encounter.    Follow-Up Instructions: Return in about 5 months (around 12/24/2022) for Psoriatic arthritis.   Gearldine Bienenstock, PA-C  Note - This record has been created using Dragon software.  Chart creation errors have been sought, but may not always  have been located. Such creation errors do not reflect on  the standard of medical care.

## 2022-07-11 NOTE — Telephone Encounter (Signed)
Returned call to patient and confirmed appointment for 07/13/2022 at 2:00 pm.

## 2022-07-13 ENCOUNTER — Ambulatory Visit: Payer: Medicare Other | Attending: Rheumatology | Admitting: *Deleted

## 2022-07-13 VITALS — BP 166/99 | HR 88

## 2022-07-13 DIAGNOSIS — L4050 Arthropathic psoriasis, unspecified: Secondary | ICD-10-CM | POA: Diagnosis present

## 2022-07-13 MED ORDER — CERTOLIZUMAB PEGOL 2 X 200 MG ~~LOC~~ KIT
400.0000 mg | PACK | Freq: Once | SUBCUTANEOUS | Status: AC
  Administered 2022-07-13: 400 mg via SUBCUTANEOUS

## 2022-07-13 NOTE — Progress Notes (Signed)
Subjective:   Patient presents to clinic today to receive monthly dose of Cimzia.  Patient running a fever or have signs/symptoms of infection? No  Patient currently on antibiotics for the treatment of infection? No  Patient have any upcoming invasive procedures/surgeries? No  Objective: CMP     Component Value Date/Time   NA 141 05/04/2022 1412   K 4.0 05/04/2022 1412   CL 106 05/04/2022 1412   CO2 26 05/04/2022 1412   GLUCOSE 86 05/04/2022 1412   BUN 17 05/04/2022 1412   CREATININE 0.78 05/04/2022 1412   CALCIUM 9.5 05/04/2022 1412   PROT 7.1 05/04/2022 1412   ALBUMIN 4.4 08/07/2016 0911   AST 19 05/04/2022 1412   ALT 25 05/04/2022 1412   ALKPHOS 82 08/07/2016 0911   BILITOT 0.4 05/04/2022 1412   GFRNONAA 73 07/26/2020 1421   GFRAA 85 07/26/2020 1421    CBC    Component Value Date/Time   WBC 8.6 05/04/2022 1412   RBC 5.03 05/04/2022 1412   HGB 14.5 05/04/2022 1412   HGB 14.4 11/25/2013 0943   HCT 43.2 05/04/2022 1412   PLT 223 05/04/2022 1412   MCV 85.9 05/04/2022 1412   MCH 28.8 05/04/2022 1412   MCHC 33.6 05/04/2022 1412   RDW 13.4 05/04/2022 1412   LYMPHSABS 2,520 05/04/2022 1412   MONOABS 560 08/07/2016 0911   EOSABS 146 05/04/2022 1412   BASOSABS 52 05/04/2022 1412    Baseline Immunosuppressant Therapy Labs TB GOLD    Latest Ref Rng & Units 10/04/2021    1:59 PM  Quantiferon TB Gold  Quantiferon TB Gold Plus NEGATIVE NEGATIVE    Hepatitis Panel    Latest Ref Rng & Units 08/07/2016    9:11 AM  Hepatitis  Hep B Surface Ag NEGATIVE NEGATIVE   Hep B IgM NON REACTIVE NON REACTIVE   Hep C Ab NEGATIVE NEGATIVE    HIV Lab Results  Component Value Date   HIV NONREACTIVE 08/07/2016   Immunoglobulins    Latest Ref Rng & Units 08/07/2016    9:11 AM  Immunoglobulin Electrophoresis  IgG 694 - 1,618 mg/dL 6,295   IgM 48 - 284 mg/dL 132    SPEP    Latest Ref Rng & Units 05/04/2022    2:12 PM  Serum Protein Electrophoresis  Total Protein 6.1 - 8.1  g/dL 7.1    G4WN No results found for: "G6PDH" TPMT No results found for: "TPMT"   Chest x-ray: 02/03/2011 No active disease   Assessment/Plan:   Patient tolerated injection well.   Appointment for next injection due August 09, 2022. Patient will be seeing a surgeon soon to discuss having her gallbladder removed. Patient states she will wait to schedule her next injection for after her surgeon has cleared her to resume Cimzia. Patient advised she will need to wait one month after Cimzia to have surgery. Patient expressed understanding and will advise surgeon at her appointment.  Patient due for labs in June 2024, will have at her follow up appointment on 07/24/2022.  Patient is to call and reschedule appointment if running a fever with signs/symptoms of infection, on antibiotics for active infection or has an upcoming invasive procedure.  All questions encouraged and answered.  Instructed patient to call with any further questions or concerns.

## 2022-07-24 ENCOUNTER — Encounter: Payer: Self-pay | Admitting: Physician Assistant

## 2022-07-24 ENCOUNTER — Ambulatory Visit: Payer: Medicare Other | Attending: Physician Assistant | Admitting: Physician Assistant

## 2022-07-24 VITALS — BP 120/83 | HR 81 | Resp 14 | Ht 62.0 in | Wt 181.4 lb

## 2022-07-24 DIAGNOSIS — Z79899 Other long term (current) drug therapy: Secondary | ICD-10-CM | POA: Diagnosis present

## 2022-07-24 DIAGNOSIS — Z8719 Personal history of other diseases of the digestive system: Secondary | ICD-10-CM | POA: Diagnosis present

## 2022-07-24 DIAGNOSIS — L4050 Arthropathic psoriasis, unspecified: Secondary | ICD-10-CM | POA: Insufficient documentation

## 2022-07-24 DIAGNOSIS — Z8669 Personal history of other diseases of the nervous system and sense organs: Secondary | ICD-10-CM | POA: Insufficient documentation

## 2022-07-24 DIAGNOSIS — L409 Psoriasis, unspecified: Secondary | ICD-10-CM | POA: Insufficient documentation

## 2022-07-24 DIAGNOSIS — Z111 Encounter for screening for respiratory tuberculosis: Secondary | ICD-10-CM | POA: Insufficient documentation

## 2022-07-24 DIAGNOSIS — M8589 Other specified disorders of bone density and structure, multiple sites: Secondary | ICD-10-CM | POA: Diagnosis present

## 2022-07-24 DIAGNOSIS — F439 Reaction to severe stress, unspecified: Secondary | ICD-10-CM | POA: Diagnosis present

## 2022-07-24 DIAGNOSIS — M5030 Other cervical disc degeneration, unspecified cervical region: Secondary | ICD-10-CM | POA: Insufficient documentation

## 2022-07-24 NOTE — Patient Instructions (Signed)
Standing Labs We placed an order today for your standing lab work.   Please have your standing labs drawn in August and every 3 months   Please have your labs drawn 2 weeks prior to your appointment so that the provider can discuss your lab results at your appointment, if possible.  Please note that you may see your imaging and lab results in MyChart before we have reviewed them. We will contact you once all results are reviewed. Please allow our office up to 72 hours to thoroughly review all of the results before contacting the office for clarification of your results.  WALK-IN LAB HOURS  Monday through Thursday from 8:00 am -12:30 pm and 1:00 pm-5:00 pm and Friday from 8:00 am-12:00 pm.  Patients with office visits requiring labs will be seen before walk-in labs.  You may encounter longer than normal wait times. Please allow additional time. Wait times may be shorter on  Monday and Thursday afternoons.  We do not book appointments for walk-in labs. We appreciate your patience and understanding with our staff.   Labs are drawn by [facility]. Please bring your co-pay at the time of your lab draw.  You may receive a bill from [facility] for your lab work.  Please note if you are on Hydroxychloroquine and and an order has been placed for a Hydroxychloroquine level,  you will need to have it drawn 4 hours or more after your last dose.  If you wish to have your labs drawn at another location, please call the office 24 hours in advance so we can fax the orders.  The office is located at [address], [address], [place], [place] [address]   If you have any questions regarding directions or hours of operation,  please call [phone].   As a reminder, please drink plenty of water prior to coming for your lab work. Thanks!  

## 2022-08-15 ENCOUNTER — Ambulatory Visit: Payer: Medicare Other | Admitting: *Deleted

## 2022-08-15 VITALS — BP 127/87 | HR 80

## 2022-08-15 DIAGNOSIS — Z79899 Other long term (current) drug therapy: Secondary | ICD-10-CM | POA: Diagnosis not present

## 2022-08-15 DIAGNOSIS — L4050 Arthropathic psoriasis, unspecified: Secondary | ICD-10-CM | POA: Diagnosis present

## 2022-08-15 MED ORDER — CERTOLIZUMAB PEGOL 2 X 200 MG ~~LOC~~ KIT
400.0000 mg | PACK | Freq: Once | SUBCUTANEOUS | Status: AC
  Administered 2022-08-15: 400 mg via SUBCUTANEOUS

## 2022-08-15 NOTE — Progress Notes (Signed)
Subjective:   Patient presents to clinic today to receive monthly dose of Cimzia.  Patient running a fever or have signs/symptoms of infection? No  Patient currently on antibiotics for the treatment of infection? No  Patient have any upcoming invasive procedures/surgeries? No  Objective: CMP     Component Value Date/Time   NA 141 05/04/2022 1412   K 4.0 05/04/2022 1412   CL 106 05/04/2022 1412   CO2 26 05/04/2022 1412   GLUCOSE 86 05/04/2022 1412   BUN 17 05/04/2022 1412   CREATININE 0.78 05/04/2022 1412   CALCIUM 9.5 05/04/2022 1412   PROT 7.1 05/04/2022 1412   ALBUMIN 4.4 08/07/2016 0911   AST 19 05/04/2022 1412   ALT 25 05/04/2022 1412   ALKPHOS 82 08/07/2016 0911   BILITOT 0.4 05/04/2022 1412   GFRNONAA 73 07/26/2020 1421   GFRAA 85 07/26/2020 1421    CBC    Component Value Date/Time   WBC 8.6 05/04/2022 1412   RBC 5.03 05/04/2022 1412   HGB 14.5 05/04/2022 1412   HGB 14.4 11/25/2013 0943   HCT 43.2 05/04/2022 1412   PLT 223 05/04/2022 1412   MCV 85.9 05/04/2022 1412   MCH 28.8 05/04/2022 1412   MCHC 33.6 05/04/2022 1412   RDW 13.4 05/04/2022 1412   LYMPHSABS 2,520 05/04/2022 1412   MONOABS 560 08/07/2016 0911   EOSABS 146 05/04/2022 1412   BASOSABS 52 05/04/2022 1412    Baseline Immunosuppressant Therapy Labs TB GOLD    Latest Ref Rng & Units 10/04/2021    1:59 PM  Quantiferon TB Gold  Quantiferon TB Gold Plus NEGATIVE NEGATIVE    Hepatitis Panel    Latest Ref Rng & Units 08/07/2016    9:11 AM  Hepatitis  Hep B Surface Ag NEGATIVE NEGATIVE   Hep B IgM NON REACTIVE NON REACTIVE   Hep C Ab NEGATIVE NEGATIVE    HIV Lab Results  Component Value Date   HIV NONREACTIVE 08/07/2016   Immunoglobulins    Latest Ref Rng & Units 08/07/2016    9:11 AM  Immunoglobulin Electrophoresis  IgG 694 - 1,618 mg/dL 5,621   IgM 48 - 308 mg/dL 657    SPEP    Latest Ref Rng & Units 05/04/2022    2:12 PM  Serum Protein Electrophoresis  Total Protein 6.1 - 8.1  g/dL 7.1    Q4ON No results found for: "G6PDH" TPMT No results found for: "TPMT"   Chest x-ray: 02/03/2011 No active disease   Assessment/Plan:   Administrations This Visit     certolizumab pegol (CIMZIA) kit 400 mg     Admin Date 08/15/2022 Action Given Dose 400 mg Route Subcutaneous Administered By Henriette Combs, LPN             Patient tolerated injection well.   Appointment for next injection scheduled for September 12, 2022.  Patient due for labs in August 2024.  Patient is to call and reschedule appointment if running a fever with signs/symptoms of infection, on antibiotics for active infection or has an upcoming invasive procedure.  All questions encouraged and answered.  Instructed patient to call with any further questions or concerns.

## 2022-08-23 ENCOUNTER — Telehealth: Payer: Self-pay | Admitting: Rheumatology

## 2022-08-23 NOTE — Telephone Encounter (Signed)
LMOM for patient to call and schedule follow-up appointment.   °

## 2022-08-25 ENCOUNTER — Other Ambulatory Visit: Payer: Self-pay | Admitting: Rheumatology

## 2022-08-25 DIAGNOSIS — L4050 Arthropathic psoriasis, unspecified: Secondary | ICD-10-CM

## 2022-08-25 NOTE — Telephone Encounter (Signed)
Last Fill: 06/01/2022  Labs: 06/27/2022 Sodium 146  Next Visit: Due November 2024. Message sent to the front to schedule.   Last Visit: 07/24/2022  DX: Psoriatic arthropathy   Current Dose per office note 07/24/2022: Methotrexate 4 tablets by mouth once weekly.   Okay to refill Methotrexate?

## 2022-08-25 NOTE — Telephone Encounter (Signed)
Please schedule patient a follow up visit. Patient due November 2024. Thanks!   Follow-Up Instructions: Return in about 5 months (around 12/24/2022) for Psoriatic arthritis.

## 2022-09-12 ENCOUNTER — Ambulatory Visit: Payer: Medicare Other

## 2022-09-13 ENCOUNTER — Ambulatory Visit: Payer: Medicare Other | Attending: Rheumatology | Admitting: *Deleted

## 2022-09-13 VITALS — BP 137/97 | HR 88

## 2022-09-13 DIAGNOSIS — Z79899 Other long term (current) drug therapy: Secondary | ICD-10-CM | POA: Insufficient documentation

## 2022-09-13 DIAGNOSIS — L4050 Arthropathic psoriasis, unspecified: Secondary | ICD-10-CM | POA: Diagnosis not present

## 2022-09-13 DIAGNOSIS — Z111 Encounter for screening for respiratory tuberculosis: Secondary | ICD-10-CM | POA: Insufficient documentation

## 2022-09-13 LAB — CBC WITH DIFFERENTIAL/PLATELET
Absolute Monocytes: 766 cells/uL (ref 200–950)
Basophils Absolute: 47 cells/uL (ref 0–200)
Basophils Relative: 0.6 %
Eosinophils Absolute: 150 cells/uL (ref 15–500)
Eosinophils Relative: 1.9 %
HCT: 44.3 % (ref 35.0–45.0)
Hemoglobin: 14.9 g/dL (ref 11.7–15.5)
Lymphs Abs: 2915 cells/uL (ref 850–3900)
MCH: 29.7 pg (ref 27.0–33.0)
MCHC: 33.6 g/dL (ref 32.0–36.0)
MCV: 88.4 fL (ref 80.0–100.0)
MPV: 11.8 fL (ref 7.5–12.5)
Monocytes Relative: 9.7 %
Neutro Abs: 4021 cells/uL (ref 1500–7800)
Neutrophils Relative %: 50.9 %
Platelets: 211 10*3/uL (ref 140–400)
RBC: 5.01 10*6/uL (ref 3.80–5.10)
RDW: 13.4 % (ref 11.0–15.0)
Total Lymphocyte: 36.9 %
WBC: 7.9 10*3/uL (ref 3.8–10.8)

## 2022-09-13 MED ORDER — CERTOLIZUMAB PEGOL 2 X 200 MG ~~LOC~~ KIT
400.0000 mg | PACK | Freq: Once | SUBCUTANEOUS | Status: AC
Start: 2022-09-13 — End: 2022-09-13
  Administered 2022-09-13: 400 mg via SUBCUTANEOUS

## 2022-09-13 NOTE — Progress Notes (Signed)
Subjective:   Patient presents to clinic today to receive monthly dose of Cimzia.  Patient running a fever or have signs/symptoms of infection? No  Patient currently on antibiotics for the treatment of infection? No  Patient have any upcoming invasive procedures/surgeries? No  Objective: CMP     Component Value Date/Time   NA 141 05/04/2022 1412   K 4.0 05/04/2022 1412   CL 106 05/04/2022 1412   CO2 26 05/04/2022 1412   GLUCOSE 86 05/04/2022 1412   BUN 17 05/04/2022 1412   CREATININE 0.78 05/04/2022 1412   CALCIUM 9.5 05/04/2022 1412   PROT 7.1 05/04/2022 1412   ALBUMIN 4.4 08/07/2016 0911   AST 19 05/04/2022 1412   ALT 25 05/04/2022 1412   ALKPHOS 82 08/07/2016 0911   BILITOT 0.4 05/04/2022 1412   GFRNONAA 73 07/26/2020 1421   GFRAA 85 07/26/2020 1421    CBC    Component Value Date/Time   WBC 8.6 05/04/2022 1412   RBC 5.03 05/04/2022 1412   HGB 14.5 05/04/2022 1412   HGB 14.4 11/25/2013 0943   HCT 43.2 05/04/2022 1412   PLT 223 05/04/2022 1412   MCV 85.9 05/04/2022 1412   MCH 28.8 05/04/2022 1412   MCHC 33.6 05/04/2022 1412   RDW 13.4 05/04/2022 1412   LYMPHSABS 2,520 05/04/2022 1412   MONOABS 560 08/07/2016 0911   EOSABS 146 05/04/2022 1412   BASOSABS 52 05/04/2022 1412    Baseline Immunosuppressant Therapy Labs TB GOLD    Latest Ref Rng & Units 10/04/2021    1:59 PM  Quantiferon TB Gold  Quantiferon TB Gold Plus NEGATIVE NEGATIVE    Hepatitis Panel    Latest Ref Rng & Units 08/07/2016    9:11 AM  Hepatitis  Hep B Surface Ag NEGATIVE NEGATIVE   Hep B IgM NON REACTIVE NON REACTIVE   Hep C Ab NEGATIVE NEGATIVE    HIV Lab Results  Component Value Date   HIV NONREACTIVE 08/07/2016   Immunoglobulins    Latest Ref Rng & Units 08/07/2016    9:11 AM  Immunoglobulin Electrophoresis  IgG 694 - 1,618 mg/dL 6,213   IgM 48 - 086 mg/dL 578    SPEP    Latest Ref Rng & Units 05/04/2022    2:12 PM  Serum Protein Electrophoresis  Total Protein 6.1 - 8.1  g/dL 7.1    I6NG No results found for: "G6PDH" TPMT No results found for: "TPMT"   Chest x-ray: 02/03/2011 No active disease   Assessment/Plan:   Administrations This Visit     certolizumab pegol (CIMZIA) kit 400 mg     Admin Date 09/13/2022 Action Given Dose 400 mg Route Subcutaneous Documented By Henriette Combs, LPN               Patient tolerated injection well.   Appointment for next injection scheduled for 10/12/2022.  Patient due for labs today and were drawn while in office.  Patient is to call and reschedule appointment if running a fever with signs/symptoms of infection, on antibiotics for active infection or has an upcoming invasive procedure.  All questions encouraged and answered.  Instructed patient to call with any further questions or concerns.

## 2022-09-14 NOTE — Progress Notes (Signed)
CBC and CMP are normal.

## 2022-09-18 NOTE — Progress Notes (Signed)
TB gold negative

## 2022-10-12 ENCOUNTER — Ambulatory Visit: Payer: Medicare Other | Attending: Rheumatology | Admitting: *Deleted

## 2022-10-12 VITALS — BP 139/86 | HR 85

## 2022-10-12 DIAGNOSIS — L4050 Arthropathic psoriasis, unspecified: Secondary | ICD-10-CM | POA: Insufficient documentation

## 2022-10-12 MED ORDER — CERTOLIZUMAB PEGOL 2 X 200 MG ~~LOC~~ KIT
400.0000 mg | PACK | Freq: Once | SUBCUTANEOUS | Status: AC
Start: 2022-10-12 — End: 2022-10-12
  Administered 2022-10-12: 400 mg via SUBCUTANEOUS

## 2022-10-12 NOTE — Progress Notes (Signed)
Subjective:   Patient presents to clinic today to receive monthly dose of Cimzia.  Patient running a fever or have signs/symptoms of infection? No  Patient currently on antibiotics for the treatment of infection? No  Patient have any upcoming invasive procedures/surgeries? No  Objective: CMP     Component Value Date/Time   NA 140 09/13/2022 1416   K 4.2 09/13/2022 1416   CL 105 09/13/2022 1416   CO2 26 09/13/2022 1416   GLUCOSE 69 09/13/2022 1416   BUN 14 09/13/2022 1416   CREATININE 0.82 09/13/2022 1416   CALCIUM 9.3 09/13/2022 1416   PROT 6.9 09/13/2022 1416   ALBUMIN 4.4 08/07/2016 0911   AST 17 09/13/2022 1416   ALT 21 09/13/2022 1416   ALKPHOS 82 08/07/2016 0911   BILITOT 0.5 09/13/2022 1416   GFRNONAA 73 07/26/2020 1421   GFRAA 85 07/26/2020 1421    CBC    Component Value Date/Time   WBC 7.9 09/13/2022 1416   RBC 5.01 09/13/2022 1416   HGB 14.9 09/13/2022 1416   HGB 14.4 11/25/2013 0943   HCT 44.3 09/13/2022 1416   PLT 211 09/13/2022 1416   MCV 88.4 09/13/2022 1416   MCH 29.7 09/13/2022 1416   MCHC 33.6 09/13/2022 1416   RDW 13.4 09/13/2022 1416   LYMPHSABS 2,915 09/13/2022 1416   MONOABS 560 08/07/2016 0911   EOSABS 150 09/13/2022 1416   BASOSABS 47 09/13/2022 1416    Baseline Immunosuppressant Therapy Labs TB GOLD    Latest Ref Rng & Units 09/13/2022    2:16 PM  Quantiferon TB Gold  Quantiferon TB Gold Plus NEGATIVE NEGATIVE    Hepatitis Panel    Latest Ref Rng & Units 08/07/2016    9:11 AM  Hepatitis  Hep B Surface Ag NEGATIVE NEGATIVE   Hep B IgM NON REACTIVE NON REACTIVE   Hep C Ab NEGATIVE NEGATIVE    HIV Lab Results  Component Value Date   HIV NONREACTIVE 08/07/2016   Immunoglobulins    Latest Ref Rng & Units 08/07/2016    9:11 AM  Immunoglobulin Electrophoresis  IgG 694 - 1,618 mg/dL 4,098   IgM 48 - 119 mg/dL 147    SPEP    Latest Ref Rng & Units 09/13/2022    2:16 PM  Serum Protein Electrophoresis  Total Protein 6.1 - 8.1  g/dL 6.9    W2NF No results found for: "G6PDH" TPMT No results found for: "TPMT"   Chest x-ray: 02/03/2011 No active disease   Assessment/Plan:   Administrations This Visit     certolizumab pegol (CIMZIA) kit 400 mg     Admin Date 10/12/2022 Action Given Dose 400 mg Route Subcutaneous Documented By Henriette Combs, LPN             Patient tolerated injection well.   Appointment for next injection scheduled for 11/09/2022.  Patient due for labs in November 2024.  Patient is to call and reschedule appointment if running a fever with signs/symptoms of infection, on antibiotics for active infection or has an upcoming invasive procedure.  All questions encouraged and answered.  Instructed patient to call with any further questions or concerns.

## 2022-11-09 ENCOUNTER — Ambulatory Visit: Payer: Medicare Other | Attending: Rheumatology | Admitting: *Deleted

## 2022-11-09 VITALS — BP 135/82 | HR 65

## 2022-11-09 DIAGNOSIS — L4050 Arthropathic psoriasis, unspecified: Secondary | ICD-10-CM | POA: Diagnosis present

## 2022-11-09 MED ORDER — CERTOLIZUMAB PEGOL 2 X 200 MG ~~LOC~~ KIT
400.0000 mg | PACK | Freq: Once | SUBCUTANEOUS | Status: AC
Start: 2022-11-09 — End: 2022-11-09
  Administered 2022-11-09: 400 mg via SUBCUTANEOUS

## 2022-11-09 NOTE — Progress Notes (Signed)
Subjective:   Patient presents to clinic today to receive monthly dose of Cimzia.  Patient running a fever or have signs/symptoms of infection? No  Patient currently on antibiotics for the treatment of infection? No  Patient have any upcoming invasive procedures/surgeries? No  Objective: CMP     Component Value Date/Time   NA 140 09/13/2022 1416   K 4.2 09/13/2022 1416   CL 105 09/13/2022 1416   CO2 26 09/13/2022 1416   GLUCOSE 69 09/13/2022 1416   BUN 14 09/13/2022 1416   CREATININE 0.82 09/13/2022 1416   CALCIUM 9.3 09/13/2022 1416   PROT 6.9 09/13/2022 1416   ALBUMIN 4.4 08/07/2016 0911   AST 17 09/13/2022 1416   ALT 21 09/13/2022 1416   ALKPHOS 82 08/07/2016 0911   BILITOT 0.5 09/13/2022 1416   GFRNONAA 73 07/26/2020 1421   GFRAA 85 07/26/2020 1421    CBC    Component Value Date/Time   WBC 7.9 09/13/2022 1416   RBC 5.01 09/13/2022 1416   HGB 14.9 09/13/2022 1416   HGB 14.4 11/25/2013 0943   HCT 44.3 09/13/2022 1416   PLT 211 09/13/2022 1416   MCV 88.4 09/13/2022 1416   MCH 29.7 09/13/2022 1416   MCHC 33.6 09/13/2022 1416   RDW 13.4 09/13/2022 1416   LYMPHSABS 2,915 09/13/2022 1416   MONOABS 560 08/07/2016 0911   EOSABS 150 09/13/2022 1416   BASOSABS 47 09/13/2022 1416    Baseline Immunosuppressant Therapy Labs TB GOLD    Latest Ref Rng & Units 09/13/2022    2:16 PM  Quantiferon TB Gold  Quantiferon TB Gold Plus NEGATIVE NEGATIVE    Hepatitis Panel    Latest Ref Rng & Units 08/07/2016    9:11 AM  Hepatitis  Hep B Surface Ag NEGATIVE NEGATIVE   Hep B IgM NON REACTIVE NON REACTIVE   Hep C Ab NEGATIVE NEGATIVE    HIV Lab Results  Component Value Date   HIV NONREACTIVE 08/07/2016   Immunoglobulins    Latest Ref Rng & Units 08/07/2016    9:11 AM  Immunoglobulin Electrophoresis  IgG 694 - 1,618 mg/dL 1,610   IgM 48 - 960 mg/dL 454    SPEP    Latest Ref Rng & Units 09/13/2022    2:16 PM  Serum Protein Electrophoresis  Total Protein 6.1 - 8.1  g/dL 6.9    U9WJ No results found for: "G6PDH" TPMT No results found for: "TPMT"   Chest x-ray: 02/03/2011 No active disease   Assessment/Plan:   Administrations This Visit     certolizumab pegol (CIMZIA) kit 400 mg     Admin Date 11/09/2022 Action Given Dose 400 mg Route Subcutaneous Documented By Henriette Combs, LPN             Patient tolerated injection well.   Appointment for next injection scheduled for 12/07/2022.  Patient due for labs in November 2024.  Patient is to call and reschedule appointment if running a fever with signs/symptoms of infection, on antibiotics for active infection or has an upcoming invasive procedure.  All questions encouraged and answered.  Instructed patient to call with any further questions or concerns.

## 2022-12-07 ENCOUNTER — Ambulatory Visit: Payer: Medicare Other | Attending: Rheumatology | Admitting: *Deleted

## 2022-12-07 VITALS — BP 117/79 | HR 92

## 2022-12-07 DIAGNOSIS — L4050 Arthropathic psoriasis, unspecified: Secondary | ICD-10-CM | POA: Diagnosis present

## 2022-12-07 DIAGNOSIS — Z79899 Other long term (current) drug therapy: Secondary | ICD-10-CM | POA: Insufficient documentation

## 2022-12-07 MED ORDER — CERTOLIZUMAB PEGOL 2 X 200 MG ~~LOC~~ KIT
400.0000 mg | PACK | Freq: Once | SUBCUTANEOUS | Status: AC
Start: 2022-12-07 — End: 2022-12-07
  Administered 2022-12-07: 400 mg via SUBCUTANEOUS

## 2022-12-07 NOTE — Progress Notes (Signed)
Subjective:   Patient presents to clinic today to receive monthly dose of Cimzia.  Patient running a fever or have signs/symptoms of infection? No  Patient currently on antibiotics for the treatment of infection? No  Patient have any upcoming invasive procedures/surgeries? No  Objective: CMP     Component Value Date/Time   NA 140 09/13/2022 1416   K 4.2 09/13/2022 1416   CL 105 09/13/2022 1416   CO2 26 09/13/2022 1416   GLUCOSE 69 09/13/2022 1416   BUN 14 09/13/2022 1416   CREATININE 0.82 09/13/2022 1416   CALCIUM 9.3 09/13/2022 1416   PROT 6.9 09/13/2022 1416   ALBUMIN 4.4 08/07/2016 0911   AST 17 09/13/2022 1416   ALT 21 09/13/2022 1416   ALKPHOS 82 08/07/2016 0911   BILITOT 0.5 09/13/2022 1416   GFRNONAA 73 07/26/2020 1421   GFRAA 85 07/26/2020 1421    CBC    Component Value Date/Time   WBC 7.9 09/13/2022 1416   RBC 5.01 09/13/2022 1416   HGB 14.9 09/13/2022 1416   HGB 14.4 11/25/2013 0943   HCT 44.3 09/13/2022 1416   PLT 211 09/13/2022 1416   MCV 88.4 09/13/2022 1416   MCH 29.7 09/13/2022 1416   MCHC 33.6 09/13/2022 1416   RDW 13.4 09/13/2022 1416   LYMPHSABS 2,915 09/13/2022 1416   MONOABS 560 08/07/2016 0911   EOSABS 150 09/13/2022 1416   BASOSABS 47 09/13/2022 1416    Baseline Immunosuppressant Therapy Labs TB GOLD    Latest Ref Rng & Units 09/13/2022    2:16 PM  Quantiferon TB Gold  Quantiferon TB Gold Plus NEGATIVE NEGATIVE    Hepatitis Panel    Latest Ref Rng & Units 08/07/2016    9:11 AM  Hepatitis  Hep B Surface Ag NEGATIVE NEGATIVE   Hep B IgM NON REACTIVE NON REACTIVE   Hep C Ab NEGATIVE NEGATIVE    HIV Lab Results  Component Value Date   HIV NONREACTIVE 08/07/2016   Immunoglobulins    Latest Ref Rng & Units 08/07/2016    9:11 AM  Immunoglobulin Electrophoresis  IgG 694 - 1,618 mg/dL 1,191   IgM 48 - 478 mg/dL 295    SPEP    Latest Ref Rng & Units 09/13/2022    2:16 PM  Serum Protein Electrophoresis  Total Protein 6.1 - 8.1  g/dL 6.9    A2ZH No results found for: "G6PDH" TPMT No results found for: "TPMT"   Chest x-ray: 02/03/2011 No active disease   Assessment/Plan:   Administrations This Visit     certolizumab pegol (CIMZIA) kit 400 mg     Admin Date 12/07/2022 Action Given Dose 400 mg Route Subcutaneous Documented By Henriette Combs, LPN             Patient tolerated injection well.   Appointment for next injection scheduled for 01/08/2023.  Patient due for labs and they were drawn in office today.  Patient is to call and reschedule appointment if running a fever with signs/symptoms of infection, on antibiotics for active infection or has an upcoming invasive procedure.  All questions encouraged and answered.  Instructed patient to call with any further questions or concerns.

## 2022-12-08 LAB — COMPLETE METABOLIC PANEL WITH GFR
AG Ratio: 1.7 (calc) (ref 1.0–2.5)
ALT: 20 U/L (ref 6–29)
AST: 19 U/L (ref 10–35)
Albumin: 4.4 g/dL (ref 3.6–5.1)
Alkaline phosphatase (APISO): 72 U/L (ref 37–153)
BUN: 15 mg/dL (ref 7–25)
CO2: 26 mmol/L (ref 20–32)
Calcium: 9.1 mg/dL (ref 8.6–10.4)
Chloride: 108 mmol/L (ref 98–110)
Creat: 0.85 mg/dL (ref 0.50–1.05)
Globulin: 2.6 g/dL (ref 1.9–3.7)
Glucose, Bld: 116 mg/dL — ABNORMAL HIGH (ref 65–99)
Potassium: 3.7 mmol/L (ref 3.5–5.3)
Sodium: 142 mmol/L (ref 135–146)
Total Bilirubin: 0.5 mg/dL (ref 0.2–1.2)
Total Protein: 7 g/dL (ref 6.1–8.1)
eGFR: 75 mL/min/{1.73_m2} (ref >=60)

## 2022-12-08 LAB — CBC WITH DIFFERENTIAL/PLATELET
Absolute Lymphocytes: 2599 {cells}/uL (ref 850–3900)
Absolute Monocytes: 532 {cells}/uL (ref 200–950)
Basophils Absolute: 38 {cells}/uL (ref 0–200)
Basophils Relative: 0.5 %
Eosinophils Absolute: 137 {cells}/uL (ref 15–500)
Eosinophils Relative: 1.8 %
HCT: 42.7 % (ref 35.0–45.0)
Hemoglobin: 13.9 g/dL (ref 11.7–15.5)
MCH: 28.5 pg (ref 27.0–33.0)
MCHC: 32.6 g/dL (ref 32.0–36.0)
MCV: 87.5 fL (ref 80.0–100.0)
MPV: 11.6 fL (ref 7.5–12.5)
Monocytes Relative: 7 %
Neutro Abs: 4294 {cells}/uL (ref 1500–7800)
Neutrophils Relative %: 56.5 %
Platelets: 210 10*3/uL (ref 140–400)
RBC: 4.88 10*6/uL (ref 3.80–5.10)
RDW: 13.3 % (ref 11.0–15.0)
Total Lymphocyte: 34.2 %
WBC: 7.6 10*3/uL (ref 3.8–10.8)

## 2022-12-08 NOTE — Progress Notes (Signed)
CBC and CMP are normal.

## 2022-12-11 NOTE — Progress Notes (Deleted)
Office Visit Note  Patient: Cynthia Snyder             Date of Birth: 23-Mar-1955           MRN: [id number]             PCP: Dani Gobble, PA-C Referring: Fransisca Kaufmann* Visit Date: 12/25/2022 Occupation: @GUAROCC @  Subjective:  No chief complaint on file.   History of Present Illness: Cynthia Snyder is a 67 y.o. female ***     Activities of Daily Living:  Patient reports morning stiffness for *** {minute/hour:19697}.   Patient {ACTIONS;DENIES/REPORTS:21021675::"Denies"} nocturnal pain.  Difficulty dressing/grooming: {ACTIONS;DENIES/REPORTS:21021675::"Denies"} Difficulty climbing stairs: {ACTIONS;DENIES/REPORTS:21021675::"Denies"} Difficulty getting out of chair: {ACTIONS;DENIES/REPORTS:21021675::"Denies"} Difficulty using hands for taps, buttons, cutlery, and/or writing: {ACTIONS;DENIES/REPORTS:21021675::"Denies"}  No Rheumatology ROS completed.   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   Pain of right hip joint 03/26/2017   Psoriatic arthropathy (HCC) 11/07/2016   Psoriasis 08/04/2016   DJD (degenerative joint disease), cervical 08/04/2016   Esophagitis 08/04/2016   Restless leg syndrome 08/04/2016   High risk medication use 08/04/2016   Atrophic vaginitis 01/22/2015    Class: Chronic    Past Medical History:  Diagnosis Date   Abnormal Pap smear    many yrs ago   Endometrial cancer (HCC)    Endometriosis    Esophagus disorder    Psoriatic arthritis (HCC)    Shingles    Shingles     Family History  Problem Relation Age of Onset   Heart disease Mother    Hypertension Mother    Stroke Mother    Diabetes Mother    Osteoporosis Mother    Hypothyroidism Mother    Heart disease Father    COPD Father    Heart disease Brother    Stroke Brother    Colon cancer Maternal Grandmother    Cancer Paternal Aunt        ovarian   Breast cancer Neg Hx     Past Surgical History:  Procedure Laterality Date   ABDOMINAL HYSTERECTOMY     age 89   APPENDECTOMY     BILATERAL SALPINGOOPHORECTOMY Right 11/24/08   lysis of adhesion   CATARACT EXTRACTION, BILATERAL     esophageal stretched     EXPLORATORY LAPAROTOMY     jaw bone graft  2004   KNEE ARTHROPLASTY     KNEE SURGERY     LAPAROSCOPY     age 17   LIPOSUCTION TRUNK  04/06/2016   Dr. Izora Ribas for body image   LIVER BIOPSY     mild steatosis   NOSE SURGERY     Social History   Social History Narrative   Not on file   Immunization History  Administered Date(s) Administered   Influenza Inj Mdck Quad Pf 10/23/2017   Influenza Split 10/20/2017   Influenza, Seasonal, Injecte, Preservative Fre 10/29/2013, 12/08/2014, 11/18/2015   Moderna Sars-Covid-2 Vaccination 04/28/2019, 05/29/2019   Tdap 11/06/2012   Zoster, Live 10/01/2015     Objective: Vital Signs: LMP 02/06/1982    Physical Exam   Musculoskeletal Exam: ***  CDAI Exam: CDAI Score: -- Patient Global: --; Provider Global: -- Swollen: --; Tender: -- Joint Exam 12/25/2022   No joint exam has been documented for this visit   There is currently no information documented on the homunculus. Go to the Rheumatology activity and complete the homunculus joint exam.  Investigation:  No additional findings.  Imaging: No results found.  Recent Labs: Lab Results  Component Value Date   WBC 7.6 12/07/2022   HGB 13.9 12/07/2022   PLT 210 12/07/2022   NA 142 12/07/2022   K 3.7 12/07/2022   CL 108 12/07/2022   CO2 26 12/07/2022   GLUCOSE 116 (H) 12/07/2022   BUN 15 12/07/2022   CREATININE 0.85 12/07/2022   BILITOT 0.5 12/07/2022   ALKPHOS 82 08/07/2016   AST 19 12/07/2022   ALT 20 12/07/2022   PROT 7.0 12/07/2022   ALBUMIN 4.4 08/07/2016   CALCIUM 9.1 12/07/2022   GFRAA 85 07/26/2020   QFTBGOLDPLUS NEGATIVE 09/13/2022    Speciality Comments: Enbrel discontinued August 2021-not covered by Medicare Cimzia-  started 12/22/2019  Procedures:  No procedures performed Allergies: Patient has no known allergies.   Assessment / Plan:     Visit Diagnoses: Psoriatic arthropathy (HCC)  Psoriasis  High risk medication use  DDD (degenerative disc disease), cervical  Osteopenia of multiple sites  History of esophagitis  History of restless legs syndrome  Orders: No orders of the defined types were placed in this encounter.  No orders of the defined types were placed in this encounter.   Face-to-face time spent with patient was *** minutes. Greater than 50% of time was spent in counseling and coordination of care.  Follow-Up Instructions: No follow-ups on file.   Gearldine Bienenstock, PA-C  Note - This record has been created using Dragon software.  Chart creation errors have been sought, but may not always  have been located. Such creation errors do not reflect on  the standard of medical care.

## 2022-12-25 ENCOUNTER — Ambulatory Visit: Payer: Medicare Other | Admitting: Physician Assistant

## 2022-12-25 DIAGNOSIS — L409 Psoriasis, unspecified: Secondary | ICD-10-CM

## 2022-12-25 DIAGNOSIS — Z8719 Personal history of other diseases of the digestive system: Secondary | ICD-10-CM

## 2022-12-25 DIAGNOSIS — M5030 Other cervical disc degeneration, unspecified cervical region: Secondary | ICD-10-CM

## 2022-12-25 DIAGNOSIS — Z79899 Other long term (current) drug therapy: Secondary | ICD-10-CM

## 2022-12-25 DIAGNOSIS — M8589 Other specified disorders of bone density and structure, multiple sites: Secondary | ICD-10-CM

## 2022-12-25 DIAGNOSIS — Z8669 Personal history of other diseases of the nervous system and sense organs: Secondary | ICD-10-CM

## 2022-12-25 DIAGNOSIS — L4050 Arthropathic psoriasis, unspecified: Secondary | ICD-10-CM

## 2023-01-03 NOTE — Progress Notes (Unsigned)
Office Visit Note  Patient: Cynthia Snyder             Date of Birth: January 30, 1956           MRN: [id number]             PCP: Dani Gobble, PA-C Referring: Fransisca Kaufmann* Visit Date: 01/17/2023 Occupation: @GUAROCC @  Subjective:  Medication monitoring   History of Present Illness: Cynthia Snyder is a 68 y.o. female with history of psoriatic arthritis.  Patient remains on  Cimzia 400 mg sq injections every 28 days and Methotrexate 4 tablets by mouth once weekly.  She has tried in combination therapy without any side effects and has not had any recent gaps in therapy.  She denies any signs or symptoms of a psoriatic arthritis flare.  She has not had any flares of psoriasis recently.  She has occasional discomfort in her right SI joint and has been using a heating pad or pain patches as needed for relief.  She denies any joint swelling at this time.  She experiences intermittent stiffness in her neck.  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  Patient states that overall her symptoms have been manageable. She is overdue to update her mammogram and bone density-plans on scheduling these scans in 2025.   Activities of Daily Living:  Patient reports morning stiffness for 20-30 minutes.   Patient Denies nocturnal pain.  Difficulty dressing/grooming: Denies Difficulty climbing stairs: Reports Difficulty getting out of chair: Denies Difficulty using hands for taps, buttons, cutlery, and/or writing: Denies  Review of Systems  Constitutional:  Negative for fatigue.  HENT:  Positive for mouth dryness. Negative for mouth sores.   Eyes:  Negative for pain, visual disturbance and dryness.  Respiratory:  Negative for cough, shortness of breath and wheezing.   Cardiovascular:  Negative for chest pain and palpitations.  Gastrointestinal:  Negative for blood in stool, constipation and diarrhea.  Endocrine: Negative  for increased urination.  Genitourinary:  Negative for involuntary urination.  Musculoskeletal:  Positive for joint pain, joint pain, myalgias, muscle weakness, morning stiffness and myalgias. Negative for gait problem, joint swelling and muscle tenderness.  Skin:  Negative for color change, rash, hair loss and sensitivity to sunlight.  Allergic/Immunologic: Negative for susceptible to infections.  Neurological:  Negative for dizziness and headaches.  Hematological:  Negative for swollen glands.  Psychiatric/Behavioral:  Positive for sleep disturbance. Negative for depressed mood. The patient is not nervous/anxious.     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   Pain of right hip joint 03/26/2017   Psoriatic arthropathy (HCC) 11/07/2016   Psoriasis 08/04/2016   DJD (degenerative joint disease), cervical 08/04/2016   Esophagitis 08/04/2016   Restless leg syndrome 08/04/2016   High risk medication use 08/04/2016   Atrophic vaginitis 01/22/2015    Class: Chronic    Past Medical History:  Diagnosis Date   Abnormal Pap smear    many yrs ago   Endometrial cancer (HCC)    Endometriosis    Esophagus disorder    Psoriatic arthritis (HCC)    Shingles    Shingles     Family History  Problem Relation Age of Onset   Heart disease Mother    Hypertension Mother    Stroke Mother    Diabetes Mother    Osteoporosis Mother  Hypothyroidism Mother    Heart disease Father    COPD Father    Heart disease Brother    Stroke Brother    Colon cancer Maternal Grandmother    Cancer Paternal Aunt        ovarian   Breast cancer Neg Hx    Past Surgical History:  Procedure Laterality Date   ABDOMINAL HYSTERECTOMY     age 52   APPENDECTOMY     BILATERAL SALPINGOOPHORECTOMY Right 11/24/08   lysis of adhesion   CATARACT EXTRACTION, BILATERAL     esophageal stretched     EXPLORATORY LAPAROTOMY     jaw bone graft   2004   KNEE ARTHROPLASTY     KNEE SURGERY     LAPAROSCOPY     age 74   LIPOSUCTION TRUNK  04/06/2016   Dr. Izora Ribas for body image   LIVER BIOPSY     mild steatosis   NOSE SURGERY     Social History   Social History Narrative   Not on file   Immunization History  Administered Date(s) Administered   Influenza Inj Mdck Quad Pf 10/23/2017   Influenza Split 10/20/2017   Influenza, Seasonal, Injecte, Preservative Fre 10/29/2013, 12/08/2014, 11/18/2015   Moderna Sars-Covid-2 Vaccination 04/28/2019, 05/29/2019   Tdap 11/06/2012   Zoster, Live 10/01/2015     Objective: Vital Signs: BP 117/77 (BP Location: Left Arm, Patient Position: Sitting, Cuff Size: Normal)   Pulse 94   Resp 13   Ht 5\' 2"  (1.575 m)   Wt 181 lb 6.4 oz (82.3 kg)   LMP 02/06/1982   BMI 33.18 kg/m    Physical Exam Vitals and nursing note reviewed.  Constitutional:      Appearance: She is well-developed.  HENT:     Head: Normocephalic and atraumatic.  Eyes:     Conjunctiva/sclera: Conjunctivae normal.  Cardiovascular:     Rate and Rhythm: Normal rate and regular rhythm.     Heart sounds: Normal heart sounds.  Pulmonary:     Effort: Pulmonary effort is normal.     Breath sounds: Normal breath sounds.  Abdominal:     General: Bowel sounds are normal.     Palpations: Abdomen is soft.  Musculoskeletal:     Cervical back: Normal range of motion.  Lymphadenopathy:     Cervical: No cervical adenopathy.  Skin:    General: Skin is warm and dry.     Capillary Refill: Capillary refill takes less than 2 seconds.  Neurological:     Mental Status: She is alert and oriented to person, place, and time.  Psychiatric:        Behavior: Behavior normal.      Musculoskeletal Exam: C-spine has slightly limited range of motion.  No midline spinal tenderness.  Mild right SI joint tenderness.  Shoulder joints, elbow joints, wrist joints, MCPs, PIPs, DIPs have good range of motion with no synovitis.  PIP and DIP  thickening consistent with osteoarthritis of both hands.  Complete fist formation bilaterally.  Hip joints have good range of motion with no groin pain.  Knee joints have good range of motion no warmth or effusion.  Ankle joints have good range of motion with some soreness in the left ankle-chronic.  CDAI Exam: CDAI Score: -- Patient Global: --; Provider Global: -- Swollen: --; Tender: -- Joint Exam 01/17/2023   No joint exam has been documented for this visit   There is currently no information documented on the homunculus. Go to the Rheumatology activity  and complete the homunculus joint exam.  Investigation: No additional findings.  Imaging: No results found.  Recent Labs: Lab Results  Component Value Date   WBC 7.6 12/07/2022   HGB 13.9 12/07/2022   PLT 210 12/07/2022   NA 142 12/07/2022   K 3.7 12/07/2022   CL 108 12/07/2022   CO2 26 12/07/2022   GLUCOSE 116 (H) 12/07/2022   BUN 15 12/07/2022   CREATININE 0.85 12/07/2022   BILITOT 0.5 12/07/2022   ALKPHOS 82 08/07/2016   AST 19 12/07/2022   ALT 20 12/07/2022   PROT 7.0 12/07/2022   ALBUMIN 4.4 08/07/2016   CALCIUM 9.1 12/07/2022   GFRAA 85 07/26/2020   QFTBGOLDPLUS NEGATIVE 09/13/2022    Speciality Comments: Enbrel discontinued August 2021-not covered by Medicare Cimzia- started 12/22/2019  Procedures:  No procedures performed Allergies: Patient has no known allergies.    Assessment / Plan:     Visit Diagnoses: Psoriatic arthropathy (HCC) - She has no synovitis or dactylitis on examination today.  She has not had any signs or symptoms of a flare.  No active psoriasis at this time.  Patient remains on Cimzia 400 mg sq injections every 4 weeks and methotrexate 4 tablets by mouth once weekly.  She is tolerating combination therapy without any side effects or interruptions in therapy.  She experiences intermittent discomfort in the right SI joint.  She has been using a heating pad as well as pain patches as needed  for symptomatic relief.  She has no inflammation on examination today. No medication changes will be made at this time.  She was advised to notify us if she develops signs or symptoms of a flare. She will follow-up in the office in 5 months or sooner if needed.  Plan: methotrexate (RHEUMATREX) 2.5 MG tablet  Psoriasis: No active psoriasis at this time.   High risk medication use - Cimzia 400 mg sq injections every 28 days and Methotrexate 4 tablets by mouth once weekly.  She takes folic acid 400 mcg daily over-the-counter. CBC and CMP updated on 12/07/22.  Her next lab work will be due in January and every 3 months.   TB gold negative on 09/13/22.   Discussed the importance of holding cimzia and methotrexate if she develops signs or symptoms of an infection and to resume once the infection has completely cleared.   DDD (degenerative disc disease), cervical: She experiences intermittent stiffness and tension in her cervical spine.  No symptoms of radiculopathy.  She has slightly limited range of motion with lateral rotation on examination today.  She uses a heating pad as well as pain patches as needed for symptomatic relief.  Osteopenia of multiple sites - DEXA updated on 05/25/20 and results were reviewed today in the office: RFN BMD 0.645 with T-score -1.8.  She is taking a calcium vitamin D supplement daily.  No recent falls or fractures. Due to update DEXA.  Patient plans on having updated bone mammogram and DEXA in 2025.  We will review results with her at her next follow-up visit.  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  History of esophagitis  History of restless legs syndrome  Orders: No orders of the defined types were placed in this encounter.  Meds ordered this encounter  Medications   methotrexate (RHEUMATREX) 2.5 MG tablet    Sig: Take 4 tablets (10 mg total) by mouth once a week.  Dispense:  48  tablet    Refill:  0     Follow-Up Instructions: Return in about 5 months (around 06/17/2023) for Psoriatic arthritis.   Gearldine Bienenstock, PA-C  Note - This record has been created using Dragon software.  Chart creation errors have been sought, but may not always  have been located. Such creation errors do not reflect on  the standard of medical care.

## 2023-01-11 ENCOUNTER — Ambulatory Visit: Payer: Medicare Other | Attending: Rheumatology | Admitting: *Deleted

## 2023-01-11 VITALS — BP 143/91 | HR 78

## 2023-01-11 DIAGNOSIS — Z79899 Other long term (current) drug therapy: Secondary | ICD-10-CM | POA: Insufficient documentation

## 2023-01-11 DIAGNOSIS — L4050 Arthropathic psoriasis, unspecified: Secondary | ICD-10-CM | POA: Insufficient documentation

## 2023-01-11 MED ORDER — CERTOLIZUMAB PEGOL 2 X 200 MG ~~LOC~~ KIT
400.0000 mg | PACK | Freq: Once | SUBCUTANEOUS | Status: AC
Start: 2023-01-11 — End: 2023-01-11
  Administered 2023-01-11: 400 mg via SUBCUTANEOUS

## 2023-01-11 NOTE — Progress Notes (Signed)
Subjective:   Patient presents to clinic today to receive monthly dose of Cimzia.  Patient running a fever or have signs/symptoms of infection? No  Patient currently on antibiotics for the treatment of infection? No  Patient have any upcoming invasive procedures/surgeries? No  Objective: CMP     Component Value Date/Time   NA 142 12/07/2022 1549   K 3.7 12/07/2022 1549   CL 108 12/07/2022 1549   CO2 26 12/07/2022 1549   GLUCOSE 116 (H) 12/07/2022 1549   BUN 15 12/07/2022 1549   CREATININE 0.85 12/07/2022 1549   CALCIUM 9.1 12/07/2022 1549   PROT 7.0 12/07/2022 1549   ALBUMIN 4.4 08/07/2016 0911   AST 19 12/07/2022 1549   ALT 20 12/07/2022 1549   ALKPHOS 82 08/07/2016 0911   BILITOT 0.5 12/07/2022 1549   GFRNONAA 73 07/26/2020 1421   GFRAA 85 07/26/2020 1421    CBC    Component Value Date/Time   WBC 7.6 12/07/2022 1549   RBC 4.88 12/07/2022 1549   HGB 13.9 12/07/2022 1549   HGB 14.4 11/25/2013 0943   HCT 42.7 12/07/2022 1549   PLT 210 12/07/2022 1549   MCV 87.5 12/07/2022 1549   MCH 28.5 12/07/2022 1549   MCHC 32.6 12/07/2022 1549   RDW 13.3 12/07/2022 1549   LYMPHSABS 2,915 09/13/2022 1416   MONOABS 560 08/07/2016 0911   EOSABS 137 12/07/2022 1549   BASOSABS 38 12/07/2022 1549    Baseline Immunosuppressant Therapy Labs TB GOLD    Latest Ref Rng & Units 09/13/2022    2:16 PM  Quantiferon TB Gold  Quantiferon TB Gold Plus NEGATIVE NEGATIVE    Hepatitis Panel    Latest Ref Rng & Units 08/07/2016    9:11 AM  Hepatitis  Hep B Surface Ag NEGATIVE NEGATIVE   Hep B IgM NON REACTIVE NON REACTIVE   Hep C Ab NEGATIVE NEGATIVE    HIV Lab Results  Component Value Date   HIV NONREACTIVE 08/07/2016   Immunoglobulins    Latest Ref Rng & Units 08/07/2016    9:11 AM  Immunoglobulin Electrophoresis  IgG 694 - 1,618 mg/dL 8,657   IgM 48 - 846 mg/dL 962    SPEP    Latest Ref Rng & Units 12/07/2022    3:49 PM  Serum Protein Electrophoresis  Total Protein 6.1 -  8.1 g/dL 7.0    X5MW No results found for: "G6PDH" TPMT No results found for: "TPMT"   Chest x-ray: 02/03/2011 No active disease   Assessment/Plan:   Administrations This Visit     certolizumab pegol (CIMZIA) kit 400 mg     Admin Date 01/11/2023 Action Given Dose 400 mg Route Subcutaneous Documented By Henriette Combs, LPN             Patient tolerated injection well.   Appointment for next injection scheduled for 02/15/2023.  Patient due for labs in January 2025.  Patient is to call and reschedule appointment if running a fever with signs/symptoms of infection, on antibiotics for active infection or has an upcoming invasive procedure.  All questions encouraged and answered.  Instructed patient to call with any further questions or concerns.

## 2023-01-12 ENCOUNTER — Telehealth: Payer: Self-pay | Admitting: Pharmacist

## 2023-01-12 NOTE — Telephone Encounter (Signed)
Left VM for patient confirming insurance for 2025. She currently has Medicare + [employer] supplement.  Requested return call ONLY if she is anticipating insurance changes  Reverification of Cimzia in-office injection benefits submitted on [employer] portal since deadline is this upcoming Monday  Chesley Mires, PharmD, MPH, BCPS, CPP Clinical Pharmacist (Rheumatology and Pulmonology)

## 2023-01-16 ENCOUNTER — Ambulatory Visit: Payer: Medicare Other | Admitting: Physician Assistant

## 2023-01-16 NOTE — Telephone Encounter (Signed)
Changing from [employer] to [facility] Value Script PDT (Does not have card yet.) Plan is [id number]  [phone] is her CB #

## 2023-01-16 NOTE — Telephone Encounter (Signed)
Will need to re-run benefits for 2025 for insurance change. Her first Cimzia injection in 2025 will be on 02/15/2023

## 2023-01-17 ENCOUNTER — Encounter: Payer: Self-pay | Admitting: Physician Assistant

## 2023-01-17 ENCOUNTER — Ambulatory Visit: Payer: Medicare Other | Attending: Physician Assistant | Admitting: Physician Assistant

## 2023-01-17 VITALS — BP 117/77 | HR 94 | Resp 13 | Ht 62.0 in | Wt 181.4 lb

## 2023-01-17 DIAGNOSIS — L4050 Arthropathic psoriasis, unspecified: Secondary | ICD-10-CM | POA: Diagnosis present

## 2023-01-17 DIAGNOSIS — M8589 Other specified disorders of bone density and structure, multiple sites: Secondary | ICD-10-CM | POA: Insufficient documentation

## 2023-01-17 DIAGNOSIS — Z8719 Personal history of other diseases of the digestive system: Secondary | ICD-10-CM | POA: Diagnosis present

## 2023-01-17 DIAGNOSIS — Z8669 Personal history of other diseases of the nervous system and sense organs: Secondary | ICD-10-CM | POA: Insufficient documentation

## 2023-01-17 DIAGNOSIS — Z79899 Other long term (current) drug therapy: Secondary | ICD-10-CM | POA: Diagnosis present

## 2023-01-17 DIAGNOSIS — M5030 Other cervical disc degeneration, unspecified cervical region: Secondary | ICD-10-CM | POA: Insufficient documentation

## 2023-01-17 DIAGNOSIS — F439 Reaction to severe stress, unspecified: Secondary | ICD-10-CM | POA: Diagnosis present

## 2023-01-17 DIAGNOSIS — L409 Psoriasis, unspecified: Secondary | ICD-10-CM | POA: Insufficient documentation

## 2023-01-17 MED ORDER — METHOTREXATE SODIUM 2.5 MG PO TABS
10.0000 mg | ORAL_TABLET | ORAL | 0 refills | Status: DC
Start: 2023-01-17 — End: 2023-08-06

## 2023-01-17 NOTE — Patient Instructions (Signed)

## 2023-02-08 NOTE — Telephone Encounter (Signed)
 Her next Cimzia injection is on 02/15/2023. We will need updated insurance. She will plan to stop by clinic today to have front desk make copy of new insurance card  Chesley Mires, PharmD, MPH, BCPS, CPP Clinical Pharmacist (Rheumatology and Pulmonology)

## 2023-02-12 NOTE — Telephone Encounter (Signed)
 Appears that patient's [facility] plan is only for Prescription Part D drugs. Confirmed with patient.  She is keeping her [employer] supplemental plan. Patient has Medicare A/B. Her [employer] plan followed Medicare guidelines. Pre-certification is not required.    Sherry Pennant, PharmD, MPH, BCPS, CPP Clinical Pharmacist (Rheumatology and Pulmonology)

## 2023-02-15 ENCOUNTER — Ambulatory Visit: Payer: Medicare Other | Attending: Rheumatology | Admitting: *Deleted

## 2023-02-15 VITALS — BP 141/85 | HR 85

## 2023-02-15 DIAGNOSIS — L4050 Arthropathic psoriasis, unspecified: Secondary | ICD-10-CM | POA: Insufficient documentation

## 2023-02-15 MED ORDER — CERTOLIZUMAB PEGOL 2 X 200 MG ~~LOC~~ KIT
400.0000 mg | PACK | Freq: Once | SUBCUTANEOUS | Status: AC
Start: 2023-02-15 — End: 2023-02-15
  Administered 2023-02-15: 400 mg via SUBCUTANEOUS

## 2023-02-15 NOTE — Progress Notes (Signed)
 Subjective:   Patient presents to clinic today to receive monthly dose of Cimzia .  Patient running a fever or have signs/symptoms of infection? No  Patient currently on antibiotics for the treatment of infection? No  Patient have any upcoming invasive procedures/surgeries? No  Objective: CMP     Component Value Date/Time   NA 142 12/07/2022 1549   K 3.7 12/07/2022 1549   CL 108 12/07/2022 1549   CO2 26 12/07/2022 1549   GLUCOSE 116 (H) 12/07/2022 1549   BUN 15 12/07/2022 1549   CREATININE 0.85 12/07/2022 1549   CALCIUM 9.1 12/07/2022 1549   PROT 7.0 12/07/2022 1549   ALBUMIN 4.4 08/07/2016 0911   AST 19 12/07/2022 1549   ALT 20 12/07/2022 1549   ALKPHOS 82 08/07/2016 0911   BILITOT 0.5 12/07/2022 1549   GFRNONAA 73 07/26/2020 1421   GFRAA 85 07/26/2020 1421    CBC    Component Value Date/Time   WBC 7.6 12/07/2022 1549   RBC 4.88 12/07/2022 1549   HGB 13.9 12/07/2022 1549   HGB 14.4 11/25/2013 0943   HCT 42.7 12/07/2022 1549   PLT 210 12/07/2022 1549   MCV 87.5 12/07/2022 1549   MCH 28.5 12/07/2022 1549   MCHC 32.6 12/07/2022 1549   RDW 13.3 12/07/2022 1549   LYMPHSABS 2,915 09/13/2022 1416   MONOABS 560 08/07/2016 0911   EOSABS 137 12/07/2022 1549   BASOSABS 38 12/07/2022 1549    Baseline Immunosuppressant Therapy Labs TB GOLD    Latest Ref Rng & Units 09/13/2022    2:16 PM  Quantiferon TB Gold  Quantiferon TB Gold Plus NEGATIVE NEGATIVE    Hepatitis Panel    Latest Ref Rng & Units 08/07/2016    9:11 AM  Hepatitis  Hep B Surface Ag NEGATIVE NEGATIVE   Hep B IgM NON REACTIVE NON REACTIVE   Hep C Ab NEGATIVE NEGATIVE    HIV Lab Results  Component Value Date   HIV NONREACTIVE 08/07/2016   Immunoglobulins    Latest Ref Rng & Units 08/07/2016    9:11 AM  Immunoglobulin Electrophoresis  IgG 694 - 1,618 mg/dL 8,885   IgM 48 - 728 mg/dL 848    SPEP    Latest Ref Rng & Units 12/07/2022    3:49 PM  Serum Protein Electrophoresis  Total Protein 6.1 -  8.1 g/dL 7.0    H3EI No results found for: G6PDH TPMT No results found for: TPMT   Chest x-ray:  02/03/2011 No active disease   Assessment/Plan:   Administrations This Visit     certolizumab pegol  (CIMZIA ) kit 400 mg     Admin Date 02/15/2023 Action Given Dose 400 mg Route Subcutaneous Documented By Cena Alfonso CROME, LPN             Patient tolerated injection well.   Appointment for next injection scheduled for 03/15/2023.  Patient due for labs in end of January/early February 2025.  Patient is to call and reschedule appointment if running a fever with signs/symptoms of infection, on antibiotics for active infection or has an upcoming invasive procedure.  All questions encouraged and answered.  Instructed patient to call with any further questions or concerns.

## 2023-02-23 NOTE — Telephone Encounter (Signed)
[employer] verification of benefits scanned into media tab  Case ID: [id number]  Chesley Mires, PharmD, MPH, BCPS, CPP Clinical Pharmacist (Rheumatology and Pulmonology)

## 2023-03-13 ENCOUNTER — Telehealth: Payer: Self-pay | Admitting: *Deleted

## 2023-03-13 NOTE — Telephone Encounter (Signed)
 Patient contacted the office and left message stating she had cancelled her Cimzia  appointment due to her and her husband having the Norovirus. She states her husband ended up in the hospital and he just got released yesterday. Patient is due for her Cimzia  on 03/15/2023.  Attempted to contact the patient and left message for patient to call the office.

## 2023-03-15 ENCOUNTER — Ambulatory Visit: Payer: Medicare Other

## 2023-03-22 ENCOUNTER — Ambulatory Visit: Payer: Medicare Other

## 2023-03-23 ENCOUNTER — Ambulatory Visit: Payer: Medicare Other

## 2023-03-26 ENCOUNTER — Ambulatory Visit: Payer: Medicare Other | Attending: Rheumatology | Admitting: *Deleted

## 2023-03-26 VITALS — BP 143/89 | HR 72

## 2023-03-26 DIAGNOSIS — Z79899 Other long term (current) drug therapy: Secondary | ICD-10-CM | POA: Insufficient documentation

## 2023-03-26 DIAGNOSIS — L4050 Arthropathic psoriasis, unspecified: Secondary | ICD-10-CM | POA: Insufficient documentation

## 2023-03-26 MED ORDER — CERTOLIZUMAB PEGOL 2 X 200 MG ~~LOC~~ KIT
400.0000 mg | PACK | Freq: Once | SUBCUTANEOUS | Status: AC
  Administered 2023-03-26: 400 mg via SUBCUTANEOUS

## 2023-03-26 NOTE — Progress Notes (Signed)
 Subjective:   Patient presents to clinic today to receive monthly dose of Cimzia.  Patient running a fever or have signs/symptoms of infection? No  Patient currently on antibiotics for the treatment of infection? No  Patient have any upcoming invasive procedures/surgeries? No  Objective: CMP     Component Value Date/Time   NA 142 12/07/2022 1549   K 3.7 12/07/2022 1549   CL 108 12/07/2022 1549   CO2 26 12/07/2022 1549   GLUCOSE 116 (H) 12/07/2022 1549   BUN 15 12/07/2022 1549   CREATININE 0.85 12/07/2022 1549   CALCIUM 9.1 12/07/2022 1549   PROT 7.0 12/07/2022 1549   ALBUMIN 4.4 08/07/2016 0911   AST 19 12/07/2022 1549   ALT 20 12/07/2022 1549   ALKPHOS 82 08/07/2016 0911   BILITOT 0.5 12/07/2022 1549   GFRNONAA 73 07/26/2020 1421   GFRAA 85 07/26/2020 1421    CBC    Component Value Date/Time   WBC 7.6 12/07/2022 1549   RBC 4.88 12/07/2022 1549   HGB 13.9 12/07/2022 1549   HGB 14.4 11/25/2013 0943   HCT 42.7 12/07/2022 1549   PLT 210 12/07/2022 1549   MCV 87.5 12/07/2022 1549   MCH 28.5 12/07/2022 1549   MCHC 32.6 12/07/2022 1549   RDW 13.3 12/07/2022 1549   LYMPHSABS 2,915 09/13/2022 1416   MONOABS 560 08/07/2016 0911   EOSABS 137 12/07/2022 1549   BASOSABS 38 12/07/2022 1549    Baseline Immunosuppressant Therapy Labs TB GOLD    Latest Ref Rng & Units 09/13/2022    2:16 PM  Quantiferon TB Gold  Quantiferon TB Gold Plus NEGATIVE NEGATIVE    Hepatitis Panel    Latest Ref Rng & Units 08/07/2016    9:11 AM  Hepatitis  Hep B Surface Ag NEGATIVE NEGATIVE   Hep B IgM NON REACTIVE NON REACTIVE   Hep C Ab NEGATIVE NEGATIVE    HIV Lab Results  Component Value Date   HIV NONREACTIVE 08/07/2016   Immunoglobulins    Latest Ref Rng & Units 08/07/2016    9:11 AM  Immunoglobulin Electrophoresis  IgG 694 - 1,618 mg/dL 1,610   IgM 48 - 960 mg/dL 454    SPEP    Latest Ref Rng & Units 12/07/2022    3:49 PM  Serum Protein Electrophoresis  Total Protein 6.1 -  8.1 g/dL 7.0    U9WJ No results found for: "G6PDH" TPMT No results found for: "TPMT"   Chest x-ray: 02/03/2011 No active disease  Assessment/Plan:   Administrations This Visit     certolizumab pegol (CIMZIA) kit 400 mg     Admin Date 03/26/2023 Action Given Dose 400 mg Route Subcutaneous Documented By Henriette Combs, LPN             Patient tolerated injection well.   Appointment for next injection scheduled for 04/26/2023 .  Patient due for labs and were drawn while in office.  Patient is to call and reschedule appointment if running a fever with signs/symptoms of infection, on antibiotics for active infection or has an upcoming invasive procedure.  All questions encouraged and answered.  Instructed patient to call with any further questions or concerns.

## 2023-03-27 LAB — COMPLETE METABOLIC PANEL WITH GFR
AG Ratio: 1.6 (calc) (ref 1.0–2.5)
ALT: 23 U/L (ref 6–29)
AST: 18 U/L (ref 10–35)
Albumin: 4.4 g/dL (ref 3.6–5.1)
Alkaline phosphatase (APISO): 64 U/L (ref 37–153)
BUN: 13 mg/dL (ref 7–25)
CO2: 28 mmol/L (ref 20–32)
Calcium: 9.1 mg/dL (ref 8.6–10.4)
Chloride: 105 mmol/L (ref 98–110)
Creat: 0.72 mg/dL (ref 0.50–1.05)
Globulin: 2.7 g/dL (ref 1.9–3.7)
Glucose, Bld: 86 mg/dL (ref 65–99)
Potassium: 3.9 mmol/L (ref 3.5–5.3)
Sodium: 140 mmol/L (ref 135–146)
Total Bilirubin: 0.7 mg/dL (ref 0.2–1.2)
Total Protein: 7.1 g/dL (ref 6.1–8.1)
eGFR: 91 mL/min/{1.73_m2} (ref >=60)

## 2023-03-27 LAB — CBC WITH DIFFERENTIAL/PLATELET
Absolute Lymphocytes: 2762 {cells}/uL (ref 850–3900)
Absolute Monocytes: 591 {cells}/uL (ref 200–950)
Basophils Absolute: 49 {cells}/uL (ref 0–200)
Basophils Relative: 0.6 %
Eosinophils Absolute: 138 {cells}/uL (ref 15–500)
Eosinophils Relative: 1.7 %
HCT: 42.6 % (ref 35.0–45.0)
Hemoglobin: 14.4 g/dL (ref 11.7–15.5)
MCH: 29 pg (ref 27.0–33.0)
MCHC: 33.8 g/dL (ref 32.0–36.0)
MCV: 85.9 fL (ref 80.0–100.0)
MPV: 12 fL (ref 7.5–12.5)
Monocytes Relative: 7.3 %
Neutro Abs: 4560 {cells}/uL (ref 1500–7800)
Neutrophils Relative %: 56.3 %
Platelets: 231 10*3/uL (ref 140–400)
RBC: 4.96 10*6/uL (ref 3.80–5.10)
RDW: 13.4 % (ref 11.0–15.0)
Total Lymphocyte: 34.1 %
WBC: 8.1 10*3/uL (ref 3.8–10.8)

## 2023-03-27 NOTE — Progress Notes (Signed)
 CBC and CMP normal

## 2023-04-26 ENCOUNTER — Ambulatory Visit: Payer: Medicare Other

## 2023-04-27 ENCOUNTER — Telehealth: Payer: Self-pay | Admitting: *Deleted

## 2023-04-27 NOTE — Telephone Encounter (Signed)
 Attempted to contact the patient to reschedule Cimzia injection. Left message with patient's spouse to have her call the office Monday to reschedule.

## 2023-04-30 ENCOUNTER — Ambulatory Visit: Attending: Rheumatology | Admitting: *Deleted

## 2023-04-30 VITALS — BP 150/95 | HR 69

## 2023-04-30 DIAGNOSIS — L4050 Arthropathic psoriasis, unspecified: Secondary | ICD-10-CM | POA: Insufficient documentation

## 2023-04-30 MED ORDER — CERTOLIZUMAB PEGOL 2 X 200 MG ~~LOC~~ KIT
400.0000 mg | PACK | Freq: Once | SUBCUTANEOUS | Status: AC
  Administered 2023-04-30: 400 mg via SUBCUTANEOUS

## 2023-04-30 NOTE — Progress Notes (Signed)
 Pharmacy Note  Subjective:   Patient presents to clinic today to receive monthly dose of Cimzia.  Patient running a fever or have signs/symptoms of infection? No  Patient currently on antibiotics for the treatment of infection? No  Patient have any upcoming invasive procedures/surgeries? No  Objective: CMP     Component Value Date/Time   NA 140 03/26/2023 1359   K 3.9 03/26/2023 1359   CL 105 03/26/2023 1359   CO2 28 03/26/2023 1359   GLUCOSE 86 03/26/2023 1359   BUN 13 03/26/2023 1359   CREATININE 0.72 03/26/2023 1359   CALCIUM 9.1 03/26/2023 1359   PROT 7.1 03/26/2023 1359   ALBUMIN 4.4 08/07/2016 0911   AST 18 03/26/2023 1359   ALT 23 03/26/2023 1359   ALKPHOS 82 08/07/2016 0911   BILITOT 0.7 03/26/2023 1359   GFRNONAA 73 07/26/2020 1421   GFRAA 85 07/26/2020 1421    CBC    Component Value Date/Time   WBC 8.1 03/26/2023 1359   RBC 4.96 03/26/2023 1359   HGB 14.4 03/26/2023 1359   HGB 14.4 11/25/2013 0943   HCT 42.6 03/26/2023 1359   PLT 231 03/26/2023 1359   MCV 85.9 03/26/2023 1359   MCH 29.0 03/26/2023 1359   MCHC 33.8 03/26/2023 1359   RDW 13.4 03/26/2023 1359   LYMPHSABS 2,915 09/13/2022 1416   MONOABS 560 08/07/2016 0911   EOSABS 138 03/26/2023 1359   BASOSABS 49 03/26/2023 1359    Baseline Immunosuppressant Therapy Labs TB GOLD    Latest Ref Rng & Units 09/13/2022    2:16 PM  Quantiferon TB Gold  Quantiferon TB Gold Plus NEGATIVE NEGATIVE    Hepatitis Panel    Latest Ref Rng & Units 08/07/2016    9:11 AM  Hepatitis  Hep B Surface Ag NEGATIVE NEGATIVE   Hep B IgM NON REACTIVE NON REACTIVE   Hep C Ab NEGATIVE NEGATIVE    HIV Lab Results  Component Value Date   HIV NONREACTIVE 08/07/2016   Immunoglobulins    Latest Ref Rng & Units 08/07/2016    9:11 AM  Immunoglobulin Electrophoresis  IgG 694 - 1,618 mg/dL 0,102   IgM 48 - 725 mg/dL 366    SPEP    Latest Ref Rng & Units 03/26/2023    1:59 PM  Serum Protein Electrophoresis  Total  Protein 6.1 - 8.1 g/dL 7.1    Y4IH No results found for: "G6PDH" TPMT No results found for: "TPMT"   Chest x-ray: 02/03/2011 No active disease   Assessment/Plan:   Administrations This Visit     certolizumab pegol (CIMZIA) kit 400 mg     Admin Date 04/30/2023 Action Given Dose 400 mg Route Subcutaneous Documented By Henriette Combs, LPN             Patient tolerated injection well.   Appointment for next injection scheduled for 05/31/2023.  Patient due for labs in May 2025.  Patient is to call and reschedule appointment if running a fever with signs/symptoms of infection, on antibiotics for active infection or has an upcoming invasive procedure.  All questions encouraged and answered.  Instructed patient to call with any further questions or concerns.

## 2023-05-31 ENCOUNTER — Ambulatory Visit: Attending: Rheumatology | Admitting: *Deleted

## 2023-05-31 VITALS — BP 128/86 | HR 86

## 2023-05-31 DIAGNOSIS — L4050 Arthropathic psoriasis, unspecified: Secondary | ICD-10-CM | POA: Diagnosis present

## 2023-05-31 MED ORDER — CERTOLIZUMAB PEGOL 2 X 200 MG ~~LOC~~ KIT
400.0000 mg | PACK | Freq: Once | SUBCUTANEOUS | Status: AC
Start: 2023-05-31 — End: 2023-05-31
  Administered 2023-05-31: 400 mg via SUBCUTANEOUS

## 2023-05-31 NOTE — Progress Notes (Signed)
 Subjective:   Patient presents to clinic today to receive monthly dose of Cimzia .  Patient running a fever or have signs/symptoms of infection? No  Patient currently on antibiotics for the treatment of infection? No  Patient have any upcoming invasive procedures/surgeries? No  Objective: CMP     Component Value Date/Time   NA 140 03/26/2023 1359   K 3.9 03/26/2023 1359   CL 105 03/26/2023 1359   CO2 28 03/26/2023 1359   GLUCOSE 86 03/26/2023 1359   BUN 13 03/26/2023 1359   CREATININE 0.72 03/26/2023 1359   CALCIUM 9.1 03/26/2023 1359   PROT 7.1 03/26/2023 1359   ALBUMIN 4.4 08/07/2016 0911   AST 18 03/26/2023 1359   ALT 23 03/26/2023 1359   ALKPHOS 82 08/07/2016 0911   BILITOT 0.7 03/26/2023 1359   GFRNONAA 73 07/26/2020 1421   GFRAA 85 07/26/2020 1421    CBC    Component Value Date/Time   WBC 8.1 03/26/2023 1359   RBC 4.96 03/26/2023 1359   HGB 14.4 03/26/2023 1359   HGB 14.4 11/25/2013 0943   HCT 42.6 03/26/2023 1359   PLT 231 03/26/2023 1359   MCV 85.9 03/26/2023 1359   MCH 29.0 03/26/2023 1359   MCHC 33.8 03/26/2023 1359   RDW 13.4 03/26/2023 1359   LYMPHSABS 2,915 09/13/2022 1416   MONOABS 560 08/07/2016 0911   EOSABS 138 03/26/2023 1359   BASOSABS 49 03/26/2023 1359    Baseline Immunosuppressant Therapy Labs TB GOLD    Latest Ref Rng & Units 09/13/2022    2:16 PM  Quantiferon TB Gold  Quantiferon TB Gold Plus NEGATIVE NEGATIVE    Hepatitis Panel    Latest Ref Rng & Units 08/07/2016    9:11 AM  Hepatitis  Hep B Surface Ag NEGATIVE NEGATIVE   Hep B IgM NON REACTIVE NON REACTIVE   Hep C Ab NEGATIVE NEGATIVE    HIV Lab Results  Component Value Date   HIV NONREACTIVE 08/07/2016   Immunoglobulins    Latest Ref Rng & Units 08/07/2016    9:11 AM  Immunoglobulin Electrophoresis  IgG 694 - 1,618 mg/dL 1,610   IgM 48 - 960 mg/dL 454    SPEP    Latest Ref Rng & Units 03/26/2023    1:59 PM  Serum Protein Electrophoresis  Total Protein 6.1 - 8.1  g/dL 7.1    U9WJ No results found for: "G6PDH" TPMT No results found for: "TPMT"   Chest x-ray: 02/03/2011 No active disease   Assessment/Plan:   Administrations This Visit     certolizumab pegol  (CIMZIA ) kit 400 mg     Admin Date 05/31/2023 Action Given Dose 400 mg Route Subcutaneous Documented By Adrianne Horn, LPN             Patient tolerated injection well.   Appointment for next injection scheduled for 06/28/2023.  Patient due for labs in May 2025.  Patient is to call and reschedule appointment if running a fever with signs/symptoms of infection, on antibiotics for active infection or has an upcoming invasive procedure.  All questions encouraged and answered.  Instructed patient to call with any further questions or concerns.

## 2023-06-05 ENCOUNTER — Telehealth: Payer: Self-pay | Admitting: *Deleted

## 2023-06-05 NOTE — Telephone Encounter (Signed)
 Attempted to contact the patient and left message to advise patient as she has an appointment with Dr. Alvira Josephs on 07/04/2023, we can do her Cimzia  at her appointment. Patient advised to call the office if this does not work for her.

## 2023-06-20 NOTE — Progress Notes (Signed)
 Office Visit Note  Patient: Cynthia Snyder             Date of Birth: Nov 24, 1955           MRN: [id number]             PCP: Jearlean Mince, PA-C Referring: Isabelle Maple* Visit Date: 07/04/2023 Occupation: @GUAROCC @  Subjective:  Intermittent right knee pain  History of Present Illness: Cynthia Snyder is a 68 y.o. female with psoriatic arthritis and psoriasis.  She returns today after her last visit in December 2024.  She states she had right knee joint surgery in the past and has intermittent sensation of right knee giving out on her.  She has not noticed any joint inflammation.  She denies any discomfort on a regular basis.  There is no history of joint stiffness.  None of the other joints are painful or swollen.  She states her psoriatic arthritis and psoriasis has been very well-controlled on Cimzia  400 mg subcu every 28 days and methotrexate  4 tablets by mouth weekly along with folic acid 400 mcg over-the-counter.    Activities of Daily Living:  Patient reports morning stiffness for 20-30 minutes.   Patient Reports nocturnal pain.  Difficulty dressing/grooming: Denies Difficulty climbing stairs: Reports Difficulty getting out of chair: Denies Difficulty using hands for taps, buttons, cutlery, and/or writing: Denies  Review of Systems  Constitutional:  Positive for fatigue.  HENT:  Positive for mouth dryness. Negative for mouth sores.   Eyes:  Negative for dryness.  Respiratory:  Negative for shortness of breath.   Cardiovascular:  Negative for chest pain and palpitations.  Gastrointestinal:  Negative for blood in stool, constipation and diarrhea.  Endocrine: Positive for increased urination.  Genitourinary:  Negative for involuntary urination.  Musculoskeletal:  Positive for joint pain, gait problem, joint pain, myalgias, morning stiffness, muscle tenderness and myalgias. Negative for joint swelling and muscle weakness.  Skin:  Negative for  color change, rash, hair loss and sensitivity to sunlight.  Allergic/Immunologic: Negative for susceptible to infections.  Neurological:  Negative for dizziness and headaches.  Hematological:  Negative for swollen glands.  Psychiatric/Behavioral:  Positive for sleep disturbance. Negative for depressed mood. The patient is not nervous/anxious.     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   Pain of right hip joint 03/26/2017   Psoriatic arthropathy (HCC) 11/07/2016   Psoriasis 08/04/2016   DJD (degenerative joint disease), cervical 08/04/2016   Esophagitis 08/04/2016   Restless leg syndrome 08/04/2016   High risk medication use 08/04/2016   Atrophic vaginitis 01/22/2015    Class: Chronic    Past Medical History:  Diagnosis Date   Abnormal Pap smear    many yrs ago   Endometrial cancer (HCC)    Endometriosis    Esophagus disorder    Psoriatic arthritis (HCC)    Shingles    Shingles     Family History  Problem Relation Age of Onset   Heart disease Mother    Hypertension Mother    Stroke Mother    Diabetes Mother    Osteoporosis Mother    Hypothyroidism Mother    Heart disease Father    COPD Father    Heart disease Brother    Stroke Brother    Cancer Paternal Aunt        ovarian   Colon cancer Maternal Grandmother    Breast cancer  Neg Hx    Past Surgical History:  Procedure Laterality Date   ABDOMINAL HYSTERECTOMY     age 35   APPENDECTOMY     BILATERAL SALPINGOOPHORECTOMY Right 11/24/08   lysis of adhesion   CATARACT EXTRACTION, BILATERAL     esophageal stretched     EXPLORATORY LAPAROTOMY     jaw bone graft  2004   KNEE ARTHROPLASTY     KNEE SURGERY     LAPAROSCOPY     age 77   LIPOSUCTION TRUNK  04/06/2016   Dr. Jonna Netter for body image   LIVER BIOPSY     mild steatosis   NOSE SURGERY     Social History   Social History Narrative   Not on file   Immunization History   Administered Date(s) Administered   Influenza Inj Mdck Quad Pf 10/23/2017   Influenza Split 10/20/2017   Influenza, Seasonal, Injecte, Preservative Fre 10/29/2013, 12/08/2014, 11/18/2015   Moderna Sars-Covid-2 Vaccination 04/28/2019, 05/29/2019   Tdap 11/06/2012   Zoster, Live 10/01/2015     Objective: Vital Signs: BP 134/89 (BP Location: Left Arm, Patient Position: Sitting, Cuff Size: Normal)   Pulse 73   Resp 16   Ht 5\' 2"  (1.575 m)   Wt 179 lb (81.2 kg)   LMP 02/06/1982   BMI 32.74 kg/m    Physical Exam Vitals and nursing note reviewed.  Constitutional:      Appearance: She is well-developed.  HENT:     Head: Normocephalic and atraumatic.  Eyes:     Conjunctiva/sclera: Conjunctivae normal.  Cardiovascular:     Rate and Rhythm: Normal rate and regular rhythm.     Heart sounds: Normal heart sounds.  Pulmonary:     Effort: Pulmonary effort is normal.     Breath sounds: Normal breath sounds.  Abdominal:     General: Bowel sounds are normal.     Palpations: Abdomen is soft.  Musculoskeletal:     Cervical back: Normal range of motion.  Lymphadenopathy:     Cervical: No cervical adenopathy.  Skin:    General: Skin is warm and dry.     Capillary Refill: Capillary refill takes less than 2 seconds.  Neurological:     Mental Status: She is alert and oriented to person, place, and time.  Psychiatric:        Behavior: Behavior normal.      Musculoskeletal Exam: She had good range of motion of the cervical spine without discomfort.  She had good range of motion of the lumbar spine.  She had no tenderness over SI joints.  Shoulders, elbows, wrist joints, MCPs PIPs and DIPs were in good range of motion.  She had bilateral PIP and DIP thickening with no synovitis.  Hip joints and knee joints in good range of motion without any warmth swelling or effusion.  There was no Achilles tendinitis or plantar fasciitis.  There was no tenderness over ankles or MTPs.  Varicose veins were  noted on bilateral lower extremities.  CDAI Exam: CDAI Score: -- Patient Global: --; Provider Global: -- Swollen: --; Tender: -- Joint Exam 07/04/2023   No joint exam has been documented for this visit   There is currently no information documented on the homunculus. Go to the Rheumatology activity and complete the homunculus joint exam.  Investigation: No additional findings.  Imaging: No results found.  Recent Labs: Lab Results  Component Value Date   WBC 8.1 03/26/2023   HGB 14.4 03/26/2023   PLT 231 03/26/2023  NA 140 03/26/2023   K 3.9 03/26/2023   CL 105 03/26/2023   CO2 28 03/26/2023   GLUCOSE 86 03/26/2023   BUN 13 03/26/2023   CREATININE 0.72 03/26/2023   BILITOT 0.7 03/26/2023   ALKPHOS 82 08/07/2016   AST 18 03/26/2023   ALT 23 03/26/2023   PROT 7.1 03/26/2023   ALBUMIN 4.4 08/07/2016   CALCIUM 9.1 03/26/2023   GFRAA 85 07/26/2020   QFTBGOLDPLUS NEGATIVE 09/13/2022    Speciality Comments: Enbrel  discontinued August 2021-not covered by Medicare Cimzia - started 12/22/2019  Procedures:  No procedures performed Allergies: Patient has no known allergies.   Assessment / Plan:     Visit Diagnoses: Psoriatic arthropathy (HCC)-she has not had a flare of psoriatic arthritis since the last visit.  She denies any morning stiffness.  She denies any episodes of joint swelling, dactylitis, Achilles tendinitis, plantar fasciitis or uveitis.  No synovitis or tenosynovitis was noted on the examination today.  She has been tolerating Cimzia .  Psoriasis-she had no active psoriasis lesions.  High risk medication use - Cimzia  400 mg sq injections every 28 days and Methotrexate  4 tablets by mouth once weekly.  She takes folic acid 400 mcg daily over-the-counter. -March 26, 2023 CBC and CMP were normal.  TB Gold was negative on September 13, 2022.  Plan: CBC with Differential/Platelet, Comprehensive metabolic panel with GFR today and every 3 months.  She was advised to do TB  Gold with her next labs.  Information for immunization was placed in the AVS.  She was advised to hold Cimzia  and methotrexate  if she develops an infection and was able to the infection resolves.  Use of sunscreen and sun protection was discussed.  Annual skin examination was advised to screen for skin cancer.  Knee instability, right-she has been feeling some instability in her right knee joint.  I offered x-rays but she declined.  I gave her a handout on knee joint exercises.  A knee joint sleeve brace was also advised.  I advised her to schedule an appointment with the orthopedic surgery if she has persistent symptoms.  DDD (degenerative disc disease), cervical-she had good range of motion without discomfort.  Osteopenia of multiple sites - DEXA updated on 05/25/20: RFN BMD 0.645 with T-score -1.8.  Repeat DEXA scan is pending.  She takes calcium with vitamin D .  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  History of restless legs syndrome  History of esophagitis-currently not symptomatic.  Orders: Orders Placed This Encounter  Procedures   CBC with Differential/Platelet   Comprehensive metabolic panel with GFR   No orders of the defined types were placed in this encounter.   Follow-Up Instructions: Return in about 5 months (around 12/04/2023) for Psoriatic arthritis.   Nicholas Bari, MD  Note - This record has been created using Animal nutritionist.  Chart creation errors have been sought, but may not always  have been located. Such creation errors do not reflect on  the standard of medical care.

## 2023-07-04 ENCOUNTER — Ambulatory Visit: Payer: Medicare Other | Attending: Rheumatology | Admitting: Rheumatology

## 2023-07-04 ENCOUNTER — Encounter: Payer: Self-pay | Admitting: Rheumatology

## 2023-07-04 VITALS — BP 134/89 | HR 73 | Resp 16 | Ht 62.0 in | Wt 179.0 lb

## 2023-07-04 DIAGNOSIS — M8589 Other specified disorders of bone density and structure, multiple sites: Secondary | ICD-10-CM

## 2023-07-04 DIAGNOSIS — Z8719 Personal history of other diseases of the digestive system: Secondary | ICD-10-CM

## 2023-07-04 DIAGNOSIS — L4050 Arthropathic psoriasis, unspecified: Secondary | ICD-10-CM

## 2023-07-04 DIAGNOSIS — M25361 Other instability, right knee: Secondary | ICD-10-CM | POA: Diagnosis present

## 2023-07-04 DIAGNOSIS — Z8669 Personal history of other diseases of the nervous system and sense organs: Secondary | ICD-10-CM

## 2023-07-04 DIAGNOSIS — Z79899 Other long term (current) drug therapy: Secondary | ICD-10-CM

## 2023-07-04 DIAGNOSIS — L409 Psoriasis, unspecified: Secondary | ICD-10-CM

## 2023-07-04 DIAGNOSIS — M5030 Other cervical disc degeneration, unspecified cervical region: Secondary | ICD-10-CM | POA: Diagnosis present

## 2023-07-04 DIAGNOSIS — F439 Reaction to severe stress, unspecified: Secondary | ICD-10-CM | POA: Diagnosis present

## 2023-07-04 MED ORDER — CERTOLIZUMAB PEGOL 2 X 200 MG ~~LOC~~ KIT
400.0000 mg | PACK | Freq: Once | SUBCUTANEOUS | Status: AC
  Administered 2023-07-04: 400 mg via SUBCUTANEOUS

## 2023-07-04 NOTE — Progress Notes (Signed)
 Subjective:   Patient presents to clinic today to receive monthly dose of Cimzia .  Patient running a fever or have signs/symptoms of infection? No  Patient currently on antibiotics for the treatment of infection? No  Patient have any upcoming invasive procedures/surgeries? No  Objective: CMP     Component Value Date/Time   NA 140 03/26/2023 1359   K 3.9 03/26/2023 1359   CL 105 03/26/2023 1359   CO2 28 03/26/2023 1359   GLUCOSE 86 03/26/2023 1359   BUN 13 03/26/2023 1359   CREATININE 0.72 03/26/2023 1359   CALCIUM 9.1 03/26/2023 1359   PROT 7.1 03/26/2023 1359   ALBUMIN 4.4 08/07/2016 0911   AST 18 03/26/2023 1359   ALT 23 03/26/2023 1359   ALKPHOS 82 08/07/2016 0911   BILITOT 0.7 03/26/2023 1359   GFRNONAA 73 07/26/2020 1421   GFRAA 85 07/26/2020 1421    CBC    Component Value Date/Time   WBC 8.1 03/26/2023 1359   RBC 4.96 03/26/2023 1359   HGB 14.4 03/26/2023 1359   HGB 14.4 11/25/2013 0943   HCT 42.6 03/26/2023 1359   PLT 231 03/26/2023 1359   MCV 85.9 03/26/2023 1359   MCH 29.0 03/26/2023 1359   MCHC 33.8 03/26/2023 1359   RDW 13.4 03/26/2023 1359   LYMPHSABS 2,915 09/13/2022 1416   MONOABS 560 08/07/2016 0911   EOSABS 138 03/26/2023 1359   BASOSABS 49 03/26/2023 1359    Baseline Immunosuppressant Therapy Labs TB GOLD    Latest Ref Rng & Units 09/13/2022    2:16 PM  Quantiferon TB Gold  Quantiferon TB Gold Plus NEGATIVE NEGATIVE    Hepatitis Panel    Latest Ref Rng & Units 08/07/2016    9:11 AM  Hepatitis  Hep B Surface Ag NEGATIVE NEGATIVE   Hep B IgM NON REACTIVE NON REACTIVE   Hep C Ab NEGATIVE NEGATIVE    HIV Lab Results  Component Value Date   HIV NONREACTIVE 08/07/2016   Immunoglobulins    Latest Ref Rng & Units 08/07/2016    9:11 AM  Immunoglobulin Electrophoresis  IgG 694 - 1,618 mg/dL 1,478   IgM 48 - 295 mg/dL 621    SPEP    Latest Ref Rng & Units 03/26/2023    1:59 PM  Serum Protein Electrophoresis  Total Protein 6.1 - 8.1  g/dL 7.1    H0QM No results found for: "G6PDH" TPMT No results found for: "TPMT"   Chest x-ray: 02/03/2011 No active disease   Assessment/Plan:   Administrations This Visit     certolizumab pegol  (CIMZIA ) kit 400 mg     Admin Date 07/04/2023 Action Given Dose 400 mg Route Subcutaneous Documented By Adrianne Horn, LPN             Patient tolerated injection well.   Appointment for next injection scheduled for 08/02/2023.  Patient due for labs today and drawn in office.  Patient is to call and reschedule appointment if running a fever with signs/symptoms of infection, on antibiotics for active infection or has an upcoming invasive procedure.  All questions encouraged and answered.  Instructed patient to call with any further questions or concerns.

## 2023-07-04 NOTE — Patient Instructions (Addendum)
 Standing Labs We placed an order today for your standing lab work.   Please have your standing labs drawn in August and every 3 months  Please have your labs drawn 2 weeks prior to your appointment so that the provider can discuss your lab results at your appointment, if possible.  Please note that you may see your imaging and lab results in MyChart before we have reviewed them. We will contact you once all results are reviewed. Please allow our office up to 72 hours to thoroughly review all of the results before contacting the office for clarification of your results.  WALK-IN LAB HOURS  Monday through Thursday from 8:00 am -12:30 pm and 1:00 pm-4:00 pm and Friday from 8:00 am-12:00 pm.  Patients with office visits requiring labs will be seen before walk-in labs.  You may encounter longer than normal wait times. Please allow additional time. Wait times may be shorter on  Monday and Thursday afternoons.  We do not book appointments for walk-in labs. We appreciate your patience and understanding with our staff.   Labs are drawn by [facility]. Please bring your co-pay at the time of your lab draw.  You may receive a bill from [facility] for your lab work.  Please note if you are on Hydroxychloroquine and and an order has been placed for a Hydroxychloroquine level,  you will need to have it drawn 4 hours or more after your last dose.  If you wish to have your labs drawn at another location, please call the office 24 hours in advance so we can fax the orders.  The office is located at [address], [address], [place], [place] [address]   If you have any questions regarding directions or hours of operation,  please call [phone].   As a reminder, please drink plenty of water prior to coming for your lab work. Thanks!   Vaccines You are taking a medication(s) that can suppress your immune system.  The following immunizations are recommended: Flu annually Covid-19  RSV Td/Tdap (tetanus,  diphtheria, pertussis) every 10 years Pneumonia (Prevnar 15 then Pneumovax 23 at least 1 year apart.  Alternatively, can take Prevnar 20 without needing additional dose) Shingrix: 2 doses from 4 weeks to 6 months apart  Please check with your PCP to make sure you are up to date.   If you have signs or symptoms of an infection or start antibiotics: First, call your PCP for workup of your infection. Hold your medication through the infection, until you complete your antibiotics, and until symptoms resolve if you take the following: Injectable medication (Actemra, Benlysta, Cimzia , Cosentyx, Enbrel , Humira, Kevzara, Orencia, Remicade, Simponi, Stelara, Taltz, Tremfya) Methotrexate  Leflunomide (Arava) Mycophenolate (Cellcept) Cloria Danger, Olumiant, or Rinvoq  Please get an annual skin semination to screen for skin cancer while you are on Cimzia .  Please use sunscreen and sun protection.   Exercises for Chronic Knee Pain Chronic knee pain is pain that lasts longer than 3 months. For most people with chronic knee pain, exercise and weight loss is an important part of treatment. Your health care provider may want you to focus on: Making the muscles that support your knee stronger. This can take pressure off your knee and reduce pain. Preventing knee stiffness. How far you can move your knee, keeping it there or making it farther. Losing weight (if this applies) to take pressure off your knee, lower your risk for injury, and make it easier for you to exercise. Your provider will help you  make an exercise program that fits your needs and physical abilities. Below are simple, low-impact exercises you can do at home. Ask your provider or physical therapist how often you should do your exercise program and how many times to repeat each exercise. General safety tips  Get your provider's approval before doing any exercises. Start slowly and stop any time you feel pain. Do not exercise if your knee pain is  flaring up. Warm up first. Stretching a cold muscle can cause an injury. Do 5-10 minutes of easy movement or light stretching before beginning your exercises. Do 5-10 minutes of low-impact activity (like walking or cycling) before starting strengthening exercises. Contact your provider any time you have pain during or after exercising. Exercise can cause discomfort but should not be painful. It is normal to be a little stiff or sore after exercising. Stretching and range-of-motion exercises Front thigh stretch  Stand up straight and support your body by holding on to a chair or resting one hand on a wall. With your legs straight and close together, bend one knee to lift your heel up toward your butt. Using one hand for support, grab your ankle with your free hand. Pull your foot up closer toward your butt to feel the stretch in front of your thigh. Hold the stretch for 30 seconds. Repeat __________ times. Complete this exercise __________ times a day. Back thigh stretch  Sit on the floor with your back straight and your legs out straight in front of you. Place the palms of your hands on the floor and slide them toward your feet as you bend at the hip. Try to touch your nose to your knees and feel the stretch in the back of your thighs. Hold for 30 seconds. Repeat __________ times. Complete this exercise __________ times a day. Calf stretch  Stand facing a wall. Place the palms of your hands flat against the wall, arms extended, and lean slightly against the wall. Get into a lunge position with one leg bent at the knee and the other leg stretched out straight behind you. Keep both feet facing the wall and increase the bend in your knee while keeping the heel of the other leg flat on the ground. You should feel the stretch in your calf. Hold for 30 seconds. Repeat __________ times. Complete this exercise __________ times a day. Strengthening exercises Straight leg lift  Lie on your back  with one knee bent and the other leg out straight. Slowly lift the straight leg without bending the knee. Lift until your foot is about 12 inches (30 cm) off the floor. Hold for 3-5 seconds and slowly lower your leg. Repeat __________ times. Complete this exercise __________ times a day. Single leg dip  Stand between two chairs and put both hands on the backs of the chairs for support. Extend one leg out straight with your body weight resting on the heel of the standing leg. Slowly bend your standing knee to dip your body to the level that is comfortable for you. Hold for 3-5 seconds. Repeat __________ times. Complete this exercise __________ times a day. Hamstring curls  Stand straight, knees close together, facing the back of a chair. Hold on to the back of a chair with both hands. Keep one leg straight. Bend the other knee while bringing the heel up toward the butt until the knee is bent at a 90-degree angle (right angle). Hold for 3-5 seconds. Repeat __________ times. Complete this exercise __________ times a day.  Wall squat  Stand straight with your back, hips, and head against a wall. Step forward one foot at a time with your back still against the wall. Your feet should be 2 feet (61 cm) from the wall at shoulder width. Keeping your back, hips, and head against the wall, slide down the wall to as close to a sitting position as you can get. Hold for 5-10 seconds, then slowly slide back up. Repeat __________ times. Complete this exercise __________ times a day. Step-ups  Stand in front of a sturdy platform or stool that is about 6 inches (15 cm) high. Slowly step up with your left / right foot, keeping your knee in line with your hip and foot. Do not let your knee bend so far that you cannot see your toes. Hold on to a chair for balance, but do not use it for support. Slowly unlock your knee and lower yourself to the starting position. Repeat __________ times. Complete this  exercise __________ times a day. Contact a health care provider if: Your exercises cause pain. Your pain is worse after you exercise. Your pain prevents you from doing your exercises. This information is not intended to replace advice given to you by your health care provider. Make sure you discuss any questions you have with your health care provider. Document Revised: 02/07/2022 Document Reviewed: 02/07/2022 Elsevier Patient Education  2024 [employer].

## 2023-07-05 ENCOUNTER — Ambulatory Visit: Payer: Self-pay | Admitting: Rheumatology

## 2023-07-05 LAB — COMPREHENSIVE METABOLIC PANEL WITH GFR
AG Ratio: 1.8 (calc) (ref 1.0–2.5)
ALT: 25 U/L (ref 6–29)
AST: 22 U/L (ref 10–35)
Albumin: 4.8 g/dL (ref 3.6–5.1)
Alkaline phosphatase (APISO): 71 U/L (ref 37–153)
BUN: 15 mg/dL (ref 7–25)
CO2: 25 mmol/L (ref 20–32)
Calcium: 10 mg/dL (ref 8.6–10.4)
Chloride: 104 mmol/L (ref 98–110)
Creat: 0.83 mg/dL (ref 0.50–1.05)
Globulin: 2.6 g/dL (ref 1.9–3.7)
Glucose, Bld: 81 mg/dL (ref 65–99)
Potassium: 4.6 mmol/L (ref 3.5–5.3)
Sodium: 141 mmol/L (ref 135–146)
Total Bilirubin: 0.6 mg/dL (ref 0.2–1.2)
Total Protein: 7.4 g/dL (ref 6.1–8.1)
eGFR: 77 mL/min/{1.73_m2} (ref >=60)

## 2023-07-05 LAB — CBC WITH DIFFERENTIAL/PLATELET
Absolute Lymphocytes: 2841 {cells}/uL (ref 850–3900)
Absolute Monocytes: 562 {cells}/uL (ref 200–950)
Basophils Absolute: 31 {cells}/uL (ref 0–200)
Basophils Relative: 0.4 %
Eosinophils Absolute: 146 {cells}/uL (ref 15–500)
Eosinophils Relative: 1.9 %
HCT: 46.4 % — ABNORMAL HIGH (ref 35.0–45.0)
Hemoglobin: 15.1 g/dL (ref 11.7–15.5)
MCH: 28.5 pg (ref 27.0–33.0)
MCHC: 32.5 g/dL (ref 32.0–36.0)
MCV: 87.7 fL (ref 80.0–100.0)
MPV: 11.3 fL (ref 7.5–12.5)
Monocytes Relative: 7.3 %
Neutro Abs: 4120 {cells}/uL (ref 1500–7800)
Neutrophils Relative %: 53.5 %
Platelets: 211 10*3/uL (ref 140–400)
RBC: 5.29 10*6/uL — ABNORMAL HIGH (ref 3.80–5.10)
RDW: 13.8 % (ref 11.0–15.0)
Total Lymphocyte: 36.9 %
WBC: 7.7 10*3/uL (ref 3.8–10.8)

## 2023-07-05 NOTE — Progress Notes (Signed)
 CBC and CMP are stable.

## 2023-08-01 ENCOUNTER — Ambulatory Visit: Attending: Rheumatology

## 2023-08-01 VITALS — BP 125/75 | HR 71

## 2023-08-01 DIAGNOSIS — L4050 Arthropathic psoriasis, unspecified: Secondary | ICD-10-CM | POA: Diagnosis present

## 2023-08-01 MED ORDER — CERTOLIZUMAB PEGOL 2 X 200 MG ~~LOC~~ KIT
400.0000 mg | PACK | Freq: Once | SUBCUTANEOUS | Status: AC
  Administered 2023-08-01: 400 mg via SUBCUTANEOUS

## 2023-08-01 NOTE — Progress Notes (Signed)
 Subjective:   Patient presents to clinic today to receive monthly dose of Cimzia .  Patient running a fever or have signs/symptoms of infection? No  Patient currently on antibiotics for the treatment of infection? No  Patient have any upcoming invasive procedures/surgeries? No  Objective: CMP     Component Value Date/Time   NA 141 07/04/2023 1033   K 4.6 07/04/2023 1033   CL 104 07/04/2023 1033   CO2 25 07/04/2023 1033   GLUCOSE 81 07/04/2023 1033   BUN 15 07/04/2023 1033   CREATININE 0.83 07/04/2023 1033   CALCIUM 10.0 07/04/2023 1033   PROT 7.4 07/04/2023 1033   ALBUMIN 4.4 08/07/2016 0911   AST 22 07/04/2023 1033   ALT 25 07/04/2023 1033   ALKPHOS 82 08/07/2016 0911   BILITOT 0.6 07/04/2023 1033   GFRNONAA 73 07/26/2020 1421   GFRAA 85 07/26/2020 1421    CBC    Component Value Date/Time   WBC 7.7 07/04/2023 1033   RBC 5.29 (H) 07/04/2023 1033   HGB 15.1 07/04/2023 1033   HGB 14.4 11/25/2013 0943   HCT 46.4 (H) 07/04/2023 1033   PLT 211 07/04/2023 1033   MCV 87.7 07/04/2023 1033   MCH 28.5 07/04/2023 1033   MCHC 32.5 07/04/2023 1033   RDW 13.8 07/04/2023 1033   LYMPHSABS 2,915 09/13/2022 1416   MONOABS 560 08/07/2016 0911   EOSABS 146 07/04/2023 1033   BASOSABS 31 07/04/2023 1033    Baseline Immunosuppressant Therapy Labs TB GOLD    Latest Ref Rng & Units 09/13/2022    2:16 PM  Quantiferon TB Gold  Quantiferon TB Gold Plus NEGATIVE NEGATIVE    Hepatitis Panel    Latest Ref Rng & Units 08/07/2016    9:11 AM  Hepatitis  Hep B Surface Ag NEGATIVE NEGATIVE   Hep B IgM NON REACTIVE NON REACTIVE   Hep C Ab NEGATIVE NEGATIVE    HIV Lab Results  Component Value Date   HIV NONREACTIVE 08/07/2016   Immunoglobulins    Latest Ref Rng & Units 08/07/2016    9:11 AM  Immunoglobulin Electrophoresis  IgG 694 - 1,618 mg/dL 8,885   IgM 48 - 728 mg/dL 848    SPEP    Latest Ref Rng & Units 07/04/2023   10:33 AM  Serum Protein Electrophoresis  Total Protein  6.1 - 8.1 g/dL 7.4    H3EI No results found for: G6PDH TPMT No results found for: TPMT   Chest x-ray: 02/03/2011 No active disease   Assessment/Plan:  Administrations This Visit     certolizumab pegol  (CIMZIA ) kit 400 mg     Admin Date 08/01/2023 Action Given Dose 400 mg Route Subcutaneous Documented By Burl Francina HERO, CMA             Patient tolerated injection well.   Appointment for next injection scheduled for 08/29/2023.  Patient due for labs in August.  Patient is to call and reschedule appointment if running a fever with signs/symptoms of infection, on antibiotics for active infection or has an upcoming invasive procedure.  All questions encouraged and answered.  Instructed patient to call with any further questions or concerns.

## 2023-08-04 ENCOUNTER — Other Ambulatory Visit: Payer: Self-pay | Admitting: Physician Assistant

## 2023-08-04 DIAGNOSIS — L4050 Arthropathic psoriasis, unspecified: Secondary | ICD-10-CM

## 2023-08-06 NOTE — Telephone Encounter (Signed)
 Last Fill: 01/17/2023  Labs: 07/04/2023 CBC and CMP are stable.   Next Visit: 12/04/2023  Last Visit: 07/04/2023  DX: Psoriatic arthropathy   Current Dose per office note 07/04/2023: Methotrexate  4 tablets by mouth once weekly   Okay to refill Methotrexate ?

## 2023-09-06 ENCOUNTER — Ambulatory Visit: Attending: Rheumatology | Admitting: *Deleted

## 2023-09-06 VITALS — BP 131/82 | HR 81

## 2023-09-06 DIAGNOSIS — L4050 Arthropathic psoriasis, unspecified: Secondary | ICD-10-CM | POA: Insufficient documentation

## 2023-09-06 MED ORDER — CERTOLIZUMAB PEGOL 2 X 200 MG ~~LOC~~ KIT
400.0000 mg | PACK | Freq: Once | SUBCUTANEOUS | Status: AC
  Administered 2023-09-06: 400 mg via SUBCUTANEOUS

## 2023-09-06 NOTE — Progress Notes (Signed)
 Subjective:   Patient presents to clinic today to receive monthly dose of Cimzia .  Patient running a fever or have signs/symptoms of infection? No  Patient currently on antibiotics for the treatment of infection? No  Patient have any upcoming invasive procedures/surgeries? No  Objective: CMP     Component Value Date/Time   NA 141 07/04/2023 1033   K 4.6 07/04/2023 1033   CL 104 07/04/2023 1033   CO2 25 07/04/2023 1033   GLUCOSE 81 07/04/2023 1033   BUN 15 07/04/2023 1033   CREATININE 0.83 07/04/2023 1033   CALCIUM 10.0 07/04/2023 1033   PROT 7.4 07/04/2023 1033   ALBUMIN 4.4 08/07/2016 0911   AST 22 07/04/2023 1033   ALT 25 07/04/2023 1033   ALKPHOS 82 08/07/2016 0911   BILITOT 0.6 07/04/2023 1033   GFRNONAA 73 07/26/2020 1421   GFRAA 85 07/26/2020 1421    CBC    Component Value Date/Time   WBC 7.7 07/04/2023 1033   RBC 5.29 (H) 07/04/2023 1033   HGB 15.1 07/04/2023 1033   HGB 14.4 11/25/2013 0943   HCT 46.4 (H) 07/04/2023 1033   PLT 211 07/04/2023 1033   MCV 87.7 07/04/2023 1033   MCH 28.5 07/04/2023 1033   MCHC 32.5 07/04/2023 1033   RDW 13.8 07/04/2023 1033   LYMPHSABS 2,915 09/13/2022 1416   MONOABS 560 08/07/2016 0911   EOSABS 146 07/04/2023 1033   BASOSABS 31 07/04/2023 1033    Baseline Immunosuppressant Therapy Labs TB GOLD    Latest Ref Rng & Units 09/13/2022    2:16 PM  Quantiferon TB Gold  Quantiferon TB Gold Plus NEGATIVE NEGATIVE    Hepatitis Panel    Latest Ref Rng & Units 08/07/2016    9:11 AM  Hepatitis  Hep B Surface Ag NEGATIVE NEGATIVE   Hep B IgM NON REACTIVE NON REACTIVE   Hep C Ab NEGATIVE NEGATIVE    HIV Lab Results  Component Value Date   HIV NONREACTIVE 08/07/2016   Immunoglobulins    Latest Ref Rng & Units 08/07/2016    9:11 AM  Immunoglobulin Electrophoresis  IgG 694 - 1,618 mg/dL 8,885   IgM 48 - 728 mg/dL 848    SPEP    Latest Ref Rng & Units 07/04/2023   10:33 AM  Serum Protein Electrophoresis  Total Protein  6.1 - 8.1 g/dL 7.4    H3EI No results found for: G6PDH TPMT No results found for: TPMT   Chest x-ray: 02/03/2011 No active disease   Assessment/Plan:   Administrations This Visit     certolizumab pegol  (CIMZIA ) kit 400 mg     Admin Date 09/06/2023 Action Given Dose 400 mg Route Subcutaneous Documented By Cena Alfonso CROME, LPN             Patient tolerated injection well.   Appointment for next injection scheduled for 09/03/2023.  Patient due for labs in August 2025.  Patient is to call and reschedule appointment if running a fever with signs/symptoms of infection, on antibiotics for active infection or has an upcoming invasive procedure.  All questions encouraged and answered.  Instructed patient to call with any further questions or concerns.

## 2023-09-16 IMAGING — US US BREAST*R* LIMITED INC AXILLA
1 series · 5 of 5 positions shown · non-contrast
Comparison: Previous exam(s).

CLINICAL DATA: Recall from screening mammography, mass involving
the inner RIGHT breast at middle depth.

EXAM:
ULTRASOUND OF THE RIGHT BREAST

[Series 1: us breast*right* limited inc axilla · 0.06mm/px · 5 of 5 slices shown]
[im 1/5]
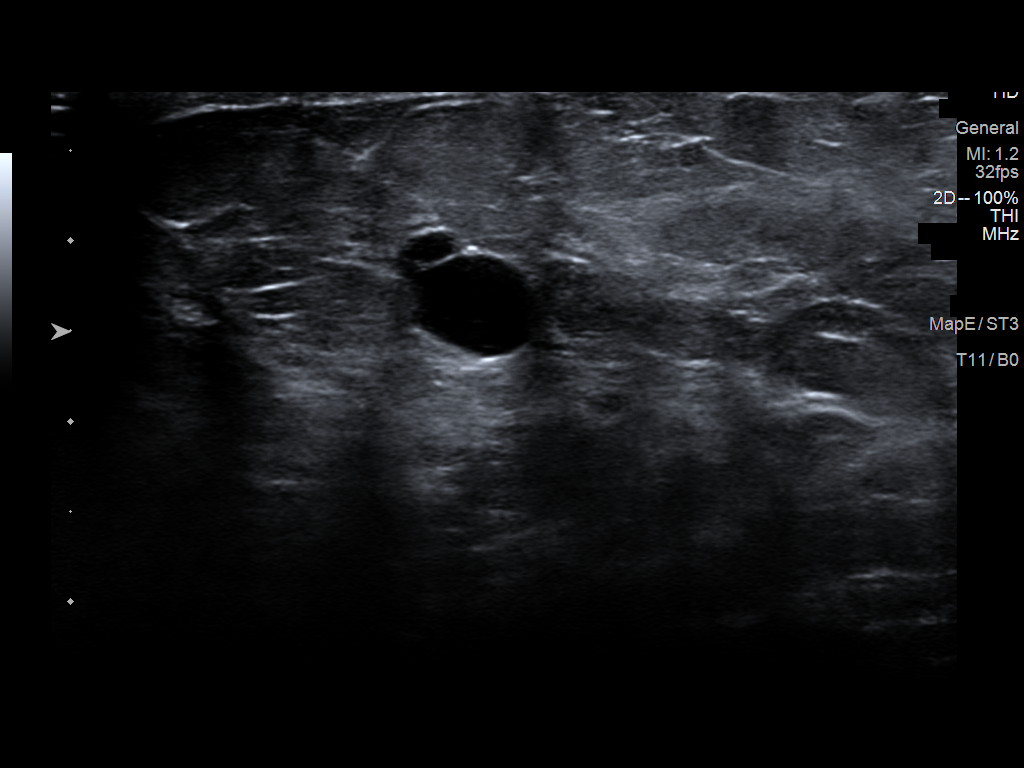
[im 2/5]
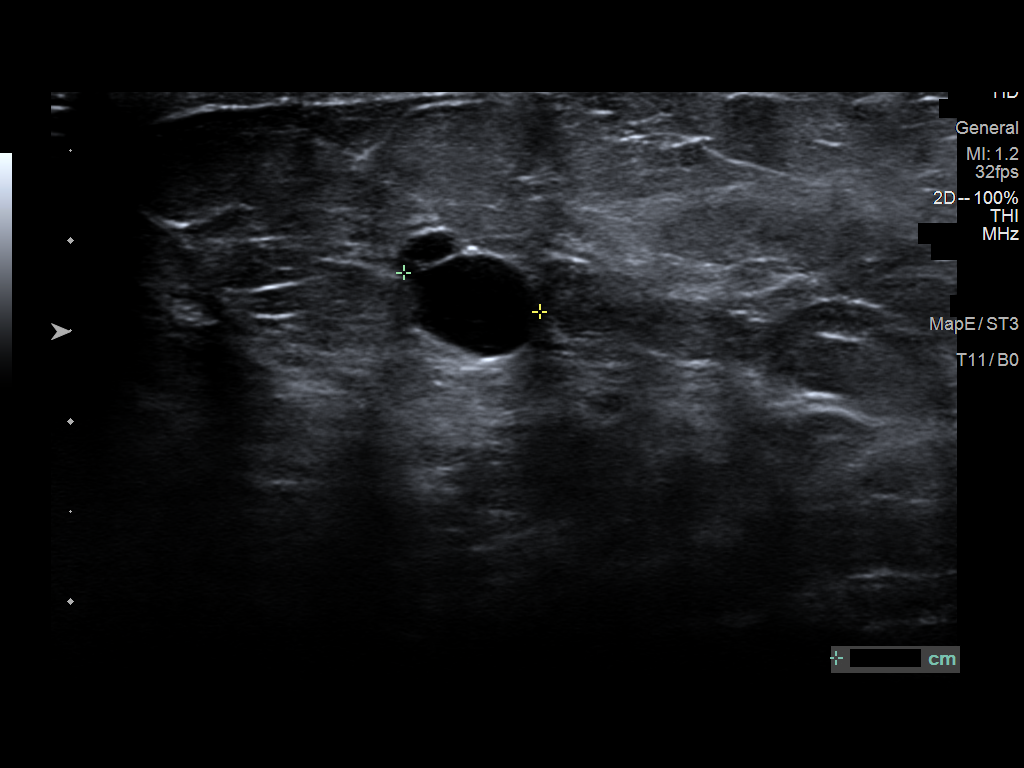
[im 3/5]
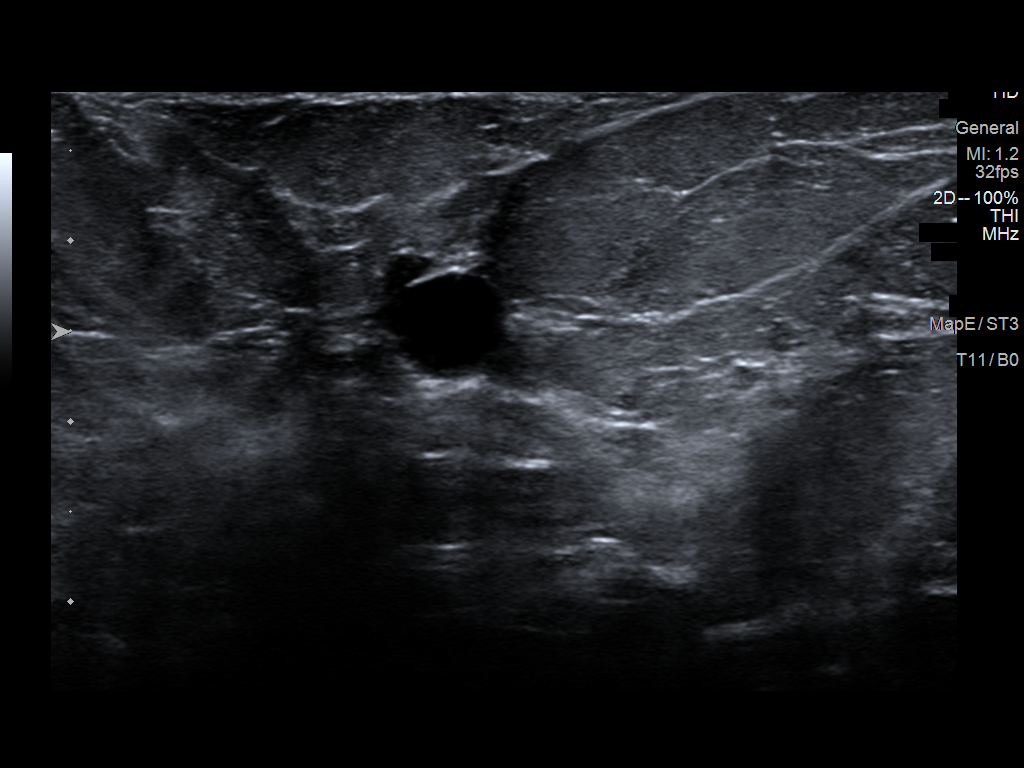
[im 4/5]
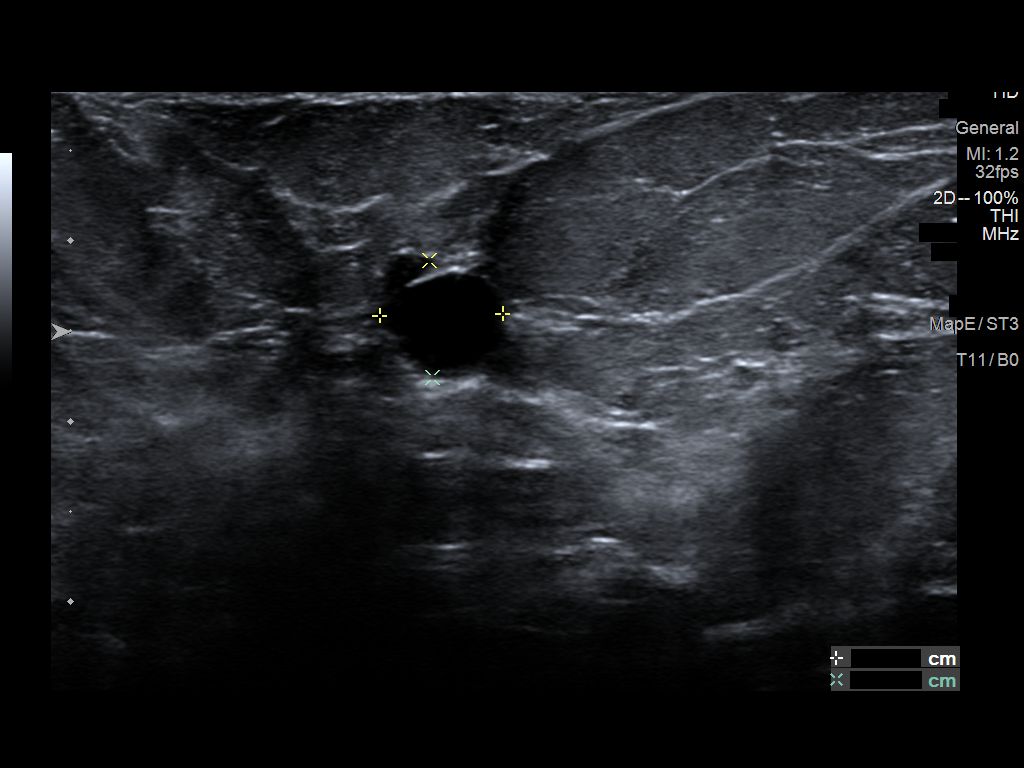
[im 5/5]
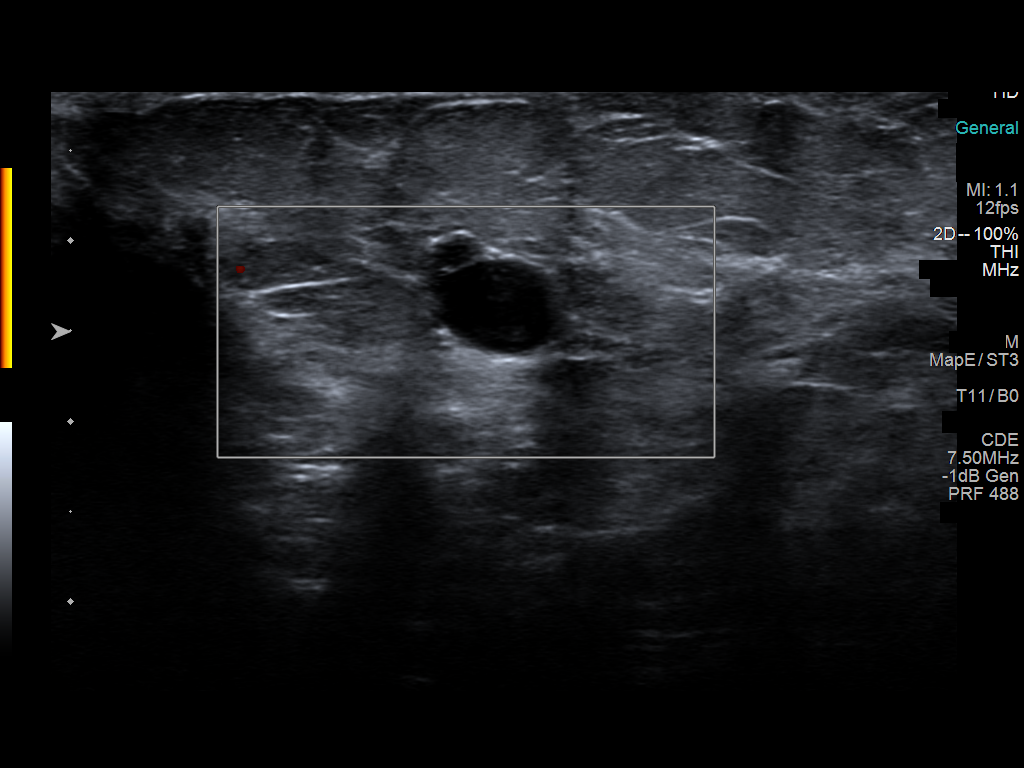

[5 of 5 positions shown; findings below may reference images not displayed]

FINDINGS: Targeted ultrasound is performed, showing an oval parallel
circumscribed anechoic mass with a thin internal septation at the 3
o'clock position 2 cm from the nipple at middle depth, measuring
approximately 0.9 x 0.7 x 0.7 cm, demonstrating posterior acoustic
enhancement and no internal power Doppler flow, corresponding to the
screening mammographic finding. No suspicious solid mass or abnormal
acoustic shadowing is identified.
IMPRESSION: 1. No sonographic evidence of malignancy involving the RIGHT breast.
2. Benign 0.9 cm cyst in the inner breast which accounts for the
screening mammographic finding.

RECOMMENDATION:
Screening mammogram in one year.(Code:[id number])

I have discussed the findings and recommendations with the patient.
If applicable, a reminder letter will be sent to the patient
regarding the next appointment.

BI-RADS CATEGORY  2: Benign.

## 2023-10-03 ENCOUNTER — Other Ambulatory Visit: Payer: Self-pay | Admitting: *Deleted

## 2023-10-03 DIAGNOSIS — Z9225 Personal history of immunosupression therapy: Secondary | ICD-10-CM

## 2023-10-03 DIAGNOSIS — Z111 Encounter for screening for respiratory tuberculosis: Secondary | ICD-10-CM

## 2023-10-03 DIAGNOSIS — Z79899 Other long term (current) drug therapy: Secondary | ICD-10-CM

## 2023-10-04 ENCOUNTER — Ambulatory Visit: Attending: Rheumatology

## 2023-10-04 VITALS — BP 126/85 | HR 98

## 2023-10-04 DIAGNOSIS — L4050 Arthropathic psoriasis, unspecified: Secondary | ICD-10-CM | POA: Diagnosis present

## 2023-10-04 MED ORDER — CERTOLIZUMAB PEGOL 2 X 200 MG ~~LOC~~ KIT
400.0000 mg | PACK | Freq: Once | SUBCUTANEOUS | Status: AC
  Administered 2023-10-04: 400 mg via SUBCUTANEOUS

## 2023-10-04 NOTE — Progress Notes (Signed)
 Pharmacy Note  Subjective:   Patient presents to clinic today to receive monthly dose of Cimzia .  Patient running a fever or have signs/symptoms of infection? No  Patient currently on antibiotics for the treatment of infection? No  Patient have any upcoming invasive procedures/surgeries? No  Objective: CMP     Component Value Date/Time   NA 141 07/04/2023 1033   K 4.6 07/04/2023 1033   CL 104 07/04/2023 1033   CO2 25 07/04/2023 1033   GLUCOSE 81 07/04/2023 1033   BUN 15 07/04/2023 1033   CREATININE 0.83 07/04/2023 1033   CALCIUM 10.0 07/04/2023 1033   PROT 7.4 07/04/2023 1033   ALBUMIN 4.4 08/07/2016 0911   AST 22 07/04/2023 1033   ALT 25 07/04/2023 1033   ALKPHOS 82 08/07/2016 0911   BILITOT 0.6 07/04/2023 1033   GFRNONAA 73 07/26/2020 1421   GFRAA 85 07/26/2020 1421    CBC    Component Value Date/Time   WBC 7.7 07/04/2023 1033   RBC 5.29 (H) 07/04/2023 1033   HGB 15.1 07/04/2023 1033   HGB 14.4 11/25/2013 0943   HCT 46.4 (H) 07/04/2023 1033   PLT 211 07/04/2023 1033   MCV 87.7 07/04/2023 1033   MCH 28.5 07/04/2023 1033   MCHC 32.5 07/04/2023 1033   RDW 13.8 07/04/2023 1033   LYMPHSABS 2,915 09/13/2022 1416   MONOABS 560 08/07/2016 0911   EOSABS 146 07/04/2023 1033   BASOSABS 31 07/04/2023 1033    Baseline Immunosuppressant Therapy Labs TB GOLD    Latest Ref Rng & Units 09/13/2022    2:16 PM  Quantiferon TB Gold  Quantiferon TB Gold Plus NEGATIVE NEGATIVE    Hepatitis Panel    Latest Ref Rng & Units 08/07/2016    9:11 AM  Hepatitis  Hep B Surface Ag NEGATIVE NEGATIVE   Hep B IgM NON REACTIVE NON REACTIVE   Hep C Ab NEGATIVE NEGATIVE    HIV Lab Results  Component Value Date   HIV NONREACTIVE 08/07/2016   Immunoglobulins    Latest Ref Rng & Units 08/07/2016    9:11 AM  Immunoglobulin Electrophoresis  IgG 694 - 1,618 mg/dL 8,885   IgM 48 - 728 mg/dL 848    SPEP    Latest Ref Rng & Units 07/04/2023   10:33 AM  Serum Protein Electrophoresis   Total Protein 6.1 - 8.1 g/dL 7.4    H3EI No results found for: G6PDH TPMT No results found for: TPMT   Chest x-ray: 02/03/2011 No active disease   Assessment/Plan:  Administrations This Visit     certolizumab pegol  (CIMZIA ) kit 400 mg     Admin Date 10/04/2023 Action Given Dose 400 mg Route Subcutaneous Documented By Burl Francina HERO, CMA            Patient tolerated injection well.   Appointment for next injection scheduled for 11/01/2023.  Patient due for labs in today but no lab tech in office today patient advised to come back on 10/09/2023.  Patient is to call and reschedule appointment if running a fever with signs/symptoms of infection, on antibiotics for active infection or has an upcoming invasive procedure.  All questions encouraged and answered.  Instructed patient to call with any further questions or concerns.

## 2023-10-10 ENCOUNTER — Other Ambulatory Visit: Payer: Self-pay

## 2023-10-10 DIAGNOSIS — Z111 Encounter for screening for respiratory tuberculosis: Secondary | ICD-10-CM

## 2023-10-10 DIAGNOSIS — Z79899 Other long term (current) drug therapy: Secondary | ICD-10-CM

## 2023-10-10 DIAGNOSIS — Z9225 Personal history of immunosupression therapy: Secondary | ICD-10-CM

## 2023-10-11 ENCOUNTER — Ambulatory Visit: Payer: Self-pay | Admitting: Rheumatology

## 2023-10-11 NOTE — Progress Notes (Signed)
 CBC and CMP normal

## 2023-10-12 LAB — QUANTIFERON-TB GOLD PLUS
Mitogen-NIL: 8.21 [IU]/mL
NIL: 0.02 [IU]/mL
QuantiFERON-TB Gold Plus: NEGATIVE
TB1-NIL: 0 [IU]/mL
TB2-NIL: 0 [IU]/mL

## 2023-10-12 LAB — CBC WITH DIFFERENTIAL/PLATELET
Absolute Lymphocytes: 3263 {cells}/uL (ref 850–3900)
Absolute Monocytes: 664 {cells}/uL (ref 200–950)
Basophils Absolute: 63 {cells}/uL (ref 0–200)
Basophils Relative: 0.8 %
Eosinophils Absolute: 103 {cells}/uL (ref 15–500)
Eosinophils Relative: 1.3 %
HCT: 41.9 % (ref 35.0–45.0)
Hemoglobin: 14.2 g/dL (ref 11.7–15.5)
MCH: 29.6 pg (ref 27.0–33.0)
MCHC: 33.9 g/dL (ref 32.0–36.0)
MCV: 87.5 fL (ref 80.0–100.0)
MPV: 11.2 fL (ref 7.5–12.5)
Monocytes Relative: 8.4 %
Neutro Abs: 3808 {cells}/uL (ref 1500–7800)
Neutrophils Relative %: 48.2 %
Platelets: 225 Thousand/uL (ref 140–400)
RBC: 4.79 Million/uL (ref 3.80–5.10)
RDW: 14 % (ref 11.0–15.0)
Total Lymphocyte: 41.3 %
WBC: 7.9 Thousand/uL (ref 3.8–10.8)

## 2023-10-12 LAB — COMPREHENSIVE METABOLIC PANEL WITH GFR
AG Ratio: 1.7 (calc) (ref 1.0–2.5)
ALT: 16 U/L (ref 6–29)
AST: 16 U/L (ref 10–35)
Albumin: 4.3 g/dL (ref 3.6–5.1)
Alkaline phosphatase (APISO): 67 U/L (ref 37–153)
BUN: 17 mg/dL (ref 7–25)
CO2: 29 mmol/L (ref 20–32)
Calcium: 9.6 mg/dL (ref 8.6–10.4)
Chloride: 105 mmol/L (ref 98–110)
Creat: 0.89 mg/dL (ref 0.50–1.05)
Globulin: 2.5 g/dL (ref 1.9–3.7)
Glucose, Bld: 76 mg/dL (ref 65–99)
Potassium: 3.9 mmol/L (ref 3.5–5.3)
Sodium: 142 mmol/L (ref 135–146)
Total Bilirubin: 0.5 mg/dL (ref 0.2–1.2)
Total Protein: 6.8 g/dL (ref 6.1–8.1)
eGFR: 71 mL/min/1.73m2 (ref >=60)

## 2023-10-14 NOTE — Progress Notes (Signed)
CBC and CMP are normal.  TB Gold is negative.

## 2023-11-01 ENCOUNTER — Ambulatory Visit: Attending: Rheumatology | Admitting: *Deleted

## 2023-11-01 VITALS — BP 125/77 | HR 81

## 2023-11-01 DIAGNOSIS — Z79899 Other long term (current) drug therapy: Secondary | ICD-10-CM | POA: Diagnosis present

## 2023-11-01 MED ORDER — CERTOLIZUMAB PEGOL 2 X 200 MG ~~LOC~~ KIT
400.0000 mg | PACK | Freq: Once | SUBCUTANEOUS | Status: AC
  Administered 2023-11-01: 400 mg via SUBCUTANEOUS

## 2023-11-01 NOTE — Progress Notes (Signed)
 Subjective:   Patient presents to clinic today to receive monthly dose of Cimzia .  Patient running a fever or have signs/symptoms of infection? No  Patient currently on antibiotics for the treatment of infection? No  Patient have any upcoming invasive procedures/surgeries? No  Objective: CMP     Component Value Date/Time   NA 142 10/10/2023 1203   K 3.9 10/10/2023 1203   CL 105 10/10/2023 1203   CO2 29 10/10/2023 1203   GLUCOSE 76 10/10/2023 1203   BUN 17 10/10/2023 1203   CREATININE 0.89 10/10/2023 1203   CALCIUM 9.6 10/10/2023 1203   PROT 6.8 10/10/2023 1203   ALBUMIN 4.4 08/07/2016 0911   AST 16 10/10/2023 1203   ALT 16 10/10/2023 1203   ALKPHOS 82 08/07/2016 0911   BILITOT 0.5 10/10/2023 1203   GFRNONAA 73 07/26/2020 1421   GFRAA 85 07/26/2020 1421    CBC    Component Value Date/Time   WBC 7.9 10/10/2023 1203   RBC 4.79 10/10/2023 1203   HGB 14.2 10/10/2023 1203   HGB 14.4 11/25/2013 0943   HCT 41.9 10/10/2023 1203   PLT 225 10/10/2023 1203   MCV 87.5 10/10/2023 1203   MCH 29.6 10/10/2023 1203   MCHC 33.9 10/10/2023 1203   RDW 14.0 10/10/2023 1203   LYMPHSABS 2,915 09/13/2022 1416   MONOABS 560 08/07/2016 0911   EOSABS 103 10/10/2023 1203   BASOSABS 63 10/10/2023 1203    Baseline Immunosuppressant Therapy Labs TB GOLD    Latest Ref Rng & Units 10/10/2023   12:03 PM  Quantiferon TB Gold  Quantiferon TB Gold Plus NEGATIVE NEGATIVE    Hepatitis Panel    Latest Ref Rng & Units 08/07/2016    9:11 AM  Hepatitis  Hep B Surface Ag NEGATIVE NEGATIVE   Hep B IgM NON REACTIVE NON REACTIVE   Hep C Ab NEGATIVE NEGATIVE    HIV Lab Results  Component Value Date   HIV NONREACTIVE 08/07/2016   Immunoglobulins    Latest Ref Rng & Units 08/07/2016    9:11 AM  Immunoglobulin Electrophoresis  IgG 694 - 1,618 mg/dL 8,885   IgM 48 - 728 mg/dL 848    SPEP    Latest Ref Rng & Units 10/10/2023   12:03 PM  Serum Protein Electrophoresis  Total Protein 6.1 - 8.1  g/dL 6.8    H3EI No results found for: G6PDH TPMT No results found for: TPMT   Chest x-ray: 02/03/2011 No active disease   Assessment/Plan:   Administrations This Visit     certolizumab pegol  (CIMZIA ) kit 400 mg     Admin Date 11/01/2023 Action Given Dose 400 mg Route Subcutaneous Documented By Cena Alfonso CROME, LPN             Patient tolerated injection well.   Appointment for next injection scheduled for 11/29/2023.  Patient due for labs in December 2025.  Patient is to call and reschedule appointment if running a fever with signs/symptoms of infection, on antibiotics for active infection or has an upcoming invasive procedure.  All questions encouraged and answered.  Instructed patient to call with any further questions or concerns.

## 2023-11-20 NOTE — Progress Notes (Signed)
 Office Visit Note  Patient: Cynthia Snyder             Date of Birth: Mar 06, 1955           MRN: [id number]             PCP: Jacques Camie Pepper, PA-C Referring: Jacques Camie Pepper SHAUNNA* Visit Date: 12/04/2023 Occupation: Data Unavailable  Subjective:  Medication management  History of Present Illness: Cynthia Snyder is a 68 y.o. female with psoriatic arthritis and psoriasis.  She returns today after her last visit in May 2025.  She has not had a flare of psoriatic arthritis or psoriasis.  She has been on Cimzia  400 mg subcu every 28 days along with methotrexate  4 tablets weekly and folic acid 400 mcg over-the-counter which she has been taking on a regular basis.  She denies any history of plantar fasciitis, Achilles tendinitis or uveitis.  She denies any side effects on the medications.  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    Activities of Daily Living:  Patient reports morning stiffness for 15-20 minutes.   Patient Reports nocturnal pain.  Difficulty dressing/grooming: Denies Difficulty climbing stairs: Reports Difficulty getting out of chair: Denies Difficulty using hands for taps, buttons, cutlery, and/or writing: Denies  Review of Systems  Constitutional:  Positive for fatigue.  HENT:  Positive for mouth dryness. Negative for mouth sores.   Eyes:  Positive for dryness.  Respiratory:  Negative for shortness of breath.   Cardiovascular:  Negative for chest pain and palpitations.  Gastrointestinal:  Negative for blood in stool, constipation and diarrhea.  Endocrine: Positive for increased urination.  Genitourinary:  Positive for involuntary urination.  Musculoskeletal:  Positive for joint pain, joint pain, myalgias, muscle weakness, morning stiffness, muscle tenderness and myalgias. Negative for gait problem and joint swelling.  Skin:  Negative for color change, rash, hair loss and  sensitivity to sunlight.  Allergic/Immunologic: Negative for susceptible to infections.  Neurological:  Negative for dizziness and headaches.  Hematological:  Negative for swollen glands.  Psychiatric/Behavioral:  Negative for depressed mood and sleep disturbance. The patient is not nervous/anxious.     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   Pain of right hip joint 03/26/2017   Psoriatic arthropathy (HCC) 11/07/2016   Psoriasis 08/04/2016   DJD (degenerative joint disease), cervical 08/04/2016   Esophagitis 08/04/2016   Restless leg syndrome 08/04/2016   High risk medication use 08/04/2016   Atrophic vaginitis 01/22/2015    Class: Chronic    Past Medical History:  Diagnosis Date   Abnormal Pap smear    many yrs ago   Endometrial cancer (HCC)    Endometriosis    Esophagus disorder    Psoriatic arthritis (HCC)    Shingles    Shingles     Family History  Problem Relation Age of Onset   Heart disease Mother    Hypertension Mother    Stroke Mother    Diabetes Mother    Osteoporosis Mother    Hypothyroidism Mother    Heart disease Father    COPD Father    Heart disease Brother    Stroke Brother    Cancer Paternal Aunt        ovarian   Colon cancer Maternal Grandmother    Breast cancer Neg Hx  Past Surgical History:  Procedure Laterality Date   ABDOMINAL HYSTERECTOMY     age 23   APPENDECTOMY     BILATERAL SALPINGOOPHORECTOMY Right 11/24/08   lysis of adhesion   CATARACT EXTRACTION, BILATERAL     esophageal stretched     EXPLORATORY LAPAROTOMY     jaw bone graft  2004   KNEE ARTHROPLASTY     KNEE SURGERY     LAPAROSCOPY     age 52   LIPOSUCTION TRUNK  04/06/2016   Dr. Lorretta for body image   LIVER BIOPSY     mild steatosis   NOSE SURGERY     Social History   Tobacco Use   Smoking status: Never    Passive exposure: Past   Smokeless tobacco: Never  Vaping Use    Vaping status: Never Used  Substance Use Topics   Alcohol use: Yes    Comment: wine occ   Drug use: No   Social History   Social History Narrative   Not on file     Immunization History  Administered Date(s) Administered   Influenza Inj Mdck Quad Pf 10/23/2017   Influenza Split 10/20/2017   Influenza, Seasonal, Injecte, Preservative Fre 10/29/2013, 12/08/2014, 11/18/2015   Moderna Sars-Covid-2 Vaccination 04/28/2019, 05/29/2019   Tdap 11/06/2012   Zoster, Live 10/01/2015     Objective: Vital Signs: BP (!) 145/93   Pulse 70   Temp 97.9 F (36.6 C)   Resp 16   Ht 5' 2 (1.575 m)   Wt 175 lb 6.4 oz (79.6 kg)   LMP 02/06/1982   BMI 32.08 kg/m    Physical Exam Vitals and nursing note reviewed.  Constitutional:      Appearance: She is well-developed.  HENT:     Head: Normocephalic and atraumatic.  Eyes:     Conjunctiva/sclera: Conjunctivae normal.  Cardiovascular:     Rate and Rhythm: Normal rate and regular rhythm.     Heart sounds: Normal heart sounds.  Pulmonary:     Effort: Pulmonary effort is normal.     Breath sounds: Normal breath sounds.  Abdominal:     General: Bowel sounds are normal.     Palpations: Abdomen is soft.  Musculoskeletal:     Cervical back: Normal range of motion.  Lymphadenopathy:     Cervical: No cervical adenopathy.  Skin:    General: Skin is warm and dry.     Capillary Refill: Capillary refill takes less than 2 seconds.  Neurological:     Mental Status: She is alert and oriented to person, place, and time.  Psychiatric:        Behavior: Behavior normal.      Musculoskeletal Exam: Cervical, thoracic and lumbar spine were in good range of motion.  There was no SI joint tenderness.  Shoulder joints, elbow joints, wrist joints, MCPs, PIPs and DIPs were in good range of motion with no synovitis.  Bilateral PIP and DIP thickening was noted.  Hip joints and knee joints were in good range of motion without any warmth swelling or effusion.   There was no tenderness over ankles or MTPs.  No Achilles tendinitis or plantar fasciitis was noted.  CDAI Exam: CDAI Score: -- Patient Global: --; Provider Global: -- Swollen: --; Tender: -- Joint Exam 12/04/2023   No joint exam has been documented for this visit   There is currently no information documented on the homunculus. Go to the Rheumatology activity and complete the homunculus joint exam.  Investigation: No additional  findings.  Imaging: No results found.  Recent Labs: Lab Results  Component Value Date   WBC 7.9 10/10/2023   HGB 14.2 10/10/2023   PLT 225 10/10/2023   NA 142 10/10/2023   K 3.9 10/10/2023   CL 105 10/10/2023   CO2 29 10/10/2023   GLUCOSE 76 10/10/2023   BUN 17 10/10/2023   CREATININE 0.89 10/10/2023   BILITOT 0.5 10/10/2023   ALKPHOS 82 08/07/2016   AST 16 10/10/2023   ALT 16 10/10/2023   PROT 6.8 10/10/2023   ALBUMIN 4.4 08/07/2016   CALCIUM 9.6 10/10/2023   GFRAA 85 07/26/2020   QFTBGOLDPLUS NEGATIVE 10/10/2023    Speciality Comments: Enbrel  discontinued August 2021-not covered by Medicare Cimzia - started 12/22/2019  Procedures:  No procedures performed Allergies: Patient has no known allergies.   Assessment / Plan:     Visit Diagnoses: Psoriatic arthropathy (HCC)-patient denies having a flare of psoriatic arthritis since the last visit.  She has some morning stiffness without any joint swelling.  There is no history of dactylitis or plantar fasciitis or Achilles tendinitis.  She is on combination of Cimzia  and methotrexate .  Psoriasis-no active psoriasis lesions were noted.  High risk medication use - Cimzia  400 mg sq injections every 28 days and Methotrexate  4 tablets by mouth once weekly.  Knee instability, right - she has been feeling some instability in her right knee joint.  I offered x-rays but she declined.  October 10, 2023 CBC and CMP were normal and TB Gold was negative.  Annual TB Gold was advised.  Labs were advised  every 3 months to monitor for drug toxicity.  Information reimmunization was placed in the AVS.  She was advised to hold Cimzia  if she develops an infection resume after infection resolves.  DDD (degenerative disc disease), cervical-she had good range of motion without discomfort.  Osteopenia of multiple sites - DEXA updated on 05/25/20: RFN BMD 0.645 with T-score -1.8.  Patient has not seen her GYN.  I advised her to schedule a follow-up appointment and also repeat DEXA scan.  Calcium rich diet and vitamin D  was advised.  Rest of exercise were advised.  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  History of restless legs syndrome  History of esophagitis  Orders: No orders of the defined types were placed in this encounter.  No orders of the defined types were placed in this encounter.    Follow-Up Instructions: Return in about 5 months (around 05/03/2024) for Psoriatic arthritis.   Maya Nash, MD  Note - This record has been created using Animal nutritionist.  Chart creation errors have been sought, but may not always  have been located. Such creation errors do not reflect on  the standard of medical care.

## 2023-11-26 ENCOUNTER — Telehealth: Payer: Self-pay | Admitting: Pharmacist

## 2023-11-26 NOTE — Telephone Encounter (Signed)
 Called patient to re-verify benefits for in-office Cimzia . She states she is planning to keep her [employer] supplemental insurance plan. Patient opted into the annual reverification via [employer] portal  Sherry Pennant, PharmD, MPH, BCPS, CPP Clinical Pharmacist [facility])

## 2023-11-29 ENCOUNTER — Ambulatory Visit: Attending: Rheumatology

## 2023-11-29 VITALS — BP 142/87 | HR 82

## 2023-11-29 DIAGNOSIS — L4050 Arthropathic psoriasis, unspecified: Secondary | ICD-10-CM | POA: Diagnosis present

## 2023-11-29 MED ORDER — CERTOLIZUMAB PEGOL 2 X 200 MG ~~LOC~~ KIT
400.0000 mg | PACK | Freq: Once | SUBCUTANEOUS | Status: AC
  Administered 2023-11-29: 400 mg via SUBCUTANEOUS

## 2023-11-29 NOTE — Progress Notes (Signed)
 Subjective:   Patient presents to clinic today to receive monthly dose of Cimzia .  Patient running a fever or have signs/symptoms of infection? No  Patient currently on antibiotics for the treatment of infection? No  Patient have any upcoming invasive procedures/surgeries? No  Objective: CMP     Component Value Date/Time   NA 142 10/10/2023 1203   K 3.9 10/10/2023 1203   CL 105 10/10/2023 1203   CO2 29 10/10/2023 1203   GLUCOSE 76 10/10/2023 1203   BUN 17 10/10/2023 1203   CREATININE 0.89 10/10/2023 1203   CALCIUM 9.6 10/10/2023 1203   PROT 6.8 10/10/2023 1203   ALBUMIN 4.4 08/07/2016 0911   AST 16 10/10/2023 1203   ALT 16 10/10/2023 1203   ALKPHOS 82 08/07/2016 0911   BILITOT 0.5 10/10/2023 1203   GFRNONAA 73 07/26/2020 1421   GFRAA 85 07/26/2020 1421    CBC    Component Value Date/Time   WBC 7.9 10/10/2023 1203   RBC 4.79 10/10/2023 1203   HGB 14.2 10/10/2023 1203   HGB 14.4 11/25/2013 0943   HCT 41.9 10/10/2023 1203   PLT 225 10/10/2023 1203   MCV 87.5 10/10/2023 1203   MCH 29.6 10/10/2023 1203   MCHC 33.9 10/10/2023 1203   RDW 14.0 10/10/2023 1203   LYMPHSABS 2,915 09/13/2022 1416   MONOABS 560 08/07/2016 0911   EOSABS 103 10/10/2023 1203   BASOSABS 63 10/10/2023 1203    Baseline Immunosuppressant Therapy Labs TB GOLD    Latest Ref Rng & Units 10/10/2023   12:03 PM  Quantiferon TB Gold  Quantiferon TB Gold Plus NEGATIVE NEGATIVE    Hepatitis Panel    Latest Ref Rng & Units 08/07/2016    9:11 AM  Hepatitis  Hep B Surface Ag NEGATIVE NEGATIVE   Hep B IgM NON REACTIVE NON REACTIVE   Hep C Ab NEGATIVE NEGATIVE    HIV Lab Results  Component Value Date   HIV NONREACTIVE 08/07/2016   Immunoglobulins    Latest Ref Rng & Units 08/07/2016    9:11 AM  Immunoglobulin Electrophoresis  IgG 694 - 1,618 mg/dL 8,885   IgM 48 - 728 mg/dL 848    SPEP    Latest Ref Rng & Units 10/10/2023   12:03 PM  Serum Protein Electrophoresis  Total Protein 6.1 - 8.1  g/dL 6.8    H3EI No results found for: G6PDH TPMT No results found for: TPMT   Chest x-ray: 02/03/2011 No active disease   Assessment/Plan:   Administrations This Visit     certolizumab pegol  (CIMZIA ) kit 400 mg     Admin Date 11/29/2023 Action Given Dose 400 mg Route Subcutaneous Documented By Burl Francina HERO, CMA             Patient tolerated injection well.   Appointment for next injection scheduled for 12/27/2023.  Patient due for labs in December 2025.  Patient is to call and reschedule appointment if running a fever with signs/symptoms of infection, on antibiotics for active infection or has an upcoming invasive procedure.  All questions encouraged and answered.  Instructed patient to call with any further questions or concerns.

## 2023-12-04 ENCOUNTER — Ambulatory Visit: Attending: Rheumatology | Admitting: Rheumatology

## 2023-12-04 ENCOUNTER — Encounter: Payer: Self-pay | Admitting: Rheumatology

## 2023-12-04 VITALS — BP 145/93 | HR 70 | Temp 97.9°F | Resp 16 | Ht 62.0 in | Wt 175.4 lb

## 2023-12-04 DIAGNOSIS — M5030 Other cervical disc degeneration, unspecified cervical region: Secondary | ICD-10-CM | POA: Insufficient documentation

## 2023-12-04 DIAGNOSIS — M25361 Other instability, right knee: Secondary | ICD-10-CM | POA: Insufficient documentation

## 2023-12-04 DIAGNOSIS — L4050 Arthropathic psoriasis, unspecified: Secondary | ICD-10-CM | POA: Diagnosis not present

## 2023-12-04 DIAGNOSIS — M8589 Other specified disorders of bone density and structure, multiple sites: Secondary | ICD-10-CM | POA: Diagnosis present

## 2023-12-04 DIAGNOSIS — Z8719 Personal history of other diseases of the digestive system: Secondary | ICD-10-CM | POA: Diagnosis present

## 2023-12-04 DIAGNOSIS — L409 Psoriasis, unspecified: Secondary | ICD-10-CM | POA: Diagnosis present

## 2023-12-04 DIAGNOSIS — Z8669 Personal history of other diseases of the nervous system and sense organs: Secondary | ICD-10-CM | POA: Diagnosis present

## 2023-12-04 DIAGNOSIS — Z79899 Other long term (current) drug therapy: Secondary | ICD-10-CM | POA: Diagnosis present

## 2023-12-04 DIAGNOSIS — F439 Reaction to severe stress, unspecified: Secondary | ICD-10-CM | POA: Diagnosis present

## 2023-12-04 NOTE — Patient Instructions (Signed)
 Standing Labs We placed an order today for your standing lab work.   Please have your standing labs drawn in January and 3 months  Please have your labs drawn 2 weeks prior to your appointment so that the provider can discuss your lab results at your appointment, if possible.  Please note that you may see your imaging and lab results in MyChart before we have reviewed them. We will contact you once all results are reviewed. Please allow our office up to 72 hours to thoroughly review all of the results before contacting the office for clarification of your results.  WALK-IN LAB HOURS  Monday through Thursday from 8:00 am -12:30 pm and 1:00 pm-4:30 pm and Friday from 8:00 am-12:00 pm.  Patients with office visits requiring labs will be seen before walk-in labs.  You may encounter longer than normal wait times. Please allow additional time. Wait times may be shorter on  Monday and Thursday afternoons.  We do not book appointments for walk-in labs. We appreciate your patience and understanding with our staff.   Labs are drawn by [facility]. Please bring your co-pay at the time of your lab draw.  You may receive a bill from [facility] for your lab work.  Please note if you are on Hydroxychloroquine and and an order has been placed for a Hydroxychloroquine level,  you will need to have it drawn 4 hours or more after your last dose.  If you wish to have your labs drawn at another location, please call the office 24 hours in advance so we can fax the orders.  The office is located at [address], [place], [place] [address]   If you have any questions regarding directions or hours of operation,  please call [phone].   As a reminder, please drink plenty of water prior to coming for your lab work. Thanks!   Vaccines You are taking a medication(s) that can suppress your immune system.  The following immunizations are recommended: Flu annually ( high dose) Covid-19  RSV Td/Tdap  (tetanus, diphtheria, pertussis) every 10 years Pneumonia (Prevnar 15 then Pneumovax 23 at least 1 year apart.  Alternatively, can take Prevnar 20 without needing additional dose) Shingrix: 2 doses from 4 weeks to 6 months apart  Please check with your PCP to make sure you are up to date.   If you have signs or symptoms of an infection or start antibiotics: First, call your PCP for workup of your infection. Hold your medication through the infection, until you complete your antibiotics, and until symptoms resolve if you take the following: Injectable medication (Actemra, Benlysta, Cimzia , Cosentyx, Enbrel , Humira, Kevzara, Orencia, Remicade, Simponi, Stelara, Taltz, Tremfya) Methotrexate  Leflunomide (Arava) Mycophenolate (Cellcept) Earma, Olumiant, or Rinvoq

## 2023-12-27 ENCOUNTER — Ambulatory Visit

## 2023-12-28 ENCOUNTER — Telehealth: Payer: Self-pay | Admitting: *Deleted

## 2023-12-28 NOTE — Telephone Encounter (Signed)
 Patient scheduled for 01/02/2024 for Cimzia 

## 2023-12-28 NOTE — Telephone Encounter (Signed)
 Patient contacted the office and left message stating she would ike to reschedule her appointment for Cimzia .  Attempted to contact the patient and left message for patient to call the office.

## 2024-01-02 ENCOUNTER — Ambulatory Visit: Attending: Rheumatology | Admitting: *Deleted

## 2024-01-02 VITALS — BP 122/80 | HR 102

## 2024-01-02 DIAGNOSIS — L4050 Arthropathic psoriasis, unspecified: Secondary | ICD-10-CM | POA: Diagnosis present

## 2024-01-02 MED ORDER — CERTOLIZUMAB PEGOL 2 X 200 MG ~~LOC~~ KIT
400.0000 mg | PACK | Freq: Once | SUBCUTANEOUS | Status: AC
  Administered 2024-01-02: 400 mg via SUBCUTANEOUS

## 2024-01-02 NOTE — Progress Notes (Signed)
 Subjective:   Patient presents to clinic today to receive monthly dose of Cimzia .  Patient running a fever or have signs/symptoms of infection? No  Patient currently on antibiotics for the treatment of infection? No  Patient have any upcoming invasive procedures/surgeries? No  Objective: CMP     Component Value Date/Time   NA 142 10/10/2023 1203   K 3.9 10/10/2023 1203   CL 105 10/10/2023 1203   CO2 29 10/10/2023 1203   GLUCOSE 76 10/10/2023 1203   BUN 17 10/10/2023 1203   CREATININE 0.89 10/10/2023 1203   CALCIUM 9.6 10/10/2023 1203   PROT 6.8 10/10/2023 1203   ALBUMIN 4.4 08/07/2016 0911   AST 16 10/10/2023 1203   ALT 16 10/10/2023 1203   ALKPHOS 82 08/07/2016 0911   BILITOT 0.5 10/10/2023 1203   GFRNONAA 73 07/26/2020 1421   GFRAA 85 07/26/2020 1421    CBC    Component Value Date/Time   WBC 7.9 10/10/2023 1203   RBC 4.79 10/10/2023 1203   HGB 14.2 10/10/2023 1203   HGB 14.4 11/25/2013 0943   HCT 41.9 10/10/2023 1203   PLT 225 10/10/2023 1203   MCV 87.5 10/10/2023 1203   MCH 29.6 10/10/2023 1203   MCHC 33.9 10/10/2023 1203   RDW 14.0 10/10/2023 1203   LYMPHSABS 2,915 09/13/2022 1416   MONOABS 560 08/07/2016 0911   EOSABS 103 10/10/2023 1203   BASOSABS 63 10/10/2023 1203    Baseline Immunosuppressant Therapy Labs TB GOLD    Latest Ref Rng & Units 10/10/2023   12:03 PM  Quantiferon TB Gold  Quantiferon TB Gold Plus NEGATIVE NEGATIVE    Hepatitis Panel    Latest Ref Rng & Units 08/07/2016    9:11 AM  Hepatitis  Hep B Surface Ag NEGATIVE NEGATIVE   Hep B IgM NON REACTIVE NON REACTIVE   Hep C Ab NEGATIVE NEGATIVE    HIV Lab Results  Component Value Date   HIV NONREACTIVE 08/07/2016   Immunoglobulins    Latest Ref Rng & Units 08/07/2016    9:11 AM  Immunoglobulin Electrophoresis  IgG 694 - 1,618 mg/dL 8,885   IgM 48 - 728 mg/dL 848    SPEP    Latest Ref Rng & Units 10/10/2023   12:03 PM  Serum Protein Electrophoresis  Total Protein 6.1 - 8.1  g/dL 6.8    H3EI No results found for: G6PDH TPMT No results found for: TPMT   Chest x-ray: 02/03/2011 No active disease   Assessment/Plan:   Administrations This Visit     certolizumab pegol  (CIMZIA ) kit 400 mg     Admin Date 01/02/2024 Action Given Dose 400 mg Route Subcutaneous Documented By Cena Alfonso CROME, LPN             Patient tolerated injection well.   Appointment for next injection scheduled for 02/06/2024.  Patient due for labs in December 2025.  Patient is to call and reschedule appointment if running a fever with signs/symptoms of infection, on antibiotics for active infection or has an upcoming invasive procedure.  All questions encouraged and answered.  Instructed patient to call with any further questions or concerns.

## 2024-02-06 ENCOUNTER — Ambulatory Visit

## 2024-02-06 VITALS — BP 154/91 | HR 78 | Temp 97.4°F

## 2024-02-06 DIAGNOSIS — L4050 Arthropathic psoriasis, unspecified: Secondary | ICD-10-CM | POA: Insufficient documentation

## 2024-02-06 MED ORDER — CERTOLIZUMAB PEGOL 2 X 200 MG ~~LOC~~ KIT
400.0000 mg | PACK | Freq: Once | SUBCUTANEOUS | Status: AC
  Administered 2024-02-06: 400 mg via SUBCUTANEOUS

## 2024-02-06 NOTE — Progress Notes (Signed)
 Subjective:   Patient presents to clinic today to receive monthly dose of Cimzia .  Patient running a fever or have signs/symptoms of infection? No  Patient currently on antibiotics for the treatment of infection? No  Patient have any upcoming invasive procedures/surgeries? No  Objective: CMP     Component Value Date/Time   NA 142 10/10/2023 1203   K 3.9 10/10/2023 1203   CL 105 10/10/2023 1203   CO2 29 10/10/2023 1203   GLUCOSE 76 10/10/2023 1203   BUN 17 10/10/2023 1203   CREATININE 0.89 10/10/2023 1203   CALCIUM 9.6 10/10/2023 1203   PROT 6.8 10/10/2023 1203   ALBUMIN 4.4 08/07/2016 0911   AST 16 10/10/2023 1203   ALT 16 10/10/2023 1203   ALKPHOS 82 08/07/2016 0911   BILITOT 0.5 10/10/2023 1203   GFRNONAA 73 07/26/2020 1421   GFRAA 85 07/26/2020 1421    CBC    Component Value Date/Time   WBC 7.9 10/10/2023 1203   RBC 4.79 10/10/2023 1203   HGB 14.2 10/10/2023 1203   HGB 14.4 11/25/2013 0943   HCT 41.9 10/10/2023 1203   PLT 225 10/10/2023 1203   MCV 87.5 10/10/2023 1203   MCH 29.6 10/10/2023 1203   MCHC 33.9 10/10/2023 1203   RDW 14.0 10/10/2023 1203   LYMPHSABS 2,915 09/13/2022 1416   MONOABS 560 08/07/2016 0911   EOSABS 103 10/10/2023 1203   BASOSABS 63 10/10/2023 1203    Baseline Immunosuppressant Therapy Labs TB GOLD    Latest Ref Rng & Units 10/10/2023   12:03 PM  Quantiferon TB Gold  Quantiferon TB Gold Plus NEGATIVE NEGATIVE    Hepatitis Panel    Latest Ref Rng & Units 08/07/2016    9:11 AM  Hepatitis  Hep B Surface Ag NEGATIVE NEGATIVE   Hep B IgM NON REACTIVE NON REACTIVE   Hep C Ab NEGATIVE NEGATIVE    HIV Lab Results  Component Value Date   HIV NONREACTIVE 08/07/2016   Immunoglobulins    Latest Ref Rng & Units 08/07/2016    9:11 AM  Immunoglobulin Electrophoresis  IgG 694 - 1,618 mg/dL 8,885   IgM 48 - 728 mg/dL 848    SPEP    Latest Ref Rng & Units 10/10/2023   12:03 PM  Serum Protein Electrophoresis  Total Protein 6.1 - 8.1  g/dL 6.8    H3EI No results found for: G6PDH TPMT No results found for: TPMT   Chest x-ray: 02/03/2011 No active disease   Assessment/Plan:   Administrations This Visit     certolizumab pegol  (CIMZIA ) kit 400 mg     Admin Date 02/06/2024 Action Given Dose 400 mg Route Subcutaneous Documented By Burl Francina HERO, CMA             Patient tolerated injection well.   Appointment for next injection scheduled for January 28th, 2PM.  Patient due for labs in January.  Patient is to call and reschedule appointment if running a fever with signs/symptoms of infection, on antibiotics for active infection or has an upcoming invasive procedure.  All questions encouraged and answered.  Instructed patient to call with any further questions or concerns.

## 2024-02-11 ENCOUNTER — Other Ambulatory Visit: Payer: Self-pay

## 2024-02-11 DIAGNOSIS — Z79899 Other long term (current) drug therapy: Secondary | ICD-10-CM

## 2024-02-12 ENCOUNTER — Ambulatory Visit: Payer: Self-pay | Admitting: Rheumatology

## 2024-02-12 LAB — COMPREHENSIVE METABOLIC PANEL WITH GFR
AG Ratio: 1.5 (calc) (ref 1.0–2.5)
ALT: 19 U/L (ref 6–29)
AST: 16 U/L (ref 10–35)
Albumin: 4.3 g/dL (ref 3.6–5.1)
Alkaline phosphatase (APISO): 68 U/L (ref 37–153)
BUN: 13 mg/dL (ref 7–25)
CO2: 28 mmol/L (ref 20–32)
Calcium: 9.7 mg/dL (ref 8.6–10.4)
Chloride: 106 mmol/L (ref 98–110)
Creat: 0.82 mg/dL (ref 0.50–1.05)
Globulin: 2.9 g/dL (ref 1.9–3.7)
Glucose, Bld: 86 mg/dL (ref 65–99)
Potassium: 4.1 mmol/L (ref 3.5–5.3)
Sodium: 144 mmol/L (ref 135–146)
Total Bilirubin: 0.5 mg/dL (ref 0.2–1.2)
Total Protein: 7.2 g/dL (ref 6.1–8.1)
eGFR: 77 mL/min/1.73m2

## 2024-02-12 LAB — CBC WITH DIFFERENTIAL/PLATELET
Absolute Lymphocytes: 2279 {cells}/uL (ref 850–3900)
Absolute Monocytes: 540 {cells}/uL (ref 200–950)
Basophils Absolute: 43 {cells}/uL (ref 0–200)
Basophils Relative: 0.6 %
Eosinophils Absolute: 99 {cells}/uL (ref 15–500)
Eosinophils Relative: 1.4 %
HCT: 45.6 % (ref 35.9–46.0)
Hemoglobin: 15 g/dL (ref 11.7–15.5)
MCH: 28.7 pg (ref 27.0–33.0)
MCHC: 32.9 g/dL (ref 31.6–35.4)
MCV: 87.4 fL (ref 81.4–101.7)
MPV: 11.6 fL (ref 7.5–12.5)
Monocytes Relative: 7.6 %
Neutro Abs: 4139 {cells}/uL (ref 1500–7800)
Neutrophils Relative %: 58.3 %
Platelets: 224 Thousand/uL (ref 140–400)
RBC: 5.22 Million/uL — ABNORMAL HIGH (ref 3.80–5.10)
RDW: 13.4 % (ref 11.0–15.0)
Total Lymphocyte: 32.1 %
WBC: 7.1 Thousand/uL (ref 3.8–10.8)

## 2024-02-12 NOTE — Progress Notes (Signed)
 CBC and CMP are stable.

## 2024-02-13 NOTE — Telephone Encounter (Signed)
 Received fax for Cimzia  reverification of benefits   Patient has Medicare A/B + [employer] supplemental plan. Medicare will cover 80% of cost with 20& coinsurance. [employer] will cover [id number] as long as Medicare pays. Supplemental plan is active. 20% coinsurance will be paid by [facility] after Part B deductible is met   CPT codes 03627 and [phone] are valid and billable.  Case # [id number]   Sherry Pennant, PharmD, MPH, BCPS, CPP Clinical Pharmacist

## 2024-03-05 ENCOUNTER — Ambulatory Visit: Attending: Rheumatology

## 2024-03-05 VITALS — BP 115/74 | HR 74 | Temp 98.3°F

## 2024-03-05 DIAGNOSIS — L4050 Arthropathic psoriasis, unspecified: Secondary | ICD-10-CM | POA: Insufficient documentation

## 2024-03-05 MED ORDER — CERTOLIZUMAB PEGOL 2 X 200 MG ~~LOC~~ KIT
400.0000 mg | PACK | Freq: Once | SUBCUTANEOUS | Status: AC
  Administered 2024-03-05: 400 mg via SUBCUTANEOUS

## 2024-03-05 NOTE — Progress Notes (Addendum)
 Subjective:   Patient presents to clinic today to receive monthly dose of Cimzia .  Patient running a fever or have signs/symptoms of infection? No  Patient currently on antibiotics for the treatment of infection? No  Patient have any upcoming invasive procedures/surgeries? No  Objective: CMP     Component Value Date/Time   NA 144 02/11/2024 1140   K 4.1 02/11/2024 1140   CL 106 02/11/2024 1140   CO2 28 02/11/2024 1140   GLUCOSE 86 02/11/2024 1140   BUN 13 02/11/2024 1140   CREATININE 0.82 02/11/2024 1140   CALCIUM 9.7 02/11/2024 1140   PROT 7.2 02/11/2024 1140   ALBUMIN 4.4 08/07/2016 0911   AST 16 02/11/2024 1140   ALT 19 02/11/2024 1140   ALKPHOS 82 08/07/2016 0911   BILITOT 0.5 02/11/2024 1140   GFRNONAA 73 07/26/2020 1421   GFRAA 85 07/26/2020 1421    CBC    Component Value Date/Time   WBC 7.1 02/11/2024 1140   RBC 5.22 (H) 02/11/2024 1140   HGB 15.0 02/11/2024 1140   HGB 14.4 11/25/2013 0943   HCT 45.6 02/11/2024 1140   PLT 224 02/11/2024 1140   MCV 87.4 02/11/2024 1140   MCH 28.7 02/11/2024 1140   MCHC 32.9 02/11/2024 1140   RDW 13.4 02/11/2024 1140   LYMPHSABS 2,915 09/13/2022 1416   MONOABS 560 08/07/2016 0911   EOSABS 99 02/11/2024 1140   BASOSABS 43 02/11/2024 1140    Baseline Immunosuppressant Therapy Labs TB GOLD    Latest Ref Rng & Units 10/10/2023   12:03 PM  Quantiferon TB Gold  Quantiferon TB Gold Plus NEGATIVE NEGATIVE    Hepatitis Panel    Latest Ref Rng & Units 08/07/2016    9:11 AM  Hepatitis  Hep B Surface Ag NEGATIVE NEGATIVE   Hep B IgM NON REACTIVE NON REACTIVE   Hep C Ab NEGATIVE NEGATIVE    HIV Lab Results  Component Value Date   HIV NONREACTIVE 08/07/2016   Immunoglobulins    Latest Ref Rng & Units 08/07/2016    9:11 AM  Immunoglobulin Electrophoresis  IgG 694 - 1,618 mg/dL 8,885   IgM 48 - 728 mg/dL 848    SPEP    Latest Ref Rng & Units 02/11/2024   11:40 AM  Serum Protein Electrophoresis  Total Protein 6.1 - 8.1  g/dL 7.2    H3EI No results found for: G6PDH TPMT No results found for: TPMT   Chest x-ray: 02/03/2011 No active disease   Assessment/Plan:   Administrations This Visit     certolizumab pegol  (CIMZIA ) kit 400 mg     Admin Date 03/05/2024 Action Given Dose 400 mg Route Subcutaneous Documented By Burl Francina HERO, CMA             Patient tolerated injection  well.   Appointment for next injection scheduled for April 02, 2024.  Patient due for labs in April.  Patient is to call and reschedule appointment if running a fever with signs/symptoms of infection, on antibiotics for active infection or has an upcoming invasive procedure.  All questions encouraged and answered.  Instructed patient to call with any further questions or concerns.

## 2024-04-02 ENCOUNTER — Ambulatory Visit

## 2024-05-08 ENCOUNTER — Ambulatory Visit: Admitting: Rheumatology
